# Patient Record
Sex: Female | Born: 1945
Health system: Southern US, Community
[De-identification: ages and names within clinical notes are randomized; demographics above are authoritative.]

## PROBLEM LIST (undated history)

## (undated) DIAGNOSIS — R011 Cardiac murmur, unspecified: Secondary | ICD-10-CM

## (undated) DIAGNOSIS — M1990 Unspecified osteoarthritis, unspecified site: Secondary | ICD-10-CM

## (undated) DIAGNOSIS — T7840XA Allergy, unspecified, initial encounter: Secondary | ICD-10-CM

## (undated) DIAGNOSIS — E785 Hyperlipidemia, unspecified: Secondary | ICD-10-CM

## (undated) DIAGNOSIS — I10 Essential (primary) hypertension: Secondary | ICD-10-CM

## (undated) DIAGNOSIS — F419 Anxiety disorder, unspecified: Secondary | ICD-10-CM

## (undated) HISTORY — PX: ABDOMINAL HYSTERECTOMY: SHX81

## (undated) HISTORY — DX: Allergy, unspecified, initial encounter: T78.40XA

## (undated) HISTORY — DX: Essential (primary) hypertension: I10

## (undated) HISTORY — PX: CATARACT EXTRACTION, BILATERAL: SHX1313

## (undated) HISTORY — DX: Hyperlipidemia, unspecified: E78.5

## (undated) HISTORY — DX: Cardiac murmur, unspecified: R01.1

## (undated) HISTORY — PX: COLONOSCOPY: SHX174

---

## 1998-09-08 ENCOUNTER — Encounter: Payer: Self-pay | Admitting: Family Medicine

## 1998-09-08 ENCOUNTER — Ambulatory Visit (HOSPITAL_COMMUNITY): Admission: RE | Admit: 1998-09-08 | Discharge: 1998-09-08 | Payer: Self-pay | Admitting: Family Medicine

## 1998-12-19 ENCOUNTER — Ambulatory Visit (HOSPITAL_COMMUNITY): Admission: RE | Admit: 1998-12-19 | Discharge: 1998-12-19 | Payer: Self-pay | Admitting: Family Medicine

## 1998-12-20 ENCOUNTER — Encounter: Payer: Self-pay | Admitting: Family Medicine

## 1999-01-25 ENCOUNTER — Ambulatory Visit (HOSPITAL_COMMUNITY): Admission: RE | Admit: 1999-01-25 | Discharge: 1999-01-25 | Payer: Self-pay | Admitting: Obstetrics and Gynecology

## 2000-02-07 ENCOUNTER — Encounter: Payer: Self-pay | Admitting: Family Medicine

## 2000-02-07 ENCOUNTER — Encounter: Admission: RE | Admit: 2000-02-07 | Discharge: 2000-02-07 | Payer: Self-pay | Admitting: Family Medicine

## 2000-09-02 HISTORY — PX: OTHER SURGICAL HISTORY: SHX169

## 2000-09-02 HISTORY — PX: PARTIAL HYSTERECTOMY: SHX80

## 2000-09-10 ENCOUNTER — Encounter (INDEPENDENT_AMBULATORY_CARE_PROVIDER_SITE_OTHER): Payer: Self-pay

## 2000-09-10 ENCOUNTER — Inpatient Hospital Stay (HOSPITAL_COMMUNITY): Admission: RE | Admit: 2000-09-10 | Discharge: 2000-09-12 | Payer: Self-pay | Admitting: Obstetrics and Gynecology

## 2001-02-26 ENCOUNTER — Encounter: Payer: Self-pay | Admitting: Family Medicine

## 2001-02-26 ENCOUNTER — Encounter: Admission: RE | Admit: 2001-02-26 | Discharge: 2001-02-26 | Payer: Self-pay | Admitting: Family Medicine

## 2002-03-09 ENCOUNTER — Encounter: Payer: Self-pay | Admitting: Family Medicine

## 2002-03-09 ENCOUNTER — Encounter: Admission: RE | Admit: 2002-03-09 | Discharge: 2002-03-09 | Payer: Self-pay | Admitting: Family Medicine

## 2003-05-25 ENCOUNTER — Encounter: Admission: RE | Admit: 2003-05-25 | Discharge: 2003-05-25 | Payer: Self-pay | Admitting: Obstetrics and Gynecology

## 2003-05-25 ENCOUNTER — Encounter: Payer: Self-pay | Admitting: Family Medicine

## 2003-05-25 ENCOUNTER — Encounter: Admission: RE | Admit: 2003-05-25 | Discharge: 2003-05-25 | Payer: Self-pay | Admitting: Family Medicine

## 2003-05-25 ENCOUNTER — Encounter: Payer: Self-pay | Admitting: Obstetrics and Gynecology

## 2005-02-28 ENCOUNTER — Encounter: Admission: RE | Admit: 2005-02-28 | Discharge: 2005-02-28 | Payer: Self-pay | Admitting: Family Medicine

## 2006-04-04 ENCOUNTER — Encounter: Admission: RE | Admit: 2006-04-04 | Discharge: 2006-04-04 | Payer: Self-pay | Admitting: Family Medicine

## 2007-05-05 ENCOUNTER — Encounter: Admission: RE | Admit: 2007-05-05 | Discharge: 2007-05-05 | Payer: Self-pay | Admitting: Obstetrics and Gynecology

## 2008-05-17 ENCOUNTER — Encounter: Admission: RE | Admit: 2008-05-17 | Discharge: 2008-05-17 | Payer: Self-pay | Admitting: Family Medicine

## 2008-05-26 ENCOUNTER — Ambulatory Visit (HOSPITAL_BASED_OUTPATIENT_CLINIC_OR_DEPARTMENT_OTHER): Admission: RE | Admit: 2008-05-26 | Discharge: 2008-05-26 | Payer: Self-pay | Admitting: Orthopedic Surgery

## 2008-06-21 ENCOUNTER — Encounter (INDEPENDENT_AMBULATORY_CARE_PROVIDER_SITE_OTHER): Payer: Self-pay | Admitting: Orthopedic Surgery

## 2008-06-21 ENCOUNTER — Ambulatory Visit (HOSPITAL_BASED_OUTPATIENT_CLINIC_OR_DEPARTMENT_OTHER): Admission: RE | Admit: 2008-06-21 | Discharge: 2008-06-21 | Payer: Self-pay | Admitting: Orthopedic Surgery

## 2010-10-10 LAB — HM COLONOSCOPY

## 2010-11-08 ENCOUNTER — Other Ambulatory Visit: Payer: Self-pay | Admitting: Family Medicine

## 2010-11-08 ENCOUNTER — Ambulatory Visit (INDEPENDENT_AMBULATORY_CARE_PROVIDER_SITE_OTHER): Payer: 59 | Admitting: Family Medicine

## 2010-11-08 ENCOUNTER — Encounter: Payer: Self-pay | Admitting: Family Medicine

## 2010-11-08 DIAGNOSIS — I10 Essential (primary) hypertension: Secondary | ICD-10-CM

## 2010-11-08 DIAGNOSIS — E785 Hyperlipidemia, unspecified: Secondary | ICD-10-CM | POA: Insufficient documentation

## 2010-11-08 DIAGNOSIS — J309 Allergic rhinitis, unspecified: Secondary | ICD-10-CM | POA: Insufficient documentation

## 2010-11-08 LAB — BASIC METABOLIC PANEL
BUN: 12 mg/dL (ref 6–23)
CO2: 33 mEq/L — ABNORMAL HIGH (ref 19–32)
Calcium: 9.4 mg/dL (ref 8.4–10.5)
Chloride: 96 mEq/L (ref 96–112)
Creatinine, Ser: 0.8 mg/dL (ref 0.4–1.2)
GFR: 82.49 mL/min (ref >60.00)
Glucose, Bld: 96 mg/dL (ref 70–99)
Potassium: 3 mEq/L — ABNORMAL LOW (ref 3.5–5.1)
Sodium: 139 mEq/L (ref 135–145)

## 2010-11-08 LAB — LIPID PANEL
Cholesterol: 159 mg/dL (ref 0–200)
HDL: 43.1 mg/dL (ref >39.00)
LDL Cholesterol: 89 mg/dL (ref 0–99)
Total CHOL/HDL Ratio: 4
Triglycerides: 134 mg/dL (ref 0.0–149.0)
VLDL: 26.8 mg/dL (ref 0.0–40.0)

## 2010-11-08 LAB — HEPATIC FUNCTION PANEL
ALT: 20 U/L (ref 0–35)
AST: 23 U/L (ref 0–37)
Albumin: 4.3 g/dL (ref 3.5–5.2)
Alkaline Phosphatase: 82 U/L (ref 39–117)
Bilirubin, Direct: 0.1 mg/dL (ref 0.0–0.3)
Total Bilirubin: 0.8 mg/dL (ref 0.3–1.2)
Total Protein: 7.7 g/dL (ref 6.0–8.3)

## 2010-11-13 NOTE — Assessment & Plan Note (Signed)
Summary: NEW PT TO EST/REFILL MED/CLE  [employer],MAILED NPP   Vital Signs:  Patient profile:   65 year old female Height:      61 inches Weight:      127.75 pounds BMI:     24.23 Temp:     97.8 degrees F oral Pulse rate:   77 / minute Pulse rhythm:   regular BP sitting:   140 / 80  (right arm) Cuff size:   regular  Vitals Entered By: Linde Gillis CMA Duncan Dull) 11/26/10 1:50 PM) CC: new patient, establish care   History of Present Illness: 65 yo here to establish care, needs medications refilled.  HTN- Has been on HCTZ 25 mg and Norvasc 5 mg daily for sevearl months.  Also takes Klor Con 20 meq. Denies any CP, SOB, LE edema, blurred vision.  HLD- on lipitor 20 mg daily.  No myalgias or other noticible side effects.    Preventive Screening-Counseling & Management  Alcohol-Tobacco     Smoking Status: never      Drug Use:  no.    Current Medications (verified): 1)  Lipitor 20 Mg Tabs (Atorvastatin Calcium) .... Take One Tablet By Mouth Daily 2)  Norvasc 5 Mg Tabs (Amlodipine Besylate) .... Take One Tablet By Mouth Daily 3)  Klor-Con 20 Meq Pack (Potassium Chloride) .... Take One Tablet By Mouth Daily 4)  Hydrochlorothiazide 25 Mg Tabs (Hydrochlorothiazide) .... Take One Tablet By Mouth Daily 5)  Zyrtec Allergy 10 Mg Tabs (Cetirizine Hcl) .... Take One Tablet By Mouth Daily 6)  Centrum Silver  Tabs (Multiple Vitamins-Minerals) .... Take One Tablet By Mouth Daily  Allergies (verified): 1)  ! * Lisinopril  Past History:  Family History: Last updated: 11-26-2010 Brother died of MI Family History Hypertension  Social History: Last updated: 11-26-10 Married One daughter Never Smoked Alcohol use-no Drug use-no  Risk Factors: Smoking Status: never (11/26/10)  Past Medical History: Allergic rhinitis Hyperlipidemia Hypertension  Past Surgical History: Hysterectomy 2002  Family History: Brother died of MI Family History Hypertension  Social  History: Married One daughter Never Smoked Alcohol use-no Drug use-no Smoking Status:  never Drug Use:  no  Review of Systems      See HPI General:  Denies malaise. Eyes:  Denies blurring. ENT:  Denies difficulty swallowing. CV:  Denies chest pain or discomfort. Resp:  Denies shortness of breath. GI:  Denies abdominal pain, bloody stools, and change in bowel habits. GU:  Denies dysuria. MS:  Denies joint pain, joint redness, and joint swelling. Derm:  Denies rash. Neuro:  Denies headaches. Psych:  Denies anxiety and depression. Endo:  Denies cold intolerance and heat intolerance. Heme:  Denies abnormal bruising and bleeding.   Impression & Recommendations:  Problem # 1:  HYPERTENSION (ICD-401.9) Assessment Deteriorated A little elevated today but rushed to get here. Brother also died recently, per pt typically runs in 120s/60s. BMET today. Her updated medication list for this problem includes:    Norvasc 5 Mg Tabs (Amlodipine besylate) .Marland Kitchen... Take one tablet by mouth daily    Hydrochlorothiazide 25 Mg Tabs (Hydrochlorothiazide) .Marland Kitchen... Take one tablet by mouth daily  Orders: TLB-BMP (Basic Metabolic Panel-BMET) (80048-METABOL)  Problem # 2:  HYPERLIPIDEMIA (ICD-272.4) Assessment: Unchanged recheck FLP and hepatic panel today. Continue lipitor 20 mg daily. Her updated medication list for this problem includes:    Lipitor 20 Mg Tabs (Atorvastatin calcium) .Marland Kitchen... Take one tablet by mouth daily  Orders: Venipuncture (28413) TLB-Lipid Panel (80061-LIPID) TLB-Hepatic/Liver Function Pnl (80076-HEPATIC)  Problem # 3:  ALLERGIC RHINITIS (ICD-477.9) Assessment: Unchanged stable on Zyrtec 10 mg daily. Her updated medication list for this problem includes:    Zyrtec Allergy 10 Mg Tabs (Cetirizine hcl) .Marland Kitchen... Take one tablet by mouth daily  Complete Medication List: 1)  Lipitor 20 Mg Tabs (Atorvastatin calcium) .... Take one tablet by mouth daily 2)  Norvasc 5 Mg Tabs  (Amlodipine besylate) .... Take one tablet by mouth daily 3)  Klor-con 20 Meq Pack (Potassium chloride) .... Take one tablet by mouth daily 4)  Hydrochlorothiazide 25 Mg Tabs (Hydrochlorothiazide) .... Take one tablet by mouth daily 5)  Zyrtec Allergy 10 Mg Tabs (Cetirizine hcl) .... Take one tablet by mouth daily 6)  Centrum Silver Tabs (Multiple vitamins-minerals) .... Take one tablet by mouth daily Prescriptions: KLOR-CON 20 MEQ PACK (POTASSIUM CHLORIDE) take one tablet by mouth daily  #90 x 4   Entered and Authorized by:   Ruthe Mannan MD   Signed by:   Ruthe Mannan MD on 11/08/2010   Method used:   Print then Give to Patient   RxID:   [id number] HYDROCHLOROTHIAZIDE 25 MG TABS (HYDROCHLOROTHIAZIDE) take one tablet by mouth daily  #90 x 4   Entered and Authorized by:   Ruthe Mannan MD   Signed by:   Ruthe Mannan MD on 11/08/2010   Method used:   Print then Give to Patient   RxID:   [id number] LIPITOR 20 MG TABS (ATORVASTATIN CALCIUM) take one tablet by mouth daily  #90 x 4   Entered and Authorized by:   Ruthe Mannan MD   Signed by:   Ruthe Mannan MD on 11/08/2010   Method used:   Print then Give to Patient   RxID:   [id number] NORVASC 5 MG TABS (AMLODIPINE BESYLATE) take one tablet by mouth daily  #90 x 4   Entered and Authorized by:   Ruthe Mannan MD   Signed by:   Ruthe Mannan MD on 11/08/2010   Method used:   Print then Give to Patient   RxID:   [id number]    Orders Added: 1)  Venipuncture [42595] 2)  TLB-Lipid Panel [80061-LIPID] 3)  TLB-BMP (Basic Metabolic Panel-BMET) [80048-METABOL] 4)  TLB-Hepatic/Liver Function Pnl [80076-HEPATIC] 5)  New Patient Level III [63875]    Current Allergies (reviewed today): ! * LISINOPRIL  Colonoscopy Next Due:  Refused PAP Next Due:  Not Indicated

## 2011-01-15 NOTE — Op Note (Signed)
Robin Peck, Robin Peck               ACCOUNT NO.:  [id number]   MEDICAL RECORD NO.:  [id number]          PATIENT TYPE:  AMB   LOCATION:  DSC                          FACILITY:  [facility]   PHYSICIAN:  Katy Fitch. Sypher, M.D. DATE OF BIRTH:  03-Feb-1946   DATE OF PROCEDURE:  06/21/2008  DATE OF DISCHARGE:                               OPERATIVE REPORT   PREOPERATIVE DIAGNOSES:  Right wrist pain with MRI-proven ulnocarpal  abutment, triangular fibrocartilage tear, and unexpected finding of a  soft tissue tumor in the deep palmar space paralleling the bases of the  long and ring finger metacarpals.  The radiologist was concerned this  could represent some type of histiofibroma or possible nerve element  tumor.   POSTOP DIAGNOSIS:  Nerve element tumor, diagnosis uncertain.   OPERATION:  Subtotal resection for incisional biopsy of right deep ulnar  motor branch mass.   OPERATING SURGEON:  Katy Fitch. Sypher, MD   ASSISTANT:  Marveen Reeks Dasnoit, PA   ANESTHESIA:  General by LMA.   SUPERVISING ANESTHESIOLOGIST:  Burna Forts, MD   INDICATIONS:  Abrar Koone is a 65 year old woman who referred for  evaluation and management of hand pain and wrist pain on the right.  Clinical examination revealed that she had a prior carpal tunnel release  by Dr. Otelia Sergeant that had relieved hand numbness but did not relieve her  pain.  She was noted to have signs of ulnocarpal abutment and carpal  pain.  She was sent for MRI, which documented signs of ulnocarpal  abutment with a triangular fibrocartilage tear and edema in the distal  ulna and lunate.  An unexpected finding was a tumor in the deep palmar  space.  The radiologist interpreting the films, was concerned this could  represent a nerve element tumor or some type of other soft tissue tumor.  The radiologist could not rule out a malignant disease.   Due to a failure to relieve her pain and in an effort to identify the  histopathologic nature of her  tumor, she is brought to the operating at  this time.   We had initially planned to proceed with a three stage procedure if the  mass in the palm was found to be a benign process, such as a schwannoma  or giant cell tumor.   We were prepared to proceed with an excisional biopsy of the palmar  mass, a wrist arthroscopy, and a possible ulnar shortening.  If we were  uncertain about the histopathology of the palmar mass, our goal was to  abort the additional procedures and wait for histopathologic  identification of the neoplastic process.  Ms. Inga is brought to the  operating room at this time anticipating intervention as described.   PROCEDURE:  [name] was brought to the operating room and  placed in a supine position on the operating table.   Following the anesthesia consult with Dr. Jacklynn Bue, general anesthesia  by LMA technique was recommended and accepted.  Ms. Helbert was brought  to the room 1, placed in supine position on the operating table, and  under  Dr. Marlane Mingle direct supervision general anesthesia by LMA  technique induced.   The right upper extremity was prepped with Betadine soap solution and  sterilely draped.  A pneumatic tourniquet was applied to the proximal  right brachium.   Following exsanguination of the right arm with Esmarch bandage, arterial  tourniquet was inflated to 220 mmHg.  The procedure commenced with an  extended carpal tunnel-type incision paralleling the thenar crease  moving towards the middle palmar crease.  Subcutaneous tissues were  carefully divided taking care to identify the distal margin of the  transcarpal ligament and the superficial palmar arch.  The superficial  palmar arch was gently retracted followed by release of the distal  centimeter of the transverse carpal ligament along its ulnar border.  The flexor tendons were retracted in a radial direction followed by  palpation of the floor of the deep palmar space.  The  deep palmar arch  was identified and protected.  The mass was palpable.  After careful  dissection around the common digital vessels of the deep arch, I  circumferentially dissected the mass.  This appeared to be growing  directly in the deep motor branch of the ulnar nerve.  This was quite  scirrhous, firm, and irregular.  A nerve stimulator was used as a  stimulus of 0.5-2 amps with the grounding in the thenar muscles.  We  mapped the mass as carefully as possible identifying the region that  caused movement of the adductor pollicis and first dorsal interosseous  muscles.  We could not identify the palmar interossei.  We ultimately  performed a wedge resection approximately 1/4-1/3 of the volume of the  mass in an area that appeared be silent with the nerve stimulator.   This was placed in formalin and passed off for pathologic evaluation.   If this is a neurofibroma or schwannoma, we will abort further attempts  at resection.   If this is a sarcomatous process or other malignancy, we will need to  make a referral for appropriate care at a [facility]   At this point, the tourniquet was released and hemostasis was achieved.  The wound was repaired with a intradermal 3-0 Prolene and a compressive  dressing with a volar plaster splint.   At a lengthy postoperative consult with Mr. Skibinski, explaining our  findings.  I pointed out that we will not proceed until we have a clear  understanding of the histopathologic process identified.  Questions were  invited and answered in detail.      Katy Fitch Sypher, M.D.  Electronically Signed     RVS/MEDQ  D:  06/21/2008  T:  06/22/2008  Job:  [id number]   cc:   Marjory Lies, M.D.

## 2011-01-15 NOTE — Op Note (Signed)
Robin Peck, AMODEI               ACCOUNT NO.:  [id number]   MEDICAL RECORD NO.:  [id number]          PATIENT TYPE:  AMB   LOCATION:  DSC                          FACILITY:  [facility]   PHYSICIAN:  Katy Fitch. Sypher, M.D. DATE OF BIRTH:  30-May-1946   DATE OF PROCEDURE:  05/26/2008  DATE OF DISCHARGE:                               OPERATIVE REPORT   PREOPERATIVE DIAGNOSIS:  Chronic entrapped neuropathy, left median nerve  at carpal tunnel.   POSTOPERATIVE DIAGNOSIS:  Chronic entrapped neuropathy, left median  nerve at carpal tunnel.   OPERATIONS:  Release of left transcarpal ligament.   OPERATING SURGEON:  Katy Fitch. Sypher, MD   ASSISTANT:  Marveen Reeks Dasnoit, PA-C   ANESTHESIA:  General by LMA.   SUPERVISING ANESTHESIOLOGIST:  Dr. Jean Rosenthal.   INDICATIONS:  Davetta Olliff is a 65 year old retired woman who presented  for evaluation and management of left hand numbness and right wrist  pain.   She has had prior right carpal tunnel release by Dr. Otelia Sergeant with good  resolution of her right hand numbness.  She was having ulnar-sided right  wrist pain.  Clinical examination revealed signs of ulnocarpal abutment  on the right.   Clinical examination confirmed persistent carpal tunnel syndrome on the  left.  She has had electrodiagnostic studies confirming left median  neuropathy at wrist level.   An MRI of the right wrist confirmed ulnocarpal above the findings and an  unexpected finding of a mass in the mid palmar space.   We anticipate proceeding with excisional biopsy of the palmar mass and a  dressing of the right ulnocarpal abutment in the near term.  Ms. Pleitez  was presented with all this information and elected to proceed with left  carpal tunnel release as her initial procedure.   After informed consent, she is brought to the operating room at this  time.   PROCEDURE:  [name] is brought to the operating room and placed  in supine position on the table.  Following  the induction of general  anesthesia by LMA technique, the left arm was prepped with Betadine soap  and solution and sterilely draped.  A pneumatic tourniquet was applied  to the proximal brachium.   Following exsanguination of the left arm with an Esmarch bandage, the  arterial tourniquet was inflated to 220 mmHg.  The procedure commenced  with a short incision in the line of ring finger of the palm.  Subcutaneous tissues were carefully divided revealing the palmar fascia.  This split longitudinally to the common sensory branch of the median  nerve.   These were followed back to the transcarpal ligament, which was gently  isolated with median nerve.   The ligament was released along its ulnar border extending into the  distal forearm.  This widely opened carpal canal.  There were no masses  or predicaments noted.   Bleeding points along the margin of the released ligament were  electrocauterized followed by repair of the skin with intradermal 3-0  Prolene suture.   A compressive dressing was applied with a volar plaster splint  maintaining  the wrist in 5 degrees of dorsiflexion.   For aftercare, Ms. Lamson is provided prescription for Percocet 5 mg one  p.o. q.4-6 h. p.r.n. pain 20 tablets without refill.   I will see her back for followup in the office in a week for dressing  change and possible suture removal.      Katy Fitch. Sypher, M.D.  Electronically Signed     RVS/MEDQ  D:  05/26/2008  T:  05/27/2008  Job:  [id number]   cc:   Marjory Lies, M.D.

## 2011-01-18 NOTE — Discharge Summary (Signed)
[facility]  Patient:    Robin Peck, Robin Peck                      MRN: [id number] Adm. Date:  [id number] Disc. Date: [id number] Attending:  Lendon Colonel CC:         Viann Fish, M.D.   Discharge Summary  ADMISSION DIAGNOSES: 1. Uterine fibroids with excessive menstruation. 2. History of anemia. 3. Benign essential hypertension.  DISCHARGE DIAGNOSES: 1. Uterine fibroids with excessive menstruation. 2. History of anemia. 3. Benign essential hypertension. 4. Pelvic endometriosis.  BRIEF HISTORY:  [name] is a 65 year old female who continues to menstruate with heavy periods, unresponsive to conservative therapy, and a known uterine fibroid. She has opted for hysterectomy.  ADMISSION LABORATORY AND ACCESSORY DATA:  Hemoglobin 12.4, hematocrit 34. Electrolytes on admission were normal.  The patient had a normal sinus rhythm with frequent PVCs.  Pathology report revealed peritoneal pelvic endometriosis along with uterine fibroids.  Total uterine weight was 284 grams.  HOSPITAL COURSE: The patient was admitted to the hospital, underwent an uneventful hysterectomy with excision of pelvic endometriosis.  At the time of surgery, she had a vagal response with a heart rate drop down to about 24 which responded to IV Atropine and Robinul.  In the recovery room, she had numerous PVCs which were usual for her, however, it was decided by anesthesia without notification of attending that she should be monitored and she was sent to Telemetry bed, again, without notification of the attending physician. She was monitored on the third floor without difficulty.  She had multifocal PVCs which were normal for her and found to have a normal potassium level, and a normal magnesium level as ordered by the cardiologist.  Her postoperative course was uncomplicated.  She was discharged on January 11 to home in office care.  DISCHARGE INSTRUCTIONS:  She will  continue her medication and followup therapy with [place] in the office in one week.  She is to call us for excessive fever, bleeding, or any other problems she may encounter.  DISCHARGE MEDICATIONS: She is to take Percocet for pain.  CONDITION ON DISCHARGE:  Improved. DD:  09/18/00 TD:  09/20/00 Job: [id number]

## 2011-01-18 NOTE — H&P (Signed)
[facility]  Patient:    Robin Peck, Robin Peck                        MRN: [id number] Adm. Date:  09/10/00 Attending:  Katherine Roan, M.D.                         History and Physical  CHIEF COMPLAINT:  Continued heavy painful period.  HISTORY OF PRESENT ILLNESS:  The patient is a 65 year old gravida 1 female with known uterine fibroids who continues to complain of heavy periods.  She is status post hysteroscopy with resection of a large uterine fibroid and has been followed for heavy periods and anemia.  Because of the persistent heavy periods, she has decided to proceed with hysterectomy.  Uterine size is about 10 to 12 weeks.  PAST MEDICAL HISTORY:  Tubal ligation in the 1980s, hysteroscopy last year and an abscess in her neck in 1976.  MEDICATIONS:  She currently takes Prinivil 5 mg daily for high blood pressure and she also takes iron.  ALLERGIES:  She has no known allergies.  REVIEW OF SYSTEMS:  HEENT:  She wears glasses but no decrease in visual or auditory acuity.  No headaches or dizziness.  HEART:  She has palpitations and has been followed for hypertension by Dr. Lacretia Nicks. Viann Fish, Montez Hageman.  She denies chest pain or shortness of breath.  No history of heart murmur or mitral valve prolapse.  LUNGS:  No cough.  No night sweats.  No hemoptysis.  No history of asthma.  GU:  No frequency.  No stress incontinence.  No history of nephritis. GASTROINTESTINAL:  No history of weight loss or gain.  No anorexia.  No history of melena or bowel habit change.  No indigestion.  MUSCLES, BONES AND JOINTS:  No fractures or arthritis.  SOCIAL HISTORY:  She works at [employer]; she is an Paediatric nurse.  FAMILY HISTORY:  Her mother died at 24.  Her father is a living and well at 63.  She has three brothers and three sisters all in good health.  No cancer in the family.  Her mother had a stroke and had high blood pressure.  Her father has hypertension.  No diabetes in  the family.  PHYSICAL EXAMINATION  GENERAL:  Examination reveals a well-developed, nourished female who is alert and oriented to time, place and recent events.  She appears to be her stated age.  Weight -- 133 pounds.  VITAL SIGNS:  Blood pressure 140/80.  HEENT:  Examination of the ears, nose and throat is unremarkable.  Oropharynx is not injected.  NECK:  Supple.  Thyroid is not enlarged.  Carotid pulses are equal without adenopathy.  BREASTS:  No masses or tenderness.  LUNGS:  Clear to P&A.  Diaphragms move well with inspiration and expiration.  HEART:  Normal sinus rhythm.  No heaves, thrills, rubs or gallop.  No cardiomegaly.  BREASTS:  No masses or tenderness.  ABDOMEN:  Soft.  Liver, spleen and kidneys are not palpated.  Bowel sounds are normal.  No palpable masses are noted.  No tenderness.  No bruits are heard.  EXTREMITIES:  Good range of motion, equal pulses and reflexes.  PELVIC:  Normal vulva and vagina.  The cervix is clean.  The uterus is anterior, about three times normal size and irregular.  Adnexa negative. RECTAL:  Rectovaginal confirms.  Hemoccult is negative.  IMPRESSION:  Large uterine fibroids, symptomatic.  PLAN:  Total abdominal hysterectomy with ovarian preservation.  Detailed informed consent has been given to this patient. DD:  09/10/00 TD:  09/10/00 Job: [id number]

## 2011-01-18 NOTE — Op Note (Signed)
[facility]  Patient:    [name], Robin Peck                        MRN: [id number] Proc. Date: 09/10/00 Attending:  Katherine Roan, M.D.                           Operative Report  PREOPERATIVE DIAGNOSES:  Severe menorrhagia and dysmenorrhea persistent, uterine fibroids.  POSTOPERATIVE DIAGNOSES:  Severe menorrhagia and dysmenorrhea persistent, uterine fibroids, endometriosis.  OPERATION PERFORMED:  Total abdominal hysterectomy, excision of peritoneal endometriosis.  DESCRIPTION OF PROCEDURE:  The patient was placed in lithotomy position, prepped and draped in the usual fashion. The transverse incision was made in the abdomen, hemostasis was accomplished with the Bovie.  Exploration of the abdomen was then entered through a vertical midline in the peritoneum and the exploration of the upper abdomen revealed 2 normal kidneys. The left kidney was a little bit larger than the right. The liver and gallbladder appeared to be normal. There was no significant periaortic adenopathy. The abdominal viscera were then packed away from the pelvic viscera. The uterus was about 2-3 times normal size, symmetrical and had a large fundal fibroid. There was scarring and thickening in the cul-de-sac suggestive of endometriosis and there were three black lesions that were noted. One was excised, the others were cauterized. The utero-ovarian anastomosis was transected and ligated along with the round ligament, suture ligated with #0 chromic suture. A bladder flap was created and the uterine vessels skeletonized and ligated with #0 chromic suture. The fundectomy was then performed and the cervix was removed, cervical stump measured probably about 3 cm. The vagina was closed with horizontal mattress suture of #0 chromic and figure-of-eight sutures of #0 Vicryl. Hemostasis in the right side of the vaginal vault was obtained using 3-0 Vicryl. Hemostasis was perfect at the  termination of the procedure. Both ovaries were normal. The parietoperitoneum was then closed with 2-0 PDS after sponge and needle count was verified to be correct and the fascia was closed with a running locking suture of 2-0 PDS. Interrupted sutures of #0 Vicryl were used in the mid portion of the incisions on each side of the midline. Following this, the incision was irrigated and enclosed with clips. I then infiltrated the incision with about 20 cc of 0.5% Marcaine with epinephrine. Synetta Fail tolerated this procedure well and was sent to the recovery room in good condition. DD:  09/10/00 TD:  09/10/00 Job: [id number]

## 2011-06-03 LAB — BASIC METABOLIC PANEL
BUN: 12
CO2: 31
Calcium: 9.4
Chloride: 102
Creatinine, Ser: 0.76
GFR calc Af Amer: >60
GFR calc non Af Amer: >60
Glucose, Bld: 105 — ABNORMAL HIGH
Potassium: 3.9
Sodium: 139

## 2011-06-03 LAB — POCT HEMOGLOBIN-HEMACUE: Hemoglobin: 12.6

## 2011-06-04 LAB — POCT HEMOGLOBIN-HEMACUE: Hemoglobin: 12.3

## 2011-06-04 LAB — BASIC METABOLIC PANEL
BUN: 13
CO2: 32
Calcium: 9.4
Chloride: 102
Creatinine, Ser: 0.71
GFR calc Af Amer: >60
GFR calc non Af Amer: >60
Glucose, Bld: 105 — ABNORMAL HIGH
Potassium: 3.1 — ABNORMAL LOW
Sodium: 139

## 2011-11-24 ENCOUNTER — Other Ambulatory Visit: Payer: Self-pay | Admitting: Family Medicine

## 2011-12-04 ENCOUNTER — Encounter: Payer: Self-pay | Admitting: Family Medicine

## 2011-12-05 ENCOUNTER — Encounter: Payer: Self-pay | Admitting: Family Medicine

## 2011-12-05 ENCOUNTER — Ambulatory Visit (INDEPENDENT_AMBULATORY_CARE_PROVIDER_SITE_OTHER): Payer: 59 | Admitting: Family Medicine

## 2011-12-05 VITALS — BP 140/70 | HR 72 | Temp 97.7°F | Wt 130.0 lb

## 2011-12-05 DIAGNOSIS — E785 Hyperlipidemia, unspecified: Secondary | ICD-10-CM

## 2011-12-05 DIAGNOSIS — I10 Essential (primary) hypertension: Secondary | ICD-10-CM

## 2011-12-05 LAB — COMPREHENSIVE METABOLIC PANEL
ALT: 18 U/L (ref 0–35)
AST: 21 U/L (ref 0–37)
Albumin: 4.1 g/dL (ref 3.5–5.2)
Alkaline Phosphatase: 76 U/L (ref 39–117)
BUN: 13 mg/dL (ref 6–23)
CO2: 33 mEq/L — ABNORMAL HIGH (ref 19–32)
Calcium: 9.6 mg/dL (ref 8.4–10.5)
Chloride: 95 mEq/L — ABNORMAL LOW (ref 96–112)
Creatinine, Ser: 0.7 mg/dL (ref 0.4–1.2)
GFR: 83.5 mL/min (ref >60.00)
Glucose, Bld: 105 mg/dL — ABNORMAL HIGH (ref 70–99)
Potassium: 3.1 mEq/L — ABNORMAL LOW (ref 3.5–5.1)
Sodium: 137 mEq/L (ref 135–145)
Total Bilirubin: 0.7 mg/dL (ref 0.3–1.2)
Total Protein: 8.2 g/dL (ref 6.0–8.3)

## 2011-12-05 LAB — LIPID PANEL
Cholesterol: 149 mg/dL (ref 0–200)
HDL: 41.5 mg/dL (ref >39.00)
LDL Cholesterol: 70 mg/dL (ref 0–99)
Total CHOL/HDL Ratio: 4
Triglycerides: 187 mg/dL — ABNORMAL HIGH (ref 0.0–149.0)
VLDL: 37.4 mg/dL (ref 0.0–40.0)

## 2011-12-05 MED ORDER — AMLODIPINE BESYLATE 5 MG PO TABS: ORAL_TABLET | ORAL | Status: DC

## 2011-12-05 MED ORDER — ATORVASTATIN CALCIUM 20 MG PO TABS: 20.0000 mg | ORAL_TABLET | Freq: Every day | ORAL | Status: DC

## 2011-12-05 MED ORDER — HYDROCHLOROTHIAZIDE 25 MG PO TABS: ORAL_TABLET | ORAL | Status: DC

## 2011-12-05 NOTE — Patient Instructions (Signed)
Your triglycerides are probably due to genetics.  But you can try to decrease added sugars, eliminate trans fats, increase fiber and limit alcohol.   We will call you with your lab results.

## 2011-12-05 NOTE — Progress Notes (Signed)
66 yo here for medication refill.  HTN- Has been on HCTZ 25 mg and Norvasc 5 mg daily for over a year. Also takes Klor Con 20 meq. Denies any CP, SOB, LE edema, blurred vision.  Lab Results  Component Value Date   CREATININE 0.8 11/08/2010   Lab Results  Component Value Date   NA 139 11/08/2010   K 3.0* 11/08/2010   CL 96 11/08/2010   CO2 33* 11/08/2010    HLD- on lipitor 20 mg daily. No myalgias or other noticible side effects.  Lab Results  Component Value Date   CHOL 159 11/08/2010   HDL 43.10 11/08/2010   LDLCALC 89 11/08/2010   TRIG 134.0 11/08/2010   CHOLHDL 4 11/08/2010   Patient Active Problem List  Diagnoses  . HYPERLIPIDEMIA  . HYPERTENSION  . ALLERGIC RHINITIS   Past Medical History  Diagnosis Date  . Allergic rhinitis   . Hyperlipidemia   . Hypertension    Past Surgical History  Procedure Date  . Gyn surgery 2002    hysterectomy   History  Substance Use Topics  . Smoking status: Never Smoker   . Smokeless tobacco: Not on file  . Alcohol Use: No   Family History  Problem Relation Age of Onset  . Heart attack Brother    Allergies  Allergen Reactions  . Lisinopril     REACTION: cough   Current Outpatient Prescriptions on File Prior to Visit  Medication Sig Dispense Refill  . cetirizine (ZYRTEC) 10 MG tablet Take 10 mg by mouth daily.      . potassium chloride SA (KLOR-CON M20) 20 MEQ tablet Take 20 mEq by mouth daily.      Marland Kitchen DISCONTD: atorvastatin (LIPITOR) 20 MG tablet Take 20 mg by mouth daily.      Marland Kitchen DISCONTD: hydrochlorothiazide (HYDRODIURIL) 25 MG tablet TAKE 1 TABLET BY MOUTH DAILY  30 tablet  0  . DISCONTD: NORVASC 5 MG tablet TAKE 1 TABLET BY MOUTH DAILY  30 tablet  0  . Multiple Vitamins-Minerals (CENTRUM SILVER PO) Take one by mouth daily       The PMH, PSH, Social History, Family History, Medications, and allergies have been reviewed in [facility], and have been updated if relevant.  ROS: See hpi Patient reports no  vision/ hearing changes,anorexia,  weight change, fever ,adenopathy, persistant / recurrent hoarseness, swallowing issues, chest pain, edema,persistant / recurrent cough, hemoptysis, dyspnea(rest, exertional, paroxysmal nocturnal), gastrointestinal  bleeding (melena, rectal bleeding), abdominal pain, excessive heart burn, GU symptoms(dysuria, hematuria, pyuria, voiding/incontinence  Issues) syncope, focal weakness, severe memory loss, concerning skin lesions, depression, anxiety, abnormal bruising/bleeding, major joint swelling, breast masses or abnormal vaginal bleeding.    Physical exam: BP 140/70  Pulse 72  Temp(Src) 97.7 F (36.5 C) (Oral)  Wt 130 lb (58.968 kg)  General:  Well-developed,well-nourished,in no acute distress; alert,appropriate and cooperative throughout examination Head:  normocephalic and atraumatic.   Eyes:  vision grossly intact, pupils equal, pupils round, and pupils reactive to light.   Lungs:  Normal respiratory effort, chest expands symmetrically. Lungs are clear to auscultation, no crackles or wheezes. Heart:  Normal rate and regular rhythm. S1 and S2 normal without gallop, murmur, click, rub or other extra sounds. Msk:  No deformity or scoliosis noted of thoracic or lumbar spine.   Extremities:  No clubbing, cyanosis, edema, or deformity noted with normal full range of motion of all joints.   Neurologic:  alert & oriented X3 and gait normal.   Skin:  Intact  without suspicious lesions or rashes Psych:  Cognition and judgment appear intact. Alert and cooperative with normal attention span and concentration. No apparent delusions, illusions, hallucinations  Assessment and Plan: 1. HYPERLIPIDEMIA  Stable, continue lipitor at current dose. Check lipid panel and hepatic panel today. Lipid Panel  2. HYPERTENSION  Stable, continue norvasc at current dose. Check CMET today. Comprehensive metabolic panel

## 2011-12-06 ENCOUNTER — Other Ambulatory Visit: Payer: Self-pay | Admitting: Family Medicine

## 2012-08-17 ENCOUNTER — Encounter: Payer: Self-pay | Admitting: Family Medicine

## 2012-08-17 ENCOUNTER — Ambulatory Visit (INDEPENDENT_AMBULATORY_CARE_PROVIDER_SITE_OTHER): Payer: 59 | Admitting: Family Medicine

## 2012-08-17 VITALS — BP 130/70 | HR 80 | Temp 97.9°F | Wt 125.0 lb

## 2012-08-17 DIAGNOSIS — L909 Atrophic disorder of skin, unspecified: Secondary | ICD-10-CM

## 2012-08-17 DIAGNOSIS — L918 Other hypertrophic disorders of the skin: Secondary | ICD-10-CM

## 2012-08-17 DIAGNOSIS — L919 Hypertrophic disorder of the skin, unspecified: Secondary | ICD-10-CM

## 2012-08-17 NOTE — Progress Notes (Signed)
Skin Tag Removal Procedure Note  Pre-operative Diagnosis: Classic skin tags (acrochordon)  Post-operative Diagnosis: Classic skin tags (acrochordon)  Locations:left neck   Patient Active Problem List  Diagnosis  . HYPERLIPIDEMIA  . HYPERTENSION  . ALLERGIC RHINITIS   Past Medical History  Diagnosis Date  . Allergic rhinitis   . Hyperlipidemia   . Hypertension    Past Surgical History  Procedure Date  . Gyn surgery 2002    hysterectomy   History  Substance Use Topics  . Smoking status: Never Smoker   . Smokeless tobacco: Not on file  . Alcohol Use: No   Family History  Problem Relation Age of Onset  . Heart attack Brother    Allergies  Allergen Reactions  . Lisinopril     REACTION: cough   Current Outpatient Prescriptions on File Prior to Visit  Medication Sig Dispense Refill  . amLODipine (NORVASC) 5 MG tablet TAKE 1 TABLET BY MOUTH DAILY  90 tablet  6  . cetirizine (ZYRTEC) 10 MG tablet Take 10 mg by mouth daily.      . hydrochlorothiazide (HYDRODIURIL) 25 MG tablet TAKE 1 TABLET BY MOUTH DAILY  90 tablet  6  . KLOR-CON M20 20 MEQ tablet TAKE 1 TABLET EVERY DAY  90 tablet  3  . LIPITOR 20 MG tablet TAKE 1 TABLET EVERY DAY  90 tablet  3  . Multiple Vitamins-Minerals (CENTRUM SILVER PO) Take one by mouth daily      . Omega-3 Fatty Acids (FISH OIL) 1200 MG CAPS Take one by mouth daily       The PMH, PSH, Social History, Family History, Medications, and allergies have been reviewed in [facility], and have been updated if relevant.  Indications: getting caught on jewelry and clothing.  Anesthesia: Lidocaine 1% with epinephrine   BP 130/70  Pulse 80  Temp 97.9 F (36.6 C)  Wt 125 lb (56.7 kg)  Procedure Details  The risks (including bleeding and infection) and benefits of the procedure and Verbal informed consent obtained. Using sterile iris scissors, multiple skin tags were snipped off at their bases after cleansing with Betadine.  Bleeding was controlled by  pressure.   Findings: Pathognomonic benign lesions  not sent for pathological exam.  Condition: Stable  Complications: none.  Plan: 1. Instructed to keep the wounds dry and covered for 24-48h and clean thereafter. 2. Warning signs of infection were reviewed.   3. Recommended that the patient use NSAID as needed for pain.  4. Return as needed.

## 2012-11-23 ENCOUNTER — Other Ambulatory Visit: Payer: Self-pay | Admitting: Family Medicine

## 2012-12-22 ENCOUNTER — Other Ambulatory Visit: Payer: Self-pay | Admitting: Family Medicine

## 2013-02-17 ENCOUNTER — Ambulatory Visit (INDEPENDENT_AMBULATORY_CARE_PROVIDER_SITE_OTHER): Payer: 59 | Admitting: Family Medicine

## 2013-02-17 ENCOUNTER — Encounter: Payer: Self-pay | Admitting: Family Medicine

## 2013-02-17 VITALS — BP 140/64 | HR 83 | Temp 97.9°F | Wt 126.0 lb

## 2013-02-17 DIAGNOSIS — E785 Hyperlipidemia, unspecified: Secondary | ICD-10-CM

## 2013-02-17 DIAGNOSIS — M47812 Spondylosis without myelopathy or radiculopathy, cervical region: Secondary | ICD-10-CM

## 2013-02-17 DIAGNOSIS — I10 Essential (primary) hypertension: Secondary | ICD-10-CM

## 2013-02-17 LAB — COMPREHENSIVE METABOLIC PANEL
ALT: 17 U/L (ref 0–35)
AST: 21 U/L (ref 0–37)
Albumin: 4.3 g/dL (ref 3.5–5.2)
Alkaline Phosphatase: 73 U/L (ref 39–117)
BUN: 17 mg/dL (ref 6–23)
CO2: 31 mEq/L (ref 19–32)
Calcium: 9.3 mg/dL (ref 8.4–10.5)
Chloride: 96 mEq/L (ref 96–112)
Creatinine, Ser: 0.8 mg/dL (ref 0.4–1.2)
GFR: 78.29 mL/min (ref >60.00)
Glucose, Bld: 109 mg/dL — ABNORMAL HIGH (ref 70–99)
Potassium: 3.1 mEq/L — ABNORMAL LOW (ref 3.5–5.1)
Sodium: 137 mEq/L (ref 135–145)
Total Bilirubin: 0.8 mg/dL (ref 0.3–1.2)
Total Protein: 8.2 g/dL (ref 6.0–8.3)

## 2013-02-17 LAB — LIPID PANEL
Cholesterol: 179 mg/dL (ref 0–200)
HDL: 47.9 mg/dL (ref >39.00)
LDL Cholesterol: 98 mg/dL (ref 0–99)
Total CHOL/HDL Ratio: 4
Triglycerides: 168 mg/dL — ABNORMAL HIGH (ref 0.0–149.0)
VLDL: 33.6 mg/dL (ref 0.0–40.0)

## 2013-02-17 MED ORDER — POTASSIUM CHLORIDE CRYS ER 20 MEQ PO TBCR: EXTENDED_RELEASE_TABLET | ORAL | Status: DC

## 2013-02-17 MED ORDER — HYDROCHLOROTHIAZIDE 25 MG PO TABS: ORAL_TABLET | ORAL | Status: DC

## 2013-02-17 MED ORDER — LIPITOR 20 MG PO TABS: ORAL_TABLET | ORAL | Status: DC

## 2013-02-17 MED ORDER — NORVASC 5 MG PO TABS: ORAL_TABLET | ORAL | Status: DC

## 2013-02-17 NOTE — Progress Notes (Signed)
67 yo pleasant female here for medication refill.  Overall has been doing very well.  HTN- Has been on HCTZ 25 mg and Norvasc 5 mg daily for over a year. Also takes Klor Con 20 meq. Denies any CP, SOB, LE edema, blurred vision.  Lab Results  Component Value Date   CREATININE 0.7 12/05/2011   Lab Results  Component Value Date   NA 137 12/05/2011   K 3.1* 12/05/2011   CL 95* 12/05/2011   CO2 33* 12/05/2011    HLD- on lipitor 20 mg daily. No myalgias or other noticible side effects.  Lab Results  Component Value Date   CHOL 149 12/05/2011   HDL 41.50 12/05/2011   LDLCALC 70 12/05/2011   TRIG 187.0* 12/05/2011   CHOLHDL 4 12/05/2011   She does have issues with cervical DJD.  Has found that occasional Ibuprofen with food (never more than 400 mg at a time) with monthly massage therapy is working well.   Patient Active Problem List   Diagnosis Date Noted  . HYPERLIPIDEMIA 11/08/2010  . HYPERTENSION 11/08/2010  . ALLERGIC RHINITIS 11/08/2010   Past Medical History  Diagnosis Date  . Allergic rhinitis   . Hyperlipidemia   . Hypertension    Past Surgical History  Procedure Laterality Date  . Gyn surgery  2002    hysterectomy   History  Substance Use Topics  . Smoking status: Never Smoker   . Smokeless tobacco: Not on file  . Alcohol Use: No   Family History  Problem Relation Age of Onset  . Heart attack Brother    Allergies  Allergen Reactions  . Lisinopril     REACTION: cough   Current Outpatient Prescriptions on File Prior to Visit  Medication Sig Dispense Refill  . cetirizine (ZYRTEC) 10 MG tablet Take 10 mg by mouth daily.      . Multiple Vitamins-Minerals (CENTRUM SILVER PO) Take one by mouth daily       No current facility-administered medications on file prior to visit.   The PMH, PSH, Social History, Family History, Medications, and allergies have been reviewed in [facility], and have been updated if relevant.  ROS: See hpi Patient reports no  vision/ hearing  changes,anorexia, weight change, fever ,adenopathy, persistant / recurrent hoarseness, swallowing issues, chest pain, edema,persistant / recurrent cough, hemoptysis, dyspnea(rest, exertional, paroxysmal nocturnal), gastrointestinal  bleeding (melena, rectal bleeding), abdominal pain, excessive heart burn, GU symptoms(dysuria, hematuria, pyuria, voiding/incontinence  Issues) syncope, focal weakness, severe memory loss, concerning skin lesions, depression, anxiety, abnormal bruising/bleeding, major joint swelling, breast masses or abnormal vaginal bleeding.    Physical exam: BP 140/64  Pulse 83  Temp(Src) 97.9 F (36.6 C)  Wt 126 lb (57.153 kg)  BMI 23.82 kg/m2  General:  Well-developed,well-nourished,in no acute distress; alert,appropriate and cooperative throughout examination Head:  normocephalic and atraumatic.   Eyes:  vision grossly intact, pupils equal, pupils round, and pupils reactive to light.   Lungs:  Normal respiratory effort, chest expands symmetrically. Lungs are clear to auscultation, no crackles or wheezes. Heart:  Normal rate and regular rhythm. S1 and S2 normal without gallop, murmur, click, rub or other extra sounds. Msk:  No deformity or scoliosis noted of thoracic or lumbar spine.   Extremities:  No clubbing, cyanosis, edema, or deformity noted with normal full range of motion of all joints.   Neurologic:  alert & oriented X3 and gait normal.   Skin:  Intact without suspicious lesions or rashes Psych:  Cognition and judgment appear intact.  Alert and cooperative with normal attention span and concentration. No apparent delusions, illusions, hallucinations  Assessment and Plan:  1. HYPERTENSION Stable on current rx.   Rx refilled. CMET today. - Comprehensive metabolic panel  2. HYPERLIPIDEMIA Continue Lipitor at current dose. Recheck lipid panel today. - Lipid Panel  3. DJD (degenerative joint disease) of cervical spine  Well controlled with conservative  management.

## 2013-02-17 NOTE — Patient Instructions (Addendum)
Good to see you. We will call you with your lab results or you can sign up for mychart.

## 2013-02-19 ENCOUNTER — Other Ambulatory Visit: Payer: Self-pay | Admitting: Family Medicine

## 2013-02-23 ENCOUNTER — Telehealth: Payer: Self-pay | Admitting: *Deleted

## 2013-02-23 NOTE — Telephone Encounter (Signed)
Yes I know she is taking 20 meq daily.  We could increase to 40 meq daily (double dose) but we have to be careful that her potassium does not get too high.  If there is not room to increase potassium in her diet, let's go ahead and double dose, come in for BMET in 1 week.

## 2013-02-23 NOTE — Telephone Encounter (Signed)
Advised patient of lab results, low potassium.  She is taking potassium supplement now, asks if she should increase her dose.  Please advise.

## 2013-02-23 NOTE — Telephone Encounter (Signed)
Advised patient.  She prefers not to increase dose of supplement, will work on potassium in diet.  Will start using salt substitute with potassium.

## 2013-04-07 ENCOUNTER — Other Ambulatory Visit: Payer: Self-pay

## 2013-07-08 ENCOUNTER — Other Ambulatory Visit: Payer: Self-pay

## 2013-08-14 ENCOUNTER — Other Ambulatory Visit: Payer: Self-pay | Admitting: Family Medicine

## 2013-08-15 ENCOUNTER — Other Ambulatory Visit: Payer: Self-pay | Admitting: Family Medicine

## 2013-09-09 ENCOUNTER — Other Ambulatory Visit: Payer: Self-pay | Admitting: Family Medicine

## 2013-09-10 ENCOUNTER — Other Ambulatory Visit: Payer: Self-pay | Admitting: Family Medicine

## 2013-09-14 ENCOUNTER — Other Ambulatory Visit: Payer: Self-pay | Admitting: Family Medicine

## 2013-11-01 ENCOUNTER — Other Ambulatory Visit: Payer: Self-pay | Admitting: *Deleted

## 2013-11-01 ENCOUNTER — Encounter: Payer: Self-pay | Admitting: Family Medicine

## 2013-11-01 ENCOUNTER — Ambulatory Visit (INDEPENDENT_AMBULATORY_CARE_PROVIDER_SITE_OTHER): Payer: 59 | Admitting: Family Medicine

## 2013-11-01 VITALS — BP 132/70 | HR 105 | Temp 98.0°F | Ht 60.25 in | Wt 127.8 lb

## 2013-11-01 DIAGNOSIS — I10 Essential (primary) hypertension: Secondary | ICD-10-CM

## 2013-11-01 DIAGNOSIS — J019 Acute sinusitis, unspecified: Secondary | ICD-10-CM

## 2013-11-01 DIAGNOSIS — E785 Hyperlipidemia, unspecified: Secondary | ICD-10-CM

## 2013-11-01 MED ORDER — NORVASC 5 MG PO TABS: 5.0000 mg | ORAL_TABLET | Freq: Every day | ORAL | Status: DC

## 2013-11-01 MED ORDER — ATORVASTATIN CALCIUM 10 MG PO TABS: 10.0000 mg | ORAL_TABLET | Freq: Every day | ORAL | Status: DC

## 2013-11-01 MED ORDER — HYDROCHLOROTHIAZIDE 25 MG PO TABS: 25.0000 mg | ORAL_TABLET | Freq: Every day | ORAL | Status: DC

## 2013-11-01 MED ORDER — AMOXICILLIN 875 MG PO TABS: 875.0000 mg | ORAL_TABLET | Freq: Two times a day (BID) | ORAL | Status: DC

## 2013-11-01 NOTE — Progress Notes (Signed)
68 yo pleasant female here for medication refill.  Overall has been doing very well but having a lot of sinus pressure and yellow mucous and cough for 2 weeks.  No fevers or chills.  No CP or SOB.  HTN- Has been on HCTZ 25 mg and Norvasc 5 mg daily for over a year. Also takes Klor Con 20 meq. Denies any CP, SOB, LE edema, blurred vision.  Lab Results  Component Value Date   CREATININE 0.8 02/17/2013   Lab Results  Component Value Date   NA 137 02/17/2013   K 3.1* 02/17/2013   CL 96 02/17/2013   CO2 31 02/17/2013    HLD- on lipitor 20 mg daily. No myalgias but she is concerned it is causing hair loss.  Lab Results  Component Value Date   CHOL 179 02/17/2013   HDL 47.90 02/17/2013   LDLCALC 98 02/17/2013   TRIG 168.0* 02/17/2013   CHOLHDL 4 02/17/2013     Patient Active Problem List   Diagnosis Date Noted  . DJD (degenerative joint disease) of cervical spine 02/17/2013  . HYPERLIPIDEMIA 11/08/2010  . HYPERTENSION 11/08/2010  . ALLERGIC RHINITIS 11/08/2010   Past Medical History  Diagnosis Date  . Allergic rhinitis   . Hyperlipidemia   . Hypertension    Past Surgical History  Procedure Laterality Date  . Gyn surgery  2002    hysterectomy   History  Substance Use Topics  . Smoking status: Never Smoker   . Smokeless tobacco: Not on file  . Alcohol Use: No   Family History  Problem Relation Age of Onset  . Heart attack Brother    Allergies  Allergen Reactions  . Lisinopril     REACTION: cough   Current Outpatient Prescriptions on File Prior to Visit  Medication Sig Dispense Refill  . cetirizine (ZYRTEC) 10 MG tablet Take 10 mg by mouth daily.      Marland Kitchen LIPITOR 20 MG tablet TAKE 1 TABLET EVERY DAY  90 tablet  1  . Multiple Vitamins-Minerals (CENTRUM SILVER PO) Take one by mouth daily      . potassium chloride SA (K-DUR,KLOR-CON) 20 MEQ tablet TAKE 1 TABLET EVERY DAY  90 tablet  1   No current facility-administered medications on file prior to visit.   The PMH, PSH,  Social History, Family History, Medications, and allergies have been reviewed in [facility], and have been updated if relevant.  ROS: See hpi Patient reports no  vision/ hearing changes,anorexia, weight change, fever ,adenopathy, persistant / recurrent hoarseness, swallowing issues, chest pain, edema,persistant / recurrent cough, hemoptysis, dyspnea(rest, exertional, paroxysmal nocturnal), gastrointestinal  bleeding (melena, rectal bleeding), abdominal pain, excessive heart burn, GU symptoms(dysuria, hematuria, pyuria, voiding/incontinence  Issues) syncope, focal weakness, severe memory loss, concerning skin lesions, depression, anxiety, abnormal bruising/bleeding, major joint swelling, breast masses or abnormal vaginal bleeding.    Physical exam: BP 132/70  Pulse 105  Temp(Src) 98 F (36.7 C) (Oral)  Ht 5' 0.25" (1.53 m)  Wt 127 lb 12 oz (57.947 kg)  BMI 24.75 kg/m2  SpO2 97%  General:  Well-developed,well-nourished,in no acute distress; alert,appropriate and cooperative throughout examination Head:  normocephalic and atraumatic.   Eyes:  vision grossly intact, pupils equal, pupils round, and pupils reactive to light.   Nose:  + mucosal erythema, no edema, frontal sinuses TTP bilaterally  Lungs:  Normal respiratory effort, chest expands symmetrically. Lungs are clear to auscultation, no crackles or wheezes. Heart:  Normal rate and regular rhythm. S1 and S2 normal without  gallop, murmur, click, rub or other extra sounds. Msk:  No deformity or scoliosis noted of thoracic or lumbar spine.   Extremities:  No clubbing, cyanosis, edema, or deformity noted with normal full range of motion of all joints.   Neurologic:  alert & oriented X3 and gait normal.   Skin:  Intact without suspicious lesions or rashes Psych:  Cognition and judgment appear intact. Alert and cooperative with normal attention span and concentration. No apparent delusions, illusions, hallucinations  Assessment and Plan:

## 2013-11-01 NOTE — Assessment & Plan Note (Signed)
Well controlled on current rx. No changes. 

## 2013-11-01 NOTE — Patient Instructions (Signed)
Good to see you. Continue zyrtec and nasal spray. Also Delsym for cough.  Take amoxicillin as directed- 1 tablet twice daily x 10 days.  Let's decrease your lipitor to 10 mg daily.

## 2013-11-01 NOTE — Assessment & Plan Note (Signed)
Given duration and progression of symptoms, will treat for bacterial sinusitis with amoxicillin.

## 2013-11-01 NOTE — Progress Notes (Signed)
Pre visit review using our clinic review tool, if applicable. No additional management support is needed unless otherwise documented below in the visit note. 

## 2013-11-01 NOTE — Assessment & Plan Note (Signed)
Decrease lipitor to 10 mg daily. Follow up in 3 months.

## 2013-11-01 NOTE — Telephone Encounter (Signed)
Received faxed note from pharmacy showing that these were refilled today for #30 now patient is requesting a 90 day supply. Is this okay?

## 2013-11-02 ENCOUNTER — Telehealth: Payer: Self-pay | Admitting: Family Medicine

## 2013-11-02 NOTE — Telephone Encounter (Signed)
Relevant patient education assigned to patient using Emmi. ° °

## 2014-01-31 ENCOUNTER — Other Ambulatory Visit: Payer: 59

## 2014-01-31 ENCOUNTER — Ambulatory Visit (INDEPENDENT_AMBULATORY_CARE_PROVIDER_SITE_OTHER): Payer: 59 | Admitting: Family Medicine

## 2014-01-31 ENCOUNTER — Encounter: Payer: Self-pay | Admitting: Family Medicine

## 2014-01-31 VITALS — BP 128/72 | HR 77 | Temp 98.0°F | Wt 126.8 lb

## 2014-01-31 DIAGNOSIS — E785 Hyperlipidemia, unspecified: Secondary | ICD-10-CM

## 2014-01-31 DIAGNOSIS — M62838 Other muscle spasm: Secondary | ICD-10-CM | POA: Insufficient documentation

## 2014-01-31 DIAGNOSIS — I10 Essential (primary) hypertension: Secondary | ICD-10-CM

## 2014-01-31 LAB — COMPREHENSIVE METABOLIC PANEL
ALT: 17 U/L (ref 0–35)
AST: 22 U/L (ref 0–37)
Albumin: 4.3 g/dL (ref 3.5–5.2)
Alkaline Phosphatase: 71 U/L (ref 39–117)
BUN: 19 mg/dL (ref 6–23)
CO2: 29 mEq/L (ref 19–32)
Calcium: 9.6 mg/dL (ref 8.4–10.5)
Chloride: 99 mEq/L (ref 96–112)
Creatinine, Ser: 0.9 mg/dL (ref 0.4–1.2)
GFR: 67.92 mL/min (ref >60.00)
Glucose, Bld: 98 mg/dL (ref 70–99)
Potassium: 3.1 mEq/L — ABNORMAL LOW (ref 3.5–5.1)
Sodium: 137 mEq/L (ref 135–145)
Total Bilirubin: 0.5 mg/dL (ref 0.2–1.2)
Total Protein: 7.8 g/dL (ref 6.0–8.3)

## 2014-01-31 LAB — LIPID PANEL
Cholesterol: 203 mg/dL — ABNORMAL HIGH (ref 0–200)
HDL: 44.4 mg/dL (ref >39.00)
LDL Cholesterol: 123 mg/dL — ABNORMAL HIGH (ref 0–99)
Total CHOL/HDL Ratio: 5
Triglycerides: 178 mg/dL — ABNORMAL HIGH (ref 0.0–149.0)
VLDL: 35.6 mg/dL (ref 0.0–40.0)

## 2014-01-31 MED ORDER — NORVASC 5 MG PO TABS: 5.0000 mg | ORAL_TABLET | Freq: Every day | ORAL | Status: DC

## 2014-01-31 MED ORDER — HYDROCHLOROTHIAZIDE 25 MG PO TABS: 25.0000 mg | ORAL_TABLET | Freq: Every day | ORAL | Status: DC

## 2014-01-31 MED ORDER — ATORVASTATIN CALCIUM 10 MG PO TABS
10.0000 mg | ORAL_TABLET | Freq: Every day | ORAL | Status: DC
Start: 2014-01-31 — End: 2014-08-01

## 2014-01-31 MED ORDER — CYCLOBENZAPRINE HCL 5 MG PO TABS: 5.0000 mg | ORAL_TABLET | Freq: Every evening | ORAL | Status: DC | PRN

## 2014-01-31 MED ORDER — POTASSIUM CHLORIDE CRYS ER 20 MEQ PO TBCR: EXTENDED_RELEASE_TABLET | ORAL | Status: DC

## 2014-01-31 MED ORDER — CETIRIZINE HCL 10 MG PO TABS: 10.0000 mg | ORAL_TABLET | Freq: Every day | ORAL | Status: DC

## 2014-01-31 NOTE — Assessment & Plan Note (Signed)
Well controlled on current rx. Will check lytes today since she is on potassium supplementation.

## 2014-01-31 NOTE — Assessment & Plan Note (Signed)
With h/o DDD of cervical spine. eRx sent for low dose flexeril- discussed sedation potential. Continue low dose prn Ibuprofen (with food).

## 2014-01-31 NOTE — Assessment & Plan Note (Signed)
Continue current dose of lipitor. Recheck lipid panel and CMET today.

## 2014-01-31 NOTE — Progress Notes (Signed)
Pre visit review using our clinic review tool, if applicable. No additional management support is needed unless otherwise documented below in the visit note. 

## 2014-01-31 NOTE — Patient Instructions (Signed)
Use flexeril as needed at bedtime- this can make sleepy. Continue Ibuprofen with food as needed. Continue massage.  We will call you with your lab results.

## 2014-01-31 NOTE — Progress Notes (Signed)
68 yo pleasant female here for medication refill.  Overall has been doing very well but having some shoulder pain.  Started two weeks ago- bilaterally, "tightness that starts in neck and goes to shoulders."  Did some yard work.  No known injury. No tingling in hands or UE weakness. Does get massages regularly.  HTN- Has been on HCTZ 25 mg and Norvasc 5 mg daily for over a year. Also takes Klor Con 20 meq. Denies any CP, SOB, LE edema, blurred vision.  Lab Results  Component Value Date   CREATININE 0.8 02/17/2013   Lab Results  Component Value Date   NA 137 02/17/2013   K 3.1* 02/17/2013   CL 96 02/17/2013   CO2 31 02/17/2013    HLD- on lipitor 10 mg daily., was previously taking 20 mg daily.  No muscle aches on this dose. Lab Results  Component Value Date   CHOL 179 02/17/2013   HDL 47.90 02/17/2013   LDLCALC 98 02/17/2013   TRIG 168.0* 02/17/2013   CHOLHDL 4 02/17/2013     Patient Active Problem List   Diagnosis Date Noted  . DJD (degenerative joint disease) of cervical spine 02/17/2013  . HYPERLIPIDEMIA 11/08/2010  . HYPERTENSION 11/08/2010  . ALLERGIC RHINITIS 11/08/2010   Past Medical History  Diagnosis Date  . Allergic rhinitis   . Hyperlipidemia   . Hypertension    Past Surgical History  Procedure Laterality Date  . Gyn surgery  2002    hysterectomy   History  Substance Use Topics  . Smoking status: Never Smoker   . Smokeless tobacco: Not on file  . Alcohol Use: No   Family History  Problem Relation Age of Onset  . Heart attack Brother    Allergies  Allergen Reactions  . Lisinopril     REACTION: cough   Current Outpatient Prescriptions on File Prior to Visit  Medication Sig Dispense Refill  . Multiple Vitamins-Minerals (CENTRUM SILVER PO) Take one by mouth daily       No current facility-administered medications on file prior to visit.   The PMH, PSH, Social History, Family History, Medications, and allergies have been reviewed in [facility], and have been  updated if relevant.  ROS: See hpi  Physical exam: BP 128/72  Pulse 77  Temp(Src) 98 F (36.7 C) (Oral)  Wt 126 lb 12 oz (57.493 kg)  SpO2 99%  General:  Well-developed,well-nourished,in no acute distress; alert,appropriate and cooperative throughout examination Head:  normocephalic and atraumatic.   Lungs:  Normal respiratory effort, chest expands symmetrically. Lungs are clear to auscultation, no crackles or wheezes. Heart:  Normal rate and regular rhythm. S1 and S2 normal without gallop, murmur, click, rub or other extra sounds. Msk:  No deformity or scoliosis noted of thoracic or lumbar spine.   Trapezius spasm bilaterally, normal grip strength bilaterally Shoulders normal to inspection and palpation Neg empty can Neg arch sign bilaterally Neg spurling

## 2014-02-01 ENCOUNTER — Encounter: Payer: Self-pay | Admitting: Family Medicine

## 2014-04-29 ENCOUNTER — Other Ambulatory Visit: Payer: Self-pay | Admitting: Family Medicine

## 2014-07-27 ENCOUNTER — Ambulatory Visit (INDEPENDENT_AMBULATORY_CARE_PROVIDER_SITE_OTHER): Payer: 59 | Admitting: Family Medicine

## 2014-07-27 ENCOUNTER — Encounter: Payer: Self-pay | Admitting: Family Medicine

## 2014-07-27 ENCOUNTER — Ambulatory Visit: Payer: 59 | Admitting: Family Medicine

## 2014-07-27 ENCOUNTER — Other Ambulatory Visit: Payer: Self-pay | Admitting: *Deleted

## 2014-07-27 VITALS — BP 144/78 | HR 72 | Temp 98.1°F | Wt 125.0 lb

## 2014-07-27 DIAGNOSIS — I10 Essential (primary) hypertension: Secondary | ICD-10-CM

## 2014-07-27 MED ORDER — NORVASC 5 MG PO TABS: ORAL_TABLET | ORAL | Status: DC

## 2014-07-27 MED ORDER — POTASSIUM CHLORIDE CRYS ER 20 MEQ PO TBCR: EXTENDED_RELEASE_TABLET | ORAL | Status: DC

## 2014-07-27 NOTE — Assessment & Plan Note (Signed)
Fluctuating.  She is checking her BP regularly and very reliable with medical background. Will advise to take second dose of norvasc 5 mg if BP remains elevated an hour after 1st dose. Will check electrolytes.

## 2014-07-27 NOTE — Progress Notes (Signed)
68 yo pleasant female here for BP concerns.   HTN-  Had jury duty on 11/16- felt "funny" when she got home- ears warm, swimmy headed.  BP that day was 150/80, then it she quickly became normotensive. Since then, has been fluctuating.  This am, BP was 160/90. Felt swimmy headed again this am.  Has been on HCTZ 25 mg and Norvasc 5 mg daily for over a year. Also takes Klor Con 20 meq. Denies any CP, SOB, LE edema, blurred vision.  Lab Results  Component Value Date   CREATININE 0.9 01/31/2014   Lab Results  Component Value Date   NA 137 01/31/2014   K 3.1* 01/31/2014   CL 99 01/31/2014   CO2 29 01/31/2014      Patient Active Problem List   Diagnosis Date Noted  . Muscle spasms of neck 01/31/2014  . DJD (degenerative joint disease) of cervical spine 02/17/2013  . HYPERLIPIDEMIA 11/08/2010  . Essential hypertension 11/08/2010  . ALLERGIC RHINITIS 11/08/2010   Past Medical History  Diagnosis Date  . Allergic rhinitis   . Hyperlipidemia   . Hypertension    Past Surgical History  Procedure Laterality Date  . Gyn surgery  2002    hysterectomy   History  Substance Use Topics  . Smoking status: Never Smoker   . Smokeless tobacco: Not on file  . Alcohol Use: No   Family History  Problem Relation Age of Onset  . Heart attack Brother    Allergies  Allergen Reactions  . Lisinopril     REACTION: cough   Current Outpatient Prescriptions on File Prior to Visit  Medication Sig Dispense Refill  . atorvastatin (LIPITOR) 10 MG tablet Take 1 tablet (10 mg total) by mouth daily. 90 tablet 1  . cetirizine (ZYRTEC) 10 MG tablet TAKE 1 TABLET (10 MG TOTAL) BY MOUTH DAILY. 90 tablet 0  . cyclobenzaprine (FLEXERIL) 5 MG tablet Take 1 tablet (5 mg total) by mouth at bedtime as needed for muscle spasms. 20 tablet 0  . hydrochlorothiazide (HYDRODIURIL) 25 MG tablet TAKE 1 TABLET (25 MG TOTAL) BY MOUTH DAILY. 90 tablet 0  . Multiple Vitamins-Minerals (CENTRUM SILVER PO) Take one by mouth  daily    . NORVASC 5 MG tablet TAKE 1 TABLET (5 MG TOTAL) BY MOUTH DAILY. 90 tablet 0   No current facility-administered medications on file prior to visit.   The PMH, PSH, Social History, Family History, Medications, and allergies have been reviewed in [facility], and have been updated if relevant.  ROS: See hpi  Physical exam: BP 144/78 mmHg  Pulse 72  Temp(Src) 98.1 F (36.7 C) (Oral)  Wt 125 lb (56.7 kg)  SpO2 97%  General:  Well-developed,well-nourished,in no acute distress; alert,appropriate and cooperative throughout examination Head:  normocephalic and atraumatic.   Lungs:  Normal respiratory effort, chest expands symmetrically. Lungs are clear to auscultation, no crackles or wheezes. Heart:  Normal rate and regular rhythm. S1 and S2 normal without gallop, murmur, click, rub or other extra sounds. Msk:  No deformity or scoliosis noted of thoracic or lumbar spine.   Ext:  No edema

## 2014-07-27 NOTE — Patient Instructions (Signed)
It was great to see you. Try taking a second dose of amlodipine 5 mg if your blood pressure remains elevated an hour after taking it.  We will call you with your lab results.

## 2014-07-27 NOTE — Addendum Note (Signed)
Addended by: Marchia Bond on: 07/27/2014 01:23 PM   Modules accepted: Orders, SmartSet

## 2014-07-27 NOTE — Progress Notes (Signed)
Pre visit review using our clinic review tool, if applicable. No additional management support is needed unless otherwise documented below in the visit note. 

## 2014-07-28 LAB — BASIC METABOLIC PANEL
BUN: 12 mg/dL (ref 6–23)
CO2: 29 mEq/L (ref 19–32)
Calcium: 9.9 mg/dL (ref 8.4–10.5)
Chloride: 97 mEq/L (ref 96–112)
Creat: 0.8 mg/dL (ref 0.50–1.10)
Glucose, Bld: 95 mg/dL (ref 70–99)
Potassium: 3.3 mEq/L — ABNORMAL LOW (ref 3.5–5.3)
Sodium: 138 mEq/L (ref 135–145)

## 2014-07-31 ENCOUNTER — Emergency Department (HOSPITAL_COMMUNITY)
Admission: EM | Admit: 2014-07-31 | Discharge: 2014-07-31 | Disposition: A | Discharge status: Home / Self Care | Payer: 59 | Attending: Emergency Medicine | Admitting: Emergency Medicine

## 2014-07-31 ENCOUNTER — Emergency Department (HOSPITAL_COMMUNITY): Payer: 59

## 2014-07-31 ENCOUNTER — Encounter (HOSPITAL_COMMUNITY): Payer: Self-pay | Admitting: *Deleted

## 2014-07-31 DIAGNOSIS — E876 Hypokalemia: Secondary | ICD-10-CM | POA: Insufficient documentation

## 2014-07-31 DIAGNOSIS — Z79899 Other long term (current) drug therapy: Secondary | ICD-10-CM | POA: Insufficient documentation

## 2014-07-31 DIAGNOSIS — I10 Essential (primary) hypertension: Secondary | ICD-10-CM | POA: Diagnosis not present

## 2014-07-31 DIAGNOSIS — R51 Headache: Secondary | ICD-10-CM | POA: Diagnosis not present

## 2014-07-31 DIAGNOSIS — R42 Dizziness and giddiness: Secondary | ICD-10-CM

## 2014-07-31 DIAGNOSIS — R519 Headache, unspecified: Secondary | ICD-10-CM

## 2014-07-31 DIAGNOSIS — E785 Hyperlipidemia, unspecified: Secondary | ICD-10-CM | POA: Insufficient documentation

## 2014-07-31 LAB — CBC WITH DIFFERENTIAL/PLATELET
Basophils Absolute: 0 10*3/uL (ref 0.0–0.1)
Basophils Relative: 0 % (ref 0–1)
Eosinophils Absolute: 0.3 10*3/uL (ref 0.0–0.7)
Eosinophils Relative: 5 % (ref 0–5)
HCT: 37 % (ref 36.0–46.0)
Hemoglobin: 13 g/dL (ref 12.0–15.0)
Lymphocytes Relative: 32 % (ref 12–46)
Lymphs Abs: 1.5 10*3/uL (ref 0.7–4.0)
MCH: 28.3 pg (ref 26.0–34.0)
MCHC: 35.1 g/dL (ref 30.0–36.0)
MCV: 80.4 fL (ref 78.0–100.0)
Monocytes Absolute: 0.3 10*3/uL (ref 0.1–1.0)
Monocytes Relative: 6 % (ref 3–12)
Neutro Abs: 2.6 10*3/uL (ref 1.7–7.7)
Neutrophils Relative %: 57 % (ref 43–77)
Platelets: 275 10*3/uL (ref 150–400)
RBC: 4.6 MIL/uL (ref 3.87–5.11)
RDW: 12.5 % (ref 11.5–15.5)
WBC: 4.7 10*3/uL (ref 4.0–10.5)

## 2014-07-31 LAB — BASIC METABOLIC PANEL
Anion gap: 13 (ref 5–15)
BUN: 10 mg/dL (ref 6–23)
CO2: 29 mEq/L (ref 19–32)
Calcium: 9.5 mg/dL (ref 8.4–10.5)
Chloride: 95 mEq/L — ABNORMAL LOW (ref 96–112)
Creatinine, Ser: 0.65 mg/dL (ref 0.50–1.10)
GFR calc Af Amer: >90 mL/min (ref >90)
GFR calc non Af Amer: 89 mL/min — ABNORMAL LOW (ref >90)
Glucose, Bld: 106 mg/dL — ABNORMAL HIGH (ref 70–99)
Potassium: 2.8 mEq/L — CL (ref 3.7–5.3)
Sodium: 137 mEq/L (ref 137–147)

## 2014-07-31 LAB — HEPATIC FUNCTION PANEL
ALT: 11 U/L (ref 0–35)
AST: 17 U/L (ref 0–37)
Albumin: 3.8 g/dL (ref 3.5–5.2)
Alkaline Phosphatase: 83 U/L (ref 39–117)
Bilirubin, Direct: <0.2 mg/dL (ref 0.0–0.3)
Total Bilirubin: 0.5 mg/dL (ref 0.3–1.2)
Total Protein: 7.6 g/dL (ref 6.0–8.3)

## 2014-07-31 LAB — TROPONIN I: Troponin I: <0.3 ng/mL (ref <0.30)

## 2014-07-31 LAB — URINALYSIS, ROUTINE W REFLEX MICROSCOPIC
Bilirubin Urine: NEGATIVE
Glucose, UA: NEGATIVE mg/dL
Ketones, ur: NEGATIVE mg/dL
Leukocytes, UA: NEGATIVE
Nitrite: NEGATIVE
Protein, ur: NEGATIVE mg/dL
Specific Gravity, Urine: 1.005 (ref 1.005–1.030)
Urobilinogen, UA: 0.2 mg/dL (ref 0.0–1.0)
pH: 7 (ref 5.0–8.0)

## 2014-07-31 LAB — URINE MICROSCOPIC-ADD ON

## 2014-07-31 LAB — LIPASE, BLOOD: Lipase: 44 U/L (ref 11–59)

## 2014-07-31 MED ORDER — POTASSIUM CHLORIDE 20 MEQ PO PACK
40.0000 meq | PACK | Freq: Two times a day (BID) | ORAL | Status: DC
  Filled 2014-07-31 (×2): qty 2

## 2014-07-31 MED ORDER — SODIUM CHLORIDE 0.9 % IV SOLN
INTRAVENOUS | Status: DC
  Administered 2014-07-31: 08:00:00 via INTRAVENOUS

## 2014-07-31 MED ORDER — POTASSIUM CHLORIDE CRYS ER 20 MEQ PO TBCR
40.0000 meq | EXTENDED_RELEASE_TABLET | Freq: Two times a day (BID) | ORAL | Status: DC
  Administered 2014-07-31: 40 meq via ORAL
  Filled 2014-07-31: qty 2

## 2014-07-31 MED ORDER — LORAZEPAM 2 MG/ML IJ SOLN
1.0000 mg | Freq: Once | INTRAMUSCULAR | Status: AC
  Administered 2014-07-31: 1 mg via INTRAVENOUS
  Filled 2014-07-31: qty 1

## 2014-07-31 MED ORDER — MECLIZINE HCL 25 MG PO TABS: 25.0000 mg | ORAL_TABLET | Freq: Four times a day (QID) | ORAL | Status: DC | PRN

## 2014-07-31 MED ORDER — MECLIZINE HCL 25 MG PO TABS
25.0000 mg | ORAL_TABLET | Freq: Once | ORAL | Status: AC
  Administered 2014-07-31: 25 mg via ORAL
  Filled 2014-07-31: qty 1

## 2014-07-31 MED ORDER — ONDANSETRON 4 MG PO TBDP: 4.0000 mg | ORAL_TABLET | ORAL | Status: DC | PRN

## 2014-07-31 NOTE — ED Provider Notes (Signed)
CSN: [id number]     Arrival date & time 07/31/14  0447 History   First MD Initiated Contact with Patient 07/31/14 [phone]     Chief Complaint  Patient presents with  . Dizziness     (Consider location/radiation/quality/duration/timing/severity/associated sxs/prior Treatment) HPI The patient has had several weeks worth of headaches. She has been seen by her doctor for this without a specific treatment or diagnostic testing. The patient had attributed her symptoms to her blood pressure. Triage notes lightheadedness as the quality. As I review is that the patient she describes the whole room spinning around her tonight. By history it appears that the dizziness which today has a vertiginous quality was not a pronounced symptom until more recently. She reports she became diaphoretic. And also felt a burning quality in her chest. The headache is posterior and diffuse. The patient has been nauseated but has not vomited. She has not had fever chills or neck stiffness. There've been no visual changes and no gait and coordination. Past Medical History  Diagnosis Date  . Allergic rhinitis   . Hyperlipidemia   . Hypertension    Past Surgical History  Procedure Laterality Date  . Gyn surgery  2002    hysterectomy   Family History  Problem Relation Age of Onset  . Heart attack Brother    History  Substance Use Topics  . Smoking status: Never Smoker   . Smokeless tobacco: Not on file  . Alcohol Use: No   OB History    No data available     Review of Systems 10 Systems reviewed and are negative for acute change except as noted in the HPI.    Allergies  Lisinopril  Home Medications   Prior to Admission medications   Medication Sig Start Date End Date Taking? Authorizing Provider  cetirizine (ZYRTEC) 10 MG tablet TAKE 1 TABLET (10 MG TOTAL) BY MOUTH DAILY. 04/29/14  Yes Lucille Passy, MD  cyclobenzaprine (FLEXERIL) 5 MG tablet Take 1 tablet (5 mg total) by mouth at bedtime as needed for  muscle spasms. 01/31/14  Yes Lucille Passy, MD  ibuprofen (ADVIL,MOTRIN) 200 MG tablet Take 400 mg by mouth every 6 (six) hours as needed for moderate pain.   Yes Historical Provider, MD  NORVASC 5 MG tablet 1-2 tabs by mouth daily. Patient taking differently: Take 5 mg by mouth daily.  07/27/14  Yes Lucille Passy, MD  potassium chloride SA (K-DUR,KLOR-CON) 20 MEQ tablet TAKE 1 TABLET EVERY DAY 07/27/14  Yes Lucille Passy, MD  atorvastatin (LIPITOR) 10 MG tablet Take 1 tablet (10 mg total) by mouth daily. 08/01/14   Lucille Passy, MD  hydrochlorothiazide (HYDRODIURIL) 25 MG tablet TAKE 1 TABLET (25 MG TOTAL) BY MOUTH DAILY. 08/01/14   Lucille Passy, MD  meclizine (ANTIVERT) 25 MG tablet Take 1 tablet (25 mg total) by mouth every 6 (six) hours as needed for dizziness. 07/31/14   Charlesetta Shanks, MD  Multiple Vitamins-Minerals (CENTRUM SILVER PO) Take 1 tablet by mouth daily. Take one by mouth daily    Historical Provider, MD  ondansetron (ZOFRAN ODT) 4 MG disintegrating tablet Take 1 tablet (4 mg total) by mouth every 4 (four) hours as needed for nausea or vomiting. 07/31/14   Charlesetta Shanks, MD   BP 131/62 mmHg  Pulse 73  Temp(Src) 98.3 F (36.8 C) (Oral)  Resp 21  SpO2 97% Physical Exam  Constitutional: She is oriented to person, place, and time. She appears well-developed and well-nourished.  The patient appears moderately uncomfortable she is alert and nontoxic. She has no respiratory distress.  HENT:  Head: Normocephalic and atraumatic.  Right Ear: External ear normal.  Left Ear: External ear normal.  Nose: Nose normal.  Mouth/Throat: Oropharynx is clear and moist. No oropharyngeal exudate.  Bilateral TMs normal  Eyes: Conjunctivae and EOM are normal. Pupils are equal, round, and reactive to light. Right eye exhibits no discharge. Left eye exhibits no discharge.  Neck: Neck supple.  Cardiovascular: Normal rate, regular rhythm, normal heart sounds and intact distal pulses.  Exam reveals no  gallop and no friction rub.   No murmur heard. Pulmonary/Chest: Effort normal and breath sounds normal. No respiratory distress. She has no wheezes. She has no rales. She exhibits no tenderness.  Abdominal: Soft. Bowel sounds are normal. She exhibits no distension. There is no tenderness. There is no rebound and no guarding.  Musculoskeletal: Normal range of motion. She exhibits no edema or tenderness.  Neurological: She is alert and oriented to person, place, and time. No cranial nerve deficit. Coordination normal.  Skin: Skin is warm and dry. No rash noted. No erythema. No pallor.  Psychiatric: She has a normal mood and affect.    ED Course  Procedures (including critical care time) Labs Review Labs Reviewed  BASIC METABOLIC PANEL - Abnormal; Notable for the following:    Potassium 2.8 (*)    Chloride 95 (*)    Glucose, Bld 106 (*)    GFR calc non Af Amer 89 (*)    All other components within normal limits  URINALYSIS, ROUTINE W REFLEX MICROSCOPIC - Abnormal; Notable for the following:    Hgb urine dipstick SMALL (*)    All other components within normal limits  CBC WITH DIFFERENTIAL  LIPASE, BLOOD  TROPONIN I  HEPATIC FUNCTION PANEL  URINE MICROSCOPIC-ADD ON    Imaging Review No results found.   EKG Interpretation   Date/Time:  Sunday July 31 2014 04:59:47 EST Ventricular Rate:  69 PR Interval:  168 QRS Duration: 112 QT Interval:  432 QTC Calculation: 463 R Axis:   77 Text Interpretation:  Sinus rhythm Borderline intraventricular conduction  delay agree. no change Confirmed by Johnney Killian, MD, Jeannie Done [phone]) on  07/31/2014 7:12:08 AM      CRITICAL CARE Performed by: Charlesetta Shanks   Total critical care time: 40  Critical care time was exclusive of separately billable procedures and treating other patients.  Critical care was necessary to treat or prevent imminent or life-threatening deterioration.  Critical care was time spent personally by me on the  following activities: development of treatment plan with patient and/or surrogate as well as nursing, discussions with consultants, evaluation of patient's response to treatment, examination of patient, obtaining history from patient or surrogate, ordering and performing treatments and interventions, ordering and review of laboratory studies, ordering and review of radiographic studies, pulse oximetry and re-evaluation of patient's condition.  MDM   Final diagnoses:  Headache  Vertigo  Hypokalemia    With history of new posterior headache this week and onset of significantly symptomatic vertigo, had concern for vertebrobasilar dissection or cerebellar CVA. These were ruled out by MRI/MRA. At this time symptoms c/w positional vertigo and headache. Patient has hypokalemia for which she already takes potassium supplementation. Dose will be increased. Not likely associated with patients acute symptom presentation.    Charlesetta Shanks, MD 08/02/14 1106

## 2014-07-31 NOTE — ED Notes (Signed)
PT request medication for nerves before MRI.

## 2014-07-31 NOTE — ED Notes (Signed)
Pt in MRI at this time 

## 2014-07-31 NOTE — ED Notes (Signed)
MD at bedside. 

## 2014-07-31 NOTE — ED Notes (Signed)
Patient transported to MRI 

## 2014-07-31 NOTE — ED Notes (Signed)
Here by [facility] from home for dizziness. Describes as light headedness. Ongoing for 3-4 hrs. Intermittent for 3-4 weeks. Relates to recent BP med changes. Takes norvasc and HCTZ. Admits to upper neck pain and lower occiput HA. No obvious orthostatic changes per EMS. H/o low potassium. States, "worse with standing", rates HA 7/10, mild nausea. BP was 170/90, then 150/70 per EMS. Alert, NAD, calm, interactive, NSR on monitor, HR 76.

## 2014-07-31 NOTE — ED Notes (Signed)
CRITICAL VALUE ALERT  Critical value received:  Potassium 2.8  Date of notification:  07/31/14  Time of notification: 0828  Critical value read back: yes  Nurse who received alert:  Rex Kras  MD notified:0829  No new orders at this time.

## 2014-07-31 NOTE — Discharge Instructions (Signed)
Hypokalemia Hypokalemia means that the amount of potassium in the blood is lower than normal.Potassium is a chemical, called an electrolyte, that helps regulate the amount of fluid in the body. It also stimulates muscle contraction and helps nerves function properly.Most of the body's potassium is inside of cells, and only a very small amount is in the blood. Because the amount in the blood is so small, minor changes can be life-threatening.  TAKE 2 OF YOUR PRESCRIBED POTASSIUM PILLS DAILY  CAUSES  Antibiotics.  Diarrhea or vomiting.  Using laxatives too much, which can cause diarrhea.  Chronic kidney disease.  Water pills (diuretics).  Eating disorders (bulimia).  Low magnesium level.  Sweating a lot. SIGNS AND SYMPTOMS  Weakness.  Constipation.  Fatigue.  Muscle cramps.  Mental confusion.  Skipped heartbeats or irregular heartbeat (palpitations).  Tingling or numbness. DIAGNOSIS  Your health care provider can diagnose hypokalemia with blood tests. In addition to checking your potassium level, your health care provider may also check other lab tests. TREATMENT Hypokalemia can be treated with potassium supplements taken by mouth or adjustments in your current medicines. If your potassium level is very low, you may need to get potassium through a vein (IV) and be monitored in the hospital. A diet high in potassium is also helpful. Foods high in potassium are:  Nuts, such as peanuts and pistachios.  Seeds, such as sunflower seeds and pumpkin seeds.  Peas, lentils, and lima beans.  Whole grain and bran cereals and breads.  Fresh fruit and vegetables, such as apricots, avocado, bananas, cantaloupe, kiwi, oranges, tomatoes, asparagus, and potatoes.  Orange and tomato juices.  Red meats.  Fruit yogurt. HOME CARE INSTRUCTIONS  Take all medicines as prescribed by your health care provider.  Maintain a healthy diet by including nutritious food, such as fruits,  vegetables, nuts, whole grains, and lean meats.  If you are taking a laxative, be sure to follow the directions on the label. SEEK MEDICAL CARE IF:  Your weakness gets worse.  You feel your heart pounding or racing.  You are vomiting or having diarrhea.  You are diabetic and having trouble keeping your blood glucose in the normal range. SEEK IMMEDIATE MEDICAL CARE IF:  You have chest pain, shortness of breath, or dizziness.  You are vomiting or having diarrhea for more than 2 days.  You faint. MAKE SURE YOU:   Understand these instructions.  Will watch your condition.  Will get help right away if you are not doing well or get worse. Document Released: 08/19/2005 Document Revised: 06/09/2013 Document Reviewed: 02/19/2013 [facility] Patient Information 2015 [place], [place]. This information is not intended to replace advice given to you by your health care provider. Make sure you discuss any questions you have with your health care provider. Vertigo Vertigo means you feel like you or your surroundings are moving when they are not. Vertigo can be dangerous if it occurs when you are at work, driving, or performing difficult activities.  CAUSES  Vertigo occurs when there is a conflict of signals sent to your brain from the visual and sensory systems in your body. There are many different causes of vertigo, including:  Infections, especially in the inner ear.  A bad reaction to a drug or misuse of alcohol and medicines.  Withdrawal from drugs or alcohol.  Rapidly changing positions, such as lying down or rolling over in bed.  A migraine headache.  Decreased blood flow to the brain.  Increased pressure in the brain from a  head injury, infection, tumor, or bleeding. SYMPTOMS  You may feel as though the world is spinning around or you are falling to the ground. Because your balance is upset, vertigo can cause nausea and vomiting. You may have involuntary eye movements  (nystagmus). DIAGNOSIS  Vertigo is usually diagnosed by physical exam. If the cause of your vertigo is unknown, your caregiver may perform imaging tests, such as an MRI scan (magnetic resonance imaging). TREATMENT  Most cases of vertigo resolve on their own, without treatment. Depending on the cause, your caregiver may prescribe certain medicines. If your vertigo is related to body position issues, your caregiver may recommend movements or procedures to correct the problem. In rare cases, if your vertigo is caused by certain inner ear problems, you may need surgery. HOME CARE INSTRUCTIONS   Follow your caregiver's instructions.  Avoid driving.  Avoid operating heavy machinery.  Avoid performing any tasks that would be dangerous to you or others during a vertigo episode.  Tell your caregiver if you notice that certain medicines seem to be causing your vertigo. Some of the medicines used to treat vertigo episodes can actually make them worse in some people. SEEK IMMEDIATE MEDICAL CARE IF:   Your medicines do not relieve your vertigo or are making it worse.  You develop problems with talking, walking, weakness, or using your arms, hands, or legs.  You develop severe headaches.  Your nausea or vomiting continues or gets worse.  You develop visual changes.  A family member notices behavioral changes.  Your condition gets worse. MAKE SURE YOU:  Understand these instructions.  Will watch your condition.  Will get help right away if you are not doing well or get worse. Document Released: 05/29/2005 Document Revised: 11/11/2011 Document Reviewed: 03/07/2011 [facility] Patient Information 2015 [place], [place]. This information is not intended to replace advice given to you by your health care provider. Make sure you discuss any questions you have with your health care provider.

## 2014-07-31 NOTE — ED Notes (Signed)
Returned from MRI 

## 2014-08-01 ENCOUNTER — Other Ambulatory Visit: Payer: Self-pay | Admitting: *Deleted

## 2014-08-01 MED ORDER — HYDROCHLOROTHIAZIDE 25 MG PO TABS: ORAL_TABLET | ORAL | Status: DC

## 2014-08-01 MED ORDER — ATORVASTATIN CALCIUM 10 MG PO TABS: 10.0000 mg | ORAL_TABLET | Freq: Every day | ORAL | Status: DC

## 2014-08-02 ENCOUNTER — Ambulatory Visit (INDEPENDENT_AMBULATORY_CARE_PROVIDER_SITE_OTHER): Payer: 59 | Admitting: Family Medicine

## 2014-08-02 ENCOUNTER — Encounter: Payer: Self-pay | Admitting: Family Medicine

## 2014-08-02 VITALS — BP 134/72 | HR 105 | Temp 98.0°F | Wt 129.5 lb

## 2014-08-02 DIAGNOSIS — E876 Hypokalemia: Secondary | ICD-10-CM

## 2014-08-02 DIAGNOSIS — H8110 Benign paroxysmal vertigo, unspecified ear: Secondary | ICD-10-CM | POA: Insufficient documentation

## 2014-08-02 MED ORDER — MECLIZINE HCL 25 MG PO TABS: 25.0000 mg | ORAL_TABLET | Freq: Four times a day (QID) | ORAL | Status: DC | PRN

## 2014-08-02 NOTE — Patient Instructions (Addendum)
Great to see you. Please come back on Friday for lab recheck- schedule this on your way out.  We will call you with your lab results.

## 2014-08-02 NOTE — Assessment & Plan Note (Signed)
Deteriorated. Continue Kdur 40 meQ daily for now- recheck BMET today and again on Friday. The patient indicates understanding of these issues and agrees with the plan.

## 2014-08-02 NOTE — Progress Notes (Signed)
Subjective:   Patient ID: Robin Peck, female    DOB: August 03, 1946, 68 y.o.   MRN: [id number]  Robin Peck is a pleasant 68 y.o. year old female who presents to clinic today with her husband for Hospitalization Follow-up  on 08/02/2014  HPI: Was seen at [facility] on 11/29 for acute onset of dizziness/room spinning. Notes reviewed. Husband called EMS because she had never had symptoms like this in past and was having cold sweats as well.  Symptoms worse with changes in head position.  Potassium was low at 2.8- advised to increase her potassium to 40 meQ daily rather than 20. She did stop eating bananas recently.    Given meclizine- symptoms improve but not resolved.  MRI/MRA neg.  Denies any nausea or vomiting. No dysarthria or other focal neurological issues.  Current Outpatient Prescriptions on File Prior to Visit  Medication Sig Dispense Refill  . atorvastatin (LIPITOR) 10 MG tablet Take 1 tablet (10 mg total) by mouth daily. 90 tablet 0  . cetirizine (ZYRTEC) 10 MG tablet TAKE 1 TABLET (10 MG TOTAL) BY MOUTH DAILY. 90 tablet 0  . cyclobenzaprine (FLEXERIL) 5 MG tablet Take 1 tablet (5 mg total) by mouth at bedtime as needed for muscle spasms. 20 tablet 0  . hydrochlorothiazide (HYDRODIURIL) 25 MG tablet TAKE 1 TABLET (25 MG TOTAL) BY MOUTH DAILY. 90 tablet 0  . ibuprofen (ADVIL,MOTRIN) 200 MG tablet Take 400 mg by mouth every 6 (six) hours as needed for moderate pain.    . Multiple Vitamins-Minerals (CENTRUM SILVER PO) Take 1 tablet by mouth daily. Take one by mouth daily    . NORVASC 5 MG tablet 1-2 tabs by mouth daily. (Patient taking differently: Take 5 mg by mouth daily. ) 90 tablet 0  . ondansetron (ZOFRAN ODT) 4 MG disintegrating tablet Take 1 tablet (4 mg total) by mouth every 4 (four) hours as needed for nausea or vomiting. 20 tablet 0  . potassium chloride SA (K-DUR,KLOR-CON) 20 MEQ tablet TAKE 1 TABLET EVERY DAY (Patient taking differently: 40 mEq. TAKE 1 TABLET EVERY  DAY) 90 tablet 1   No current facility-administered medications on file prior to visit.    Allergies  Allergen Reactions  . Lisinopril     REACTION: cough    Past Medical History  Diagnosis Date  . Allergic rhinitis   . Hyperlipidemia   . Hypertension     Past Surgical History  Procedure Laterality Date  . Gyn surgery  2002    hysterectomy    Family History  Problem Relation Age of Onset  . Heart attack Brother     History   Social History  . Marital Status: Married    Spouse Name: N/A    Number of Children: 1  . Years of Education: N/A   Occupational History  . Not on file.   Social History Main Topics  . Smoking status: Never Smoker   . Smokeless tobacco: Not on file  . Alcohol Use: No  . Drug Use: No  . Sexual Activity: Not on file   Other Topics Concern  . Not on file   Social History Narrative   The PMH, PSH, Social History, Family History, Medications, and allergies have been reviewed in [facility], and have been updated if relevant.   Review of Systems  Constitutional: Negative.   HENT: Negative for sinus pressure, tinnitus, trouble swallowing and voice change.   Neurological: Positive for dizziness. Negative for tremors, seizures, syncope, facial asymmetry, speech difficulty,  weakness, light-headedness, numbness and headaches.  All other systems reviewed and are negative.      Objective:    BP 134/72 mmHg  Pulse 105  Temp(Src) 98 F (36.7 C) (Oral)  Wt 129 lb 8 oz (58.741 kg)  SpO2 97%   Physical Exam  Constitutional: She is oriented to person, place, and time. She appears well-developed and well-nourished. No distress.  HENT:  Head: Normocephalic and atraumatic.  + dix hallpike- nyastagmus and symptoms triggered by turning head to left, neg right.  Cardiovascular: Regular rhythm.   Pulmonary/Chest: Effort normal.  Neurological: She is alert and oriented to person, place, and time. She displays normal reflexes.  Psychiatric: She has a  normal mood and affect. Her behavior is normal. Judgment and thought content normal.  Nursing note and vitals reviewed.         Assessment & Plan:   Benign paroxysmal positional vertigo, unspecified laterality - Plan: Basic Metabolic Panel  Hypokalemia - Plan: Basic Metabolic Panel No Follow-up on file.

## 2014-08-02 NOTE — Assessment & Plan Note (Signed)
New- MRI/MRA reassuring. Continue meclizine for now. If symptoms do not continue to improve or worsen, refer for vestibular rehab. Advised not driving until symptoms resolved. The patient indicates understanding of these issues and agrees with the plan.

## 2014-08-02 NOTE — Progress Notes (Signed)
Pre visit review using our clinic review tool, if applicable. No additional management support is needed unless otherwise documented below in the visit note. 

## 2014-08-03 LAB — BASIC METABOLIC PANEL
BUN: 16 mg/dL (ref 6–23)
CO2: 28 mEq/L (ref 19–32)
Calcium: 9.3 mg/dL (ref 8.4–10.5)
Chloride: 98 mEq/L (ref 96–112)
Creatinine, Ser: 0.8 mg/dL (ref 0.4–1.2)
GFR: 72.55 mL/min (ref >60.00)
Glucose, Bld: 98 mg/dL (ref 70–99)
Potassium: 3.9 mEq/L (ref 3.5–5.1)
Sodium: 135 mEq/L (ref 135–145)

## 2014-08-09 ENCOUNTER — Other Ambulatory Visit: Payer: Self-pay | Admitting: Family Medicine

## 2014-08-09 DIAGNOSIS — E876 Hypokalemia: Secondary | ICD-10-CM

## 2014-08-10 ENCOUNTER — Other Ambulatory Visit (INDEPENDENT_AMBULATORY_CARE_PROVIDER_SITE_OTHER): Payer: 59

## 2014-08-10 ENCOUNTER — Encounter: Payer: Self-pay | Admitting: Family Medicine

## 2014-08-10 DIAGNOSIS — E876 Hypokalemia: Secondary | ICD-10-CM

## 2014-08-10 LAB — BASIC METABOLIC PANEL
BUN: 14 mg/dL (ref 6–23)
CO2: 31 mEq/L (ref 19–32)
Calcium: 9.2 mg/dL (ref 8.4–10.5)
Chloride: 98 mEq/L (ref 96–112)
Creatinine, Ser: 0.8 mg/dL (ref 0.4–1.2)
GFR: 79.11 mL/min (ref >60.00)
Glucose, Bld: 92 mg/dL (ref 70–99)
Potassium: 3.6 mEq/L (ref 3.5–5.1)
Sodium: 135 mEq/L (ref 135–145)

## 2014-10-27 ENCOUNTER — Other Ambulatory Visit: Payer: Self-pay | Admitting: Family Medicine

## 2014-11-23 ENCOUNTER — Telehealth: Payer: Self-pay | Admitting: *Deleted

## 2014-11-23 NOTE — Telephone Encounter (Signed)
Called patient to inquire about flu vaccine. No answer, no machine. Unable to leave message.

## 2015-01-24 ENCOUNTER — Ambulatory Visit (INDEPENDENT_AMBULATORY_CARE_PROVIDER_SITE_OTHER): Payer: 59 | Admitting: Family Medicine

## 2015-01-24 ENCOUNTER — Encounter: Payer: Self-pay | Admitting: Family Medicine

## 2015-01-24 VITALS — BP 132/72 | HR 85 | Temp 98.0°F | Wt 131.8 lb

## 2015-01-24 DIAGNOSIS — I10 Essential (primary) hypertension: Secondary | ICD-10-CM

## 2015-01-24 DIAGNOSIS — M6248 Contracture of muscle, other site: Secondary | ICD-10-CM | POA: Diagnosis not present

## 2015-01-24 DIAGNOSIS — M62838 Other muscle spasm: Secondary | ICD-10-CM

## 2015-01-24 DIAGNOSIS — E785 Hyperlipidemia, unspecified: Secondary | ICD-10-CM

## 2015-01-24 LAB — COMPREHENSIVE METABOLIC PANEL
ALT: 22 U/L (ref 0–35)
AST: 25 U/L (ref 0–37)
Albumin: 4.3 g/dL (ref 3.5–5.2)
Alkaline Phosphatase: 72 U/L (ref 39–117)
BUN: 15 mg/dL (ref 6–23)
CO2: 33 mEq/L — ABNORMAL HIGH (ref 19–32)
Calcium: 9.8 mg/dL (ref 8.4–10.5)
Chloride: 98 mEq/L (ref 96–112)
Creatinine, Ser: 0.71 mg/dL (ref 0.40–1.20)
GFR: 86.76 mL/min (ref >60.00)
Glucose, Bld: 102 mg/dL — ABNORMAL HIGH (ref 70–99)
Potassium: 3.4 mEq/L — ABNORMAL LOW (ref 3.5–5.1)
Sodium: 137 mEq/L (ref 135–145)
Total Bilirubin: 0.5 mg/dL (ref 0.2–1.2)
Total Protein: 8.2 g/dL (ref 6.0–8.3)

## 2015-01-24 LAB — LIPID PANEL
Cholesterol: 191 mg/dL (ref 0–200)
HDL: 45 mg/dL (ref >39.00)
NonHDL: 146
Total CHOL/HDL Ratio: 4
Triglycerides: 228 mg/dL — ABNORMAL HIGH (ref 0.0–149.0)
VLDL: 45.6 mg/dL — ABNORMAL HIGH (ref 0.0–40.0)

## 2015-01-24 LAB — LDL CHOLESTEROL, DIRECT: Direct LDL: 113 mg/dL

## 2015-01-24 MED ORDER — POTASSIUM CHLORIDE CRYS ER 20 MEQ PO TBCR
EXTENDED_RELEASE_TABLET | ORAL | Status: DC
Start: 2015-01-24 — End: 2015-05-30

## 2015-01-24 MED ORDER — CYCLOBENZAPRINE HCL 5 MG PO TABS: 5.0000 mg | ORAL_TABLET | Freq: Every evening | ORAL | Status: DC | PRN

## 2015-01-24 MED ORDER — NORVASC 5 MG PO TABS: ORAL_TABLET | ORAL | Status: DC

## 2015-01-24 NOTE — Assessment & Plan Note (Signed)
Well controlled. No changes made today. 

## 2015-01-24 NOTE — Progress Notes (Signed)
69 yo here for follow up.   HTN-   Taking HCTZ 25 mg and Norvasc 5 mg daily for over a year. Also takes Klor Con 40 meq. Denies any CP, SOB, LE edema, blurred vision.  Lab Results  Component Value Date   CREATININE 0.8 08/10/2014   Lab Results  Component Value Date   NA 135 08/10/2014   K 3.6 08/10/2014   CL 98 08/10/2014   CO2 31 08/10/2014   HLD- taking Lipitor 10 mg daily.  Lab Results  Component Value Date   CHOL 203* 01/31/2014   HDL 44.40 01/31/2014   LDLCALC 123* 01/31/2014   TRIG 178.0* 01/31/2014   CHOLHDL 5 01/31/2014      Patient Active Problem List   Diagnosis Date Noted  . Hypokalemia 08/02/2014  . Benign paroxysmal positional vertigo 08/02/2014  . Muscle spasms of neck 01/31/2014  . DJD (degenerative joint disease) of cervical spine 02/17/2013  . HLD (hyperlipidemia) 11/08/2010  . Essential hypertension 11/08/2010  . ALLERGIC RHINITIS 11/08/2010   Past Medical History  Diagnosis Date  . Allergic rhinitis   . Hyperlipidemia   . Hypertension    Past Surgical History  Procedure Laterality Date  . Gyn surgery  2002    hysterectomy   History  Substance Use Topics  . Smoking status: Never Smoker   . Smokeless tobacco: Not on file  . Alcohol Use: No   Family History  Problem Relation Age of Onset  . Heart attack Brother    Allergies  Allergen Reactions  . Lisinopril     REACTION: cough   Current Outpatient Prescriptions on File Prior to Visit  Medication Sig Dispense Refill  . cetirizine (ZYRTEC) 10 MG tablet TAKE 1 TABLET (10 MG TOTAL) BY MOUTH DAILY. 90 tablet 0  . cyclobenzaprine (FLEXERIL) 5 MG tablet Take 1 tablet (5 mg total) by mouth at bedtime as needed for muscle spasms. 20 tablet 0  . hydrochlorothiazide (HYDRODIURIL) 25 MG tablet TAKE 1 TABLET (25 MG TOTAL) BY MOUTH DAILY. 90 tablet 3  . ibuprofen (ADVIL,MOTRIN) 200 MG tablet Take 400 mg by mouth every 6 (six) hours as needed for moderate pain.    Marland Kitchen LIPITOR 10 MG tablet TAKE 1  TABLET (10 MG TOTAL) BY MOUTH DAILY. 90 tablet 3  . meclizine (ANTIVERT) 25 MG tablet Take 1 tablet (25 mg total) by mouth every 6 (six) hours as needed for dizziness. 20 tablet 0  . Multiple Vitamins-Minerals (CENTRUM SILVER PO) Take 1 tablet by mouth daily. Take one by mouth daily    . ondansetron (ZOFRAN ODT) 4 MG disintegrating tablet Take 1 tablet (4 mg total) by mouth every 4 (four) hours as needed for nausea or vomiting. 20 tablet 0   No current facility-administered medications on file prior to visit.   The PMH, PSH, Social History, Family History, Medications, and allergies have been reviewed in [facility], and have been updated if relevant.  ROS:  Review of Systems  HENT: Negative.   Eyes: Negative.   Respiratory: Negative.   Cardiovascular: Negative.   Gastrointestinal: Negative.   Musculoskeletal: Positive for myalgias, neck pain and neck stiffness. Negative for back pain, joint swelling and gait problem.  Skin: Negative.   Neurological: Negative.   Hematological: Negative.   Psychiatric/Behavioral: Negative.      Physical exam: BP 132/72 mmHg  Pulse 85  Temp(Src) 98 F (36.7 C) (Oral)  Wt 131 lb 12 oz (59.761 kg)  SpO2 97%.  Wt Readings from Last 3 Encounters:  01/24/15 131 lb 12 oz (59.761 kg)  08/02/14 129 lb 8 oz (58.741 kg)  07/27/14 125 lb (56.7 kg)    Physical Exam  Constitutional: She is well-developed, well-nourished, and in no distress. No distress.  HENT:  Head: Normocephalic.  Eyes: Conjunctivae are normal.  Cardiovascular: Normal rate and regular rhythm.   Pulmonary/Chest: Effort normal.  Musculoskeletal:       Cervical back: She exhibits spasm. She exhibits normal range of motion, no tenderness, no bony tenderness, no laceration, no pain and normal pulse.  Neurological: She is alert.  Skin: Skin is warm and dry.  Psychiatric: Mood, memory, affect and judgment normal.  Nursing note and vitals reviewed.

## 2015-01-24 NOTE — Assessment & Plan Note (Signed)
Deteriorated. Flexeril prn has worked well for her- uses very occasionally along with heat and exercises. eRx sent.

## 2015-01-24 NOTE — Progress Notes (Signed)
Pre visit review using our clinic review tool, if applicable. No additional management support is needed unless otherwise documented below in the visit note. 

## 2015-01-24 NOTE — Assessment & Plan Note (Signed)
Continue lipitor. Has been well controlled. Recheck labs today. Orders Placed This Encounter  Procedures  . Lipid panel  . Comprehensive metabolic panel

## 2015-01-24 NOTE — Patient Instructions (Signed)
Good to see you. We will call you with your lab results and you can view them online. 

## 2015-01-25 ENCOUNTER — Encounter: Payer: Self-pay | Admitting: *Deleted

## 2015-03-23 ENCOUNTER — Telehealth: Payer: Self-pay

## 2015-03-23 NOTE — Telephone Encounter (Signed)
Left a message for patient to return my call about having a Mammogram set up. Will await call back.  

## 2015-04-24 ENCOUNTER — Other Ambulatory Visit: Payer: Self-pay | Admitting: Family Medicine

## 2015-05-30 ENCOUNTER — Emergency Department (HOSPITAL_COMMUNITY): Payer: 59

## 2015-05-30 ENCOUNTER — Emergency Department (HOSPITAL_COMMUNITY)
Admission: EM | Admit: 2015-05-30 | Discharge: 2015-05-30 | Disposition: A | Discharge status: Home / Self Care | Payer: 59 | Attending: Emergency Medicine | Admitting: Emergency Medicine

## 2015-05-30 ENCOUNTER — Encounter (HOSPITAL_COMMUNITY): Payer: Self-pay

## 2015-05-30 DIAGNOSIS — E876 Hypokalemia: Secondary | ICD-10-CM

## 2015-05-30 DIAGNOSIS — I10 Essential (primary) hypertension: Secondary | ICD-10-CM | POA: Insufficient documentation

## 2015-05-30 DIAGNOSIS — R002 Palpitations: Secondary | ICD-10-CM | POA: Diagnosis not present

## 2015-05-30 DIAGNOSIS — R312 Other microscopic hematuria: Secondary | ICD-10-CM | POA: Insufficient documentation

## 2015-05-30 DIAGNOSIS — R3129 Other microscopic hematuria: Secondary | ICD-10-CM

## 2015-05-30 DIAGNOSIS — R000 Tachycardia, unspecified: Secondary | ICD-10-CM | POA: Diagnosis present

## 2015-05-30 DIAGNOSIS — F419 Anxiety disorder, unspecified: Secondary | ICD-10-CM | POA: Insufficient documentation

## 2015-05-30 DIAGNOSIS — Z79899 Other long term (current) drug therapy: Secondary | ICD-10-CM | POA: Diagnosis not present

## 2015-05-30 LAB — CBC WITH DIFFERENTIAL/PLATELET
Basophils Absolute: 0 10*3/uL (ref 0.0–0.1)
Basophils Relative: 0 %
Eosinophils Absolute: 0.2 10*3/uL (ref 0.0–0.7)
Eosinophils Relative: 3 %
HCT: 36.6 % (ref 36.0–46.0)
Hemoglobin: 12.9 g/dL (ref 12.0–15.0)
Lymphocytes Relative: 25 %
Lymphs Abs: 1.6 10*3/uL (ref 0.7–4.0)
MCH: 29.1 pg (ref 26.0–34.0)
MCHC: 35.2 g/dL (ref 30.0–36.0)
MCV: 82.6 fL (ref 78.0–100.0)
Monocytes Absolute: 0.5 10*3/uL (ref 0.1–1.0)
Monocytes Relative: 7 %
Neutro Abs: 4.2 10*3/uL (ref 1.7–7.7)
Neutrophils Relative %: 65 %
Platelets: 293 10*3/uL (ref 150–400)
RBC: 4.43 MIL/uL (ref 3.87–5.11)
RDW: 12.3 % (ref 11.5–15.5)
WBC: 6.5 10*3/uL (ref 4.0–10.5)

## 2015-05-30 LAB — URINE MICROSCOPIC-ADD ON

## 2015-05-30 LAB — URINALYSIS, ROUTINE W REFLEX MICROSCOPIC
Bilirubin Urine: NEGATIVE
Glucose, UA: NEGATIVE mg/dL
Ketones, ur: NEGATIVE mg/dL
Leukocytes, UA: NEGATIVE
Nitrite: NEGATIVE
Protein, ur: NEGATIVE mg/dL
Specific Gravity, Urine: 1.005 (ref 1.005–1.030)
Urobilinogen, UA: 0.2 mg/dL (ref 0.0–1.0)
pH: 7 (ref 5.0–8.0)

## 2015-05-30 LAB — I-STAT TROPONIN, ED
Troponin i, poc: 0 ng/mL (ref 0.00–0.08)
Troponin i, poc: 0.01 ng/mL (ref 0.00–0.08)

## 2015-05-30 LAB — I-STAT CHEM 8, ED
BUN: 18 mg/dL (ref 6–20)
Calcium, Ion: 1.16 mmol/L (ref 1.13–1.30)
Chloride: 98 mmol/L — ABNORMAL LOW (ref 101–111)
Creatinine, Ser: 0.9 mg/dL (ref 0.44–1.00)
Glucose, Bld: 139 mg/dL — ABNORMAL HIGH (ref 65–99)
HCT: 39 % (ref 36.0–46.0)
Hemoglobin: 13.3 g/dL (ref 12.0–15.0)
Potassium: 2.9 mmol/L — ABNORMAL LOW (ref 3.5–5.1)
Sodium: 138 mmol/L (ref 135–145)
TCO2: 26 mmol/L (ref 0–100)

## 2015-05-30 LAB — MAGNESIUM: Magnesium: 2.1 mg/dL (ref 1.7–2.4)

## 2015-05-30 LAB — D-DIMER, QUANTITATIVE: D-Dimer, Quant: 0.39 ug/mL-FEU (ref 0.00–0.48)

## 2015-05-30 MED ORDER — POTASSIUM CHLORIDE CRYS ER 20 MEQ PO TBCR
20.0000 meq | EXTENDED_RELEASE_TABLET | Freq: Once | ORAL | Status: AC
  Administered 2015-05-30: 20 meq via ORAL
  Filled 2015-05-30: qty 1

## 2015-05-30 MED ORDER — POTASSIUM CHLORIDE CRYS ER 20 MEQ PO TBCR
EXTENDED_RELEASE_TABLET | ORAL | Status: DC
Start: 2015-05-30 — End: 2015-05-30

## 2015-05-30 MED ORDER — POTASSIUM CHLORIDE CRYS ER 20 MEQ PO TBCR: 20.0000 meq | EXTENDED_RELEASE_TABLET | Freq: Two times a day (BID) | ORAL | Status: DC

## 2015-05-30 MED ORDER — POTASSIUM CHLORIDE CRYS ER 20 MEQ PO TBCR
60.0000 meq | EXTENDED_RELEASE_TABLET | Freq: Once | ORAL | Status: AC
  Administered 2015-05-30: 60 meq via ORAL
  Filled 2015-05-30: qty 3

## 2015-05-30 MED ORDER — POTASSIUM CHLORIDE ER 20 MEQ PO TBCR: 10.0000 meq | EXTENDED_RELEASE_TABLET | Freq: Two times a day (BID) | ORAL | Status: DC

## 2015-05-30 MED ORDER — SODIUM CHLORIDE 0.9 % IV BOLUS (SEPSIS)
1000.0000 mL | Freq: Once | INTRAVENOUS | Status: AC
  Administered 2015-05-30: 1000 mL via INTRAVENOUS

## 2015-05-30 NOTE — ED Notes (Addendum)
Patient is alert and orientedx4.  Patient was explained discharge instructions and they understood them with no questions.  The patient's husband, Amyra Vantuyl is taking the patient home.

## 2015-05-30 NOTE — ED Notes (Signed)
Pt arrived via EMS from home c/o anxiety and tachycardia, htn.  Pt states she noticed her heart rate was fast and she started to get anxious after noticing her blood pressure was elevated as well.  HX of HTN, anxiety, vertigo, hypokalemia.  Recently finished a zpack for a UTI.

## 2015-05-30 NOTE — Discharge Instructions (Signed)

## 2015-05-30 NOTE — ED Provider Notes (Signed)
CSN: [id number]     Arrival date & time 05/30/15  0027 History  By signing my name below, I, Randa Evens, attest that this documentation has been prepared under the direction and in the presence of April Palumbo, MD. Electronically Signed: Randa Evens, ED Scribe. 05/30/2015. 1:21 AM.     Chief Complaint  Patient presents with  . Anxiety  . Tachycardia   Patient is a 69 y.o. female presenting with palpitations. The history is provided by the patient. No language interpreter was used.  Palpitations Palpitations quality:  Fast Onset quality:  At rest Chronicity:  New Context: caffeine   Relieved by:  Nothing Ineffective treatments:  Bed rest Associated symptoms: dizziness    HPI Comments: Robin Peck is a 69 y.o. female brought in by ambulance, who presents to the Emergency Department complaining of palpitations described as tachycardic. Pt states that her her heart was beating up to 125 beats per minutes. Pt states she tried resting with no relief. She states that her BP was also up to 195/95. Pt states she has some associated slight dizziness and lightheadedness. Pt does report recently driving to and from [place], [place] recently. Pt does report caffeine use yesterday morning. denies leg swelling or other related symptoms.    Past Medical History  Diagnosis Date  . Allergic rhinitis   . Hyperlipidemia   . Hypertension    Past Surgical History  Procedure Laterality Date  . Gyn surgery  2002    hysterectomy   Family History  Problem Relation Age of Onset  . Heart attack Brother    Social History  Substance Use Topics  . Smoking status: Never Smoker   . Smokeless tobacco: None  . Alcohol Use: No   OB History    No data available     Review of Systems  Cardiovascular: Positive for palpitations.  Neurological: Positive for dizziness and light-headedness.  All other systems reviewed and are negative.     Allergies  Lisinopril  Home Medications   Prior  to Admission medications   Medication Sig Start Date End Date Taking? Authorizing Provider  cetirizine (ZYRTEC) 10 MG tablet TAKE 1 TABLET (10 MG TOTAL) BY MOUTH DAILY. 04/29/14   Lucille Passy, MD  cyclobenzaprine (FLEXERIL) 5 MG tablet Take 1 tablet (5 mg total) by mouth at bedtime as needed for muscle spasms. 01/24/15   Lucille Passy, MD  hydrochlorothiazide (HYDRODIURIL) 25 MG tablet TAKE 1 TABLET (25 MG TOTAL) BY MOUTH DAILY. 10/28/14   Lucille Passy, MD  ibuprofen (ADVIL,MOTRIN) 200 MG tablet Take 400 mg by mouth every 6 (six) hours as needed for moderate pain.    Historical Provider, MD  LIPITOR 10 MG tablet TAKE 1 TABLET (10 MG TOTAL) BY MOUTH DAILY. 10/28/14   Lucille Passy, MD  meclizine (ANTIVERT) 25 MG tablet Take 1 tablet (25 mg total) by mouth every 6 (six) hours as needed for dizziness. 08/02/14   Lucille Passy, MD  Multiple Vitamins-Minerals (CENTRUM SILVER PO) Take 1 tablet by mouth daily. Take one by mouth daily    Historical Provider, MD  NORVASC 5 MG tablet TAKE 1 TO 2 TABLETS BY MOUTH DAILY 04/24/15   Lucille Passy, MD  ondansetron (ZOFRAN ODT) 4 MG disintegrating tablet Take 1 tablet (4 mg total) by mouth every 4 (four) hours as needed for nausea or vomiting. 07/31/14   Charlesetta Shanks, MD  potassium chloride SA (K-DUR,KLOR-CON) 20 MEQ tablet TAKE 1 TABLET EVERY DAY 01/24/15  Lucille Passy, MD   BP 144/59 mmHg  Pulse 90  Resp 14  SpO2 98%   Physical Exam  Constitutional: She is oriented to person, place, and time. She appears well-developed and well-nourished. No distress.  HENT:  Head: Normocephalic and atraumatic.  Mouth/Throat: Uvula is midline and oropharynx is clear and moist.  Trachea midline.   Eyes: Conjunctivae and EOM are normal. Pupils are equal, round, and reactive to light.  Neck: Normal range of motion. Neck supple. No tracheal deviation present.  Cardiovascular: Normal rate, regular rhythm, normal heart sounds and intact distal pulses.   Pulmonary/Chest: Effort  normal and breath sounds normal. No respiratory distress. She has no wheezes. She has no rales.  Abdominal: Soft. Bowel sounds are increased. There is no tenderness. There is no rebound and no guarding.  Musculoskeletal: Normal range of motion. She exhibits no edema or tenderness.  Neurological: She is alert and oriented to person, place, and time. She has normal reflexes.  Skin: Skin is warm and dry.  Psychiatric: She has a normal mood and affect. Her behavior is normal.  Nursing note and vitals reviewed.   ED Course  Procedures (including critical care time) DIAGNOSTIC STUDIES: Oxygen Saturation is 95% on RA, normal by my interpretation.    COORDINATION OF CARE: 1:12 AM-Discussed treatment plan with pt at bedside and pt agreed to plan.     Labs Review Labs Reviewed  I-STAT CHEM 8, ED - Abnormal; Notable for the following:    Potassium 2.9 (*)    Chloride 98 (*)    Glucose, Bld 139 (*)    All other components within normal limits  CBC WITH DIFFERENTIAL/PLATELET  URINALYSIS, ROUTINE W REFLEX MICROSCOPIC (NOT AT [facility])  D-DIMER, QUANTITATIVE (NOT AT [facility])  Randolm Idol, ED    Imaging Review Dg Chest 2 View  05/30/2015   CLINICAL DATA:  Anxiety with tachycardia and hypertension.  EXAM: CHEST  2 VIEW  COMPARISON:  None.  FINDINGS: Borderline cardiomegaly with tortuous thoracic aorta. Atherosclerotic calcifications of the aortic arch. No pulmonary edema, confluent airspace disease, pleural effusion or pneumothorax. No acute osseous abnormalities are seen.  IMPRESSION: Borderline cardiomegaly and tortuous thoracic aorta. No acute pulmonary process.   Electronically Signed   By: Jeb Levering M.D.   On: 05/30/2015 01:10      EKG Interpretation   Date/Time:  Tuesday May 30 2015 01:18:26 EDT Ventricular Rate:  89 PR Interval:  162 QRS Duration: 106 QT Interval:  382 QTC Calculation: 465 R Axis:   75 Text Interpretation:  Sinus rhythm Confirmed by [facility]  MD,  APRIL  ([id number]) on 05/30/2015 1:21:54 AM         MDM   Final diagnoses:  None    Rules out for MI and PE.  Potassium repleted, rx for BID potassium provided.  Follow up with cardiology for event monitoring and urology for microscopic hematuria and your PMD for recheck in 2 days strict return precautions given.    I personally performed the services described in this documentation, which was scribed in my presence. The recorded information has been reviewed and is accurate.       Veatrice Kells, MD 05/30/15 [phone]

## 2015-06-01 ENCOUNTER — Encounter: Payer: Self-pay | Admitting: Family Medicine

## 2015-06-01 ENCOUNTER — Ambulatory Visit (INDEPENDENT_AMBULATORY_CARE_PROVIDER_SITE_OTHER): Payer: 59 | Admitting: Family Medicine

## 2015-06-01 VITALS — BP 138/70 | HR 89 | Temp 98.6°F | Wt 127.0 lb

## 2015-06-01 DIAGNOSIS — R319 Hematuria, unspecified: Secondary | ICD-10-CM

## 2015-06-01 DIAGNOSIS — F430 Acute stress reaction: Secondary | ICD-10-CM

## 2015-06-01 DIAGNOSIS — R002 Palpitations: Secondary | ICD-10-CM | POA: Diagnosis not present

## 2015-06-01 DIAGNOSIS — F410 Panic disorder [episodic paroxysmal anxiety] without agoraphobia: Secondary | ICD-10-CM | POA: Diagnosis not present

## 2015-06-01 DIAGNOSIS — F064 Anxiety disorder due to known physiological condition: Secondary | ICD-10-CM | POA: Insufficient documentation

## 2015-06-01 DIAGNOSIS — E876 Hypokalemia: Secondary | ICD-10-CM

## 2015-06-01 LAB — BASIC METABOLIC PANEL
BUN: 14 mg/dL (ref 6–23)
CO2: 32 mEq/L (ref 19–32)
Calcium: 10 mg/dL (ref 8.4–10.5)
Chloride: 102 mEq/L (ref 96–112)
Creatinine, Ser: 0.74 mg/dL (ref 0.40–1.20)
GFR: 82.63 mL/min (ref >60.00)
Glucose, Bld: 104 mg/dL — ABNORMAL HIGH (ref 70–99)
Potassium: 3.7 mEq/L (ref 3.5–5.1)
Sodium: 140 mEq/L (ref 135–145)

## 2015-06-01 MED ORDER — ALPRAZOLAM 0.25 MG PO TABS: 0.2500 mg | ORAL_TABLET | Freq: Two times a day (BID) | ORAL | Status: DC | PRN

## 2015-06-01 NOTE — Progress Notes (Signed)
Subjective:   Patient ID: Robin Peck, female    DOB: 05-01-1946, 69 y.o.   MRN: [id number]  Robin Peck is a pleasant 69 y.o. year old female who presents to clinic today with ed follow-up  on 06/01/2015  HPI:  Was seen in ER for palpitations on 05/30/15.  Notes and studies/labs reviewed.  Brought to ER via ambulance complaining of palpitations and elevated blood pressure with associated dizziness and lightheadedness.  She had been driving from [place] the day prior.  Dg Chest 2 View  05/30/2015   CLINICAL DATA:  Anxiety with tachycardia and hypertension.  EXAM: CHEST  2 VIEW  COMPARISON:  None.  FINDINGS: Borderline cardiomegaly with tortuous thoracic aorta. Atherosclerotic calcifications of the aortic arch. No pulmonary edema, confluent airspace disease, pleural effusion or pneumothorax. No acute osseous abnormalities are seen.  IMPRESSION: Borderline cardiomegaly and tortuous thoracic aorta. No acute pulmonary process.   Electronically Signed   By: Jeb Levering M.D.   On: 05/30/2015 01:10   D dimer negative, EKG NSR, neg troponin. Potassium low at 2.9- repleted, twice daily potassium provided and advised to follow up with cardiology for event monitoring.  Urine micro pos for 7-10 RBCs.  No dysuria currently, did have some weeks ago.  Has been under more stress lately.  Just found out that her brother in law has terminal cancer and was given only a few months to live.  Since she found this out, she is constantly tearful, feels anxious and her heart is racing.  She is not sleeping well.  Denies SI or HI.  Current Outpatient Prescriptions on File Prior to Visit  Medication Sig Dispense Refill  . amLODipine (NORVASC) 5 MG tablet Take 5 mg by mouth daily.    . cetirizine (ZYRTEC) 10 MG tablet TAKE 1 TABLET (10 MG TOTAL) BY MOUTH DAILY. 90 tablet 0  . hydrochlorothiazide (HYDRODIURIL) 25 MG tablet TAKE 1 TABLET (25 MG TOTAL) BY MOUTH DAILY. 90 tablet 3  . LIPITOR 10 MG  tablet TAKE 1 TABLET (10 MG TOTAL) BY MOUTH DAILY. 90 tablet 3  . potassium chloride SA (K-DUR,KLOR-CON) 20 MEQ tablet Take 1 tablet (20 mEq total) by mouth 2 (two) times daily. 30 tablet 0  . [DISCONTINUED] potassium chloride 20 MEQ TBCR Take 10 mEq by mouth 2 (two) times daily. 30 tablet 0   No current facility-administered medications on file prior to visit.    Allergies  Allergen Reactions  . Lisinopril     REACTION: cough    Past Medical History  Diagnosis Date  . Allergic rhinitis   . Hyperlipidemia   . Hypertension     Past Surgical History  Procedure Laterality Date  . Gyn surgery  2002    hysterectomy    Family History  Problem Relation Age of Onset  . Heart attack Brother     Social History   Social History  . Marital Status: Married    Spouse Name: N/A  . Number of Children: 1  . Years of Education: N/A   Occupational History  . Not on file.   Social History Main Topics  . Smoking status: Never Smoker   . Smokeless tobacco: Not on file  . Alcohol Use: No  . Drug Use: No  . Sexual Activity: Not on file   Other Topics Concern  . Not on file   Social History Narrative   The PMH, PSH, Social History, Family History, Medications, and allergies have been reviewed in [place], and  have been updated if relevant.   Review of Systems  Constitutional: Negative.   HENT: Negative.   Eyes: Negative.   Respiratory: Negative.   Cardiovascular: Positive for palpitations. Negative for chest pain and leg swelling.  Endocrine: Negative.   Genitourinary: Negative.   Musculoskeletal: Negative.   Skin: Negative.   Allergic/Immunologic: Negative.   Neurological: Negative.   Hematological: Negative.   Psychiatric/Behavioral: Positive for sleep disturbance and dysphoric mood. Negative for suicidal ideas and self-injury. The patient is nervous/anxious.   All other systems reviewed and are negative.      Objective:    BP 138/70 mmHg  Pulse 89  Temp(Src) 98.6  F (37 C) (Tympanic)  Wt 127 lb (57.607 kg)  SpO2 96%   Physical Exam  Constitutional: She is oriented to person, place, and time. She appears well-developed and well-nourished. No distress.  HENT:  Head: Normocephalic.  Eyes: Conjunctivae are normal.  Neck: Normal range of motion.  Cardiovascular: Normal rate, regular rhythm and normal heart sounds.   Pulmonary/Chest: Effort normal.  Musculoskeletal: Normal range of motion.  Neurological: She is alert and oriented to person, place, and time. No cranial nerve deficit.  Skin: Skin is warm and dry. No erythema.  Psychiatric:  Tearful but appropriate  Nursing note and vitals reviewed.         Assessment & Plan:   Hypokalemia - Plan: Basic metabolic panel  Palpitations  Hematuria - Plan: Ambulatory referral to Urology  Panic attack as reaction to stress No Follow-up on file.

## 2015-06-01 NOTE — Patient Instructions (Signed)
Good to see you. I will call you with your potassium results.  Please stop by to see Robin Peck to schedule your urology referral.

## 2015-06-01 NOTE — Assessment & Plan Note (Signed)
New and likely part of contributing factors leading to her palpitations. Discussed starting an SSRI and psychotherapy but she is declining those at this time.  Asking for short term prn rx. Rx given for xanax 0.25 mg twice daily prn panic attack and insomnia. Aware of sedation and addiction potential.

## 2015-06-01 NOTE — Assessment & Plan Note (Signed)
Recheck potassium today, likely will decrease dose back to daily dosing. The patient indicates understanding of these issues and agrees with the plan. Orders Placed This Encounter  Procedures  . Basic metabolic panel  . Ambulatory referral to Urology

## 2015-06-01 NOTE — Assessment & Plan Note (Signed)
New- refer to urology for further work up.

## 2015-06-02 ENCOUNTER — Other Ambulatory Visit: Payer: Self-pay | Admitting: Family Medicine

## 2015-06-02 DIAGNOSIS — E876 Hypokalemia: Secondary | ICD-10-CM

## 2015-06-07 ENCOUNTER — Encounter: Payer: Self-pay | Admitting: Obstetrics and Gynecology

## 2015-06-07 ENCOUNTER — Ambulatory Visit (INDEPENDENT_AMBULATORY_CARE_PROVIDER_SITE_OTHER): Payer: Commercial Managed Care - HMO | Admitting: Obstetrics and Gynecology

## 2015-06-07 VITALS — BP 153/74 | HR 69 | Resp 16 | Ht 60.0 in | Wt 125.8 lb

## 2015-06-07 DIAGNOSIS — R3129 Other microscopic hematuria: Secondary | ICD-10-CM

## 2015-06-07 LAB — URINALYSIS, COMPLETE
Bilirubin, UA: NEGATIVE
Glucose, UA: NEGATIVE
Ketones, UA: NEGATIVE
Leukocytes, UA: NEGATIVE
Nitrite, UA: NEGATIVE
Protein, UA: NEGATIVE
Specific Gravity, UA: 1.015 (ref 1.005–1.030)
Urobilinogen, Ur: 0.2 mg/dL (ref 0.2–1.0)
pH, UA: 7 (ref 5.0–7.5)

## 2015-06-07 LAB — MICROSCOPIC EXAMINATION
Bacteria, UA: NONE SEEN
Epithelial Cells (non renal): NONE SEEN /hpf (ref 0–10)
Renal Epithel, UA: NONE SEEN /hpf

## 2015-06-07 NOTE — Progress Notes (Signed)
06/07/2015 2:23 PM   Robin Peck [birth year] [id number]  Referring provider: Lucille Passy, MD [place],  [id number]  Chief Complaint  Patient presents with  . Hematuria  . Establish Care    HPI: Patient is a 69 year old female presenting today as a referral from the emergency department for microscopic hematuria. She was seen in the emergency department on 05/30/15 with complaints of tachycardia and acute hypertension. She was found to be hypokalemic with a potassium value of 2.9. Patient states that her symptoms eventually resolved and she was discharged with by mouth potassium instructed to follow up with urology for microscopic hematuria.  Patient denies any significant urologic history other then urinary tract infections approximately 1 every 3 years or so. No history of renal stones. No vaginal complaints. She was never a smoker or had significant chemical exposures.  05/30/15 UA RBC 7-10, WBC 0-2, Bacteria- rare 06/01/15 Cr 0.74   PMH: Past Medical History  Diagnosis Date  . Allergic rhinitis   . Hyperlipidemia   . Hypertension     Surgical History: Past Surgical History  Procedure Laterality Date  . Gyn surgery  2002    hysterectomy    Home Medications:    Medication List       This list is accurate as of: 06/07/15  2:23 PM.  Always use your most recent med list.               ALPRAZolam 0.25 MG tablet  Commonly known as:  XANAX  Take 1 tablet (0.25 mg total) by mouth 2 (two) times daily as needed for anxiety or sleep.     amLODipine 5 MG tablet  Commonly known as:  NORVASC  Take 5 mg by mouth daily.     cetirizine 10 MG tablet  Commonly known as:  ZYRTEC  TAKE 1 TABLET (10 MG TOTAL) BY MOUTH DAILY.     hydrochlorothiazide 25 MG tablet  Commonly known as:  HYDRODIURIL  TAKE 1 TABLET (25 MG TOTAL) BY MOUTH DAILY.     LIPITOR 10 MG tablet  Generic drug:  atorvastatin  TAKE 1 TABLET (10 MG TOTAL) BY MOUTH DAILY.     potassium chloride SA 20 MEQ tablet  Commonly known as:  K-DUR,KLOR-CON  Take 1 tablet (20 mEq total) by mouth 2 (two) times daily.        Allergies:  Allergies  Allergen Reactions  . Lisinopril     REACTION: cough    Family History: Family History  Problem Relation Age of Onset  . Heart attack Brother   . Hypertension Brother   . Hypertension Father   . Hypertension Mother   . Hypertension Sister     Social History:  reports that she has never smoked. She does not have any smokeless tobacco history on file. She reports that she does not drink alcohol or use illicit drugs.  ROS: UROLOGY Frequent Urination?: No Hard to postpone urination?: No Burning/pain with urination?: No Get up at night to urinate?: No Leakage of urine?: No Urine stream starts and stops?: No Trouble starting stream?: No Do you have to strain to urinate?: No Blood in urine?: Yes Urinary tract infection?: No Sexually transmitted disease?: No Injury to kidneys or bladder?: No Painful intercourse?: No Weak stream?: No Currently pregnant?: No Vaginal bleeding?: No Last menstrual period?: n  Gastrointestinal Nausea?: No Vomiting?: No Indigestion/heartburn?: No Diarrhea?: No Constipation?: No  Constitutional Fever: No Night sweats?: No Weight loss?: No Fatigue?: No  Skin Skin rash/lesions?: No Itching?: No  Eyes Blurred vision?: No Double vision?: No  Ears/Nose/Throat Sore throat?: No Sinus problems?: No  Hematologic/Lymphatic Swollen glands?: No Easy bruising?: No  Cardiovascular Leg swelling?: No Chest pain?: No  Respiratory Cough?: No Shortness of breath?: No  Endocrine Excessive thirst?: No  Musculoskeletal Back pain?: No Joint pain?: No  Neurological Headaches?: No Dizziness?: No  Psychologic Depression?: No Anxiety?: No  Physical Exam: BP 153/74 mmHg  Pulse 69  Resp 16  Ht 5' (1.524 m)  Wt 125 lb 12.8 oz (57.063 kg)  BMI 24.57 kg/m2    Constitutional:  Alert and oriented, No acute distress. HEENT: [place] AT, moist mucus membranes.  Trachea midline, no masses. Cardiovascular: No clubbing, cyanosis, or edema. Respiratory: Normal respiratory effort, no increased work of breathing. GI: Abdomen is soft, nontender, nondistended, no abdominal masses GU: No CVA tenderness.  Skin: No rashes, bruises or suspicious lesions. Lymph: No cervical or inguinal adenopathy. Neurologic: Grossly intact, no focal deficits, moving all 4 extremities. Psychiatric: Normal mood and affect.  Laboratory Data: Lab Results  Component Value Date   WBC 6.5 05/30/2015   HGB 13.3 05/30/2015   HCT 39.0 05/30/2015   MCV 82.6 05/30/2015   PLT 293 05/30/2015    Lab Results  Component Value Date   CREATININE 0.74 06/01/2015    No results found for: PSA  No results found for: TESTOSTERONE  No results found for: HGBA1C  Urinalysis  Pertinent Imaging:   Assessment & Plan:    1. Microscopic Hematuria-  0-2 RBCs seen today.05/30/15 UA RBC 7-10, WBC 0-2, Bacteria- rare. We discussed the differential diagnosis for microscopic hematuria including nephrolithiasis, renal or upper tract tumors, bladder stones, UTIs, or bladder tumors as well as undetermined etiologies. Per AUA guidelines, I did recommend complete microscopic hematuria evaluation including CTU, possible urine cytology, and office cystoscopy. Patient states understanding and is willing to proceed. -CT urogram ordered -Will schedule cystoscopy  There are no diagnoses linked to this encounter.  Return for f/u CT Urogram results; schedule cystoscopy.  Herbert Moors, [facility] [address], [place], [place] [address] [phone]

## 2015-06-13 ENCOUNTER — Ambulatory Visit
Admission: RE | Admit: 2015-06-13 | Discharge: 2015-06-13 | Disposition: A | Discharge status: Home / Self Care | Payer: 59 | Source: Ambulatory Visit | Attending: Obstetrics and Gynecology | Admitting: Obstetrics and Gynecology

## 2015-06-13 DIAGNOSIS — R3129 Other microscopic hematuria: Secondary | ICD-10-CM

## 2015-06-13 DIAGNOSIS — R911 Solitary pulmonary nodule: Secondary | ICD-10-CM | POA: Insufficient documentation

## 2015-06-13 MED ORDER — IOHEXOL 300 MG/ML  SOLN
150.0000 mL | Freq: Once | INTRAMUSCULAR | Status: AC | PRN
  Administered 2015-06-13: 110 mL via INTRAVENOUS

## 2015-06-15 ENCOUNTER — Other Ambulatory Visit (INDEPENDENT_AMBULATORY_CARE_PROVIDER_SITE_OTHER): Payer: 59

## 2015-06-15 DIAGNOSIS — E876 Hypokalemia: Secondary | ICD-10-CM | POA: Diagnosis not present

## 2015-06-15 LAB — BASIC METABOLIC PANEL
BUN: 14 mg/dL (ref 6–23)
CO2: 33 mEq/L — ABNORMAL HIGH (ref 19–32)
Calcium: 9.6 mg/dL (ref 8.4–10.5)
Chloride: 101 mEq/L (ref 96–112)
Creatinine, Ser: 0.72 mg/dL (ref 0.40–1.20)
GFR: 85.27 mL/min (ref >60.00)
Glucose, Bld: 97 mg/dL (ref 70–99)
Potassium: 3.5 mEq/L (ref 3.5–5.1)
Sodium: 140 mEq/L (ref 135–145)

## 2015-06-16 ENCOUNTER — Other Ambulatory Visit: Payer: 59

## 2015-06-19 MED ORDER — POTASSIUM CHLORIDE CRYS ER 20 MEQ PO TBCR: 20.0000 meq | EXTENDED_RELEASE_TABLET | Freq: Two times a day (BID) | ORAL | Status: DC

## 2015-06-19 NOTE — Addendum Note (Signed)
Addended by: Modena Nunnery on: 06/19/2015 12:25 PM   Modules accepted: Orders

## 2015-06-29 ENCOUNTER — Encounter: Payer: Self-pay | Admitting: Urology

## 2015-06-29 ENCOUNTER — Ambulatory Visit (INDEPENDENT_AMBULATORY_CARE_PROVIDER_SITE_OTHER): Payer: Commercial Managed Care - HMO | Admitting: Urology

## 2015-06-29 VITALS — BP 151/73 | HR 69 | Ht 60.0 in | Wt 130.4 lb

## 2015-06-29 DIAGNOSIS — R3129 Other microscopic hematuria: Secondary | ICD-10-CM | POA: Diagnosis not present

## 2015-06-29 LAB — URINALYSIS, COMPLETE
Bilirubin, UA: NEGATIVE
Glucose, UA: NEGATIVE
Ketones, UA: NEGATIVE
Leukocytes, UA: NEGATIVE
Nitrite, UA: NEGATIVE
Protein, UA: NEGATIVE
Specific Gravity, UA: 1.015 (ref 1.005–1.030)
Urobilinogen, Ur: 0.2 mg/dL (ref 0.2–1.0)
pH, UA: 7 (ref 5.0–7.5)

## 2015-06-29 LAB — MICROSCOPIC EXAMINATION
Bacteria, UA: NONE SEEN
Epithelial Cells (non renal): NONE SEEN /hpf (ref 0–10)
Renal Epithel, UA: NONE SEEN /hpf
WBC, UA: NONE SEEN /hpf (ref 0–?)

## 2015-06-29 MED ORDER — CIPROFLOXACIN HCL 500 MG PO TABS
500.0000 mg | ORAL_TABLET | Freq: Once | ORAL | Status: AC
  Administered 2015-06-29: 500 mg via ORAL

## 2015-06-29 MED ORDER — LIDOCAINE HCL 2 % EX GEL
1.0000 "application " | Freq: Once | CUTANEOUS | Status: AC
  Administered 2015-06-29: 1 via URETHRAL

## 2015-06-29 NOTE — Progress Notes (Signed)
06/29/2015 9:48 AM   Robin Peck [birth year] [id number]  Referring provider: Lucille Passy, MD [place], [place] [address]  Chief Complaint  Patient presents with  . Cysto    HPI: Patient is a 69 year old female presenting today as a referral from the emergency department for microscopic hematuria. She was seen in the emergency department on 05/30/15 with complaints of tachycardia and acute hypertension. She was found to be hypokalemic with a potassium value of 2.9. Patient states that her symptoms eventually resolved and she was discharged with by mouth potassium instructed to follow up with urology for microscopic hematuria.  Patient denies any significant urologic history other then urinary tract infections approximately 1 every 3 years or so. No history of renal stones. No vaginal complaints. She was never a smoker or had significant chemical exposures.  05/30/15 UA RBC 7-10, WBC 0-2, Bacteria- rare 06/01/15 Cr 0.74  Interval History: The patient presents for completion of her hematuria workup. She continues to have 3-10 red blood cells on her UA today. Her CT urogram was negative.   PMH: Past Medical History  Diagnosis Date  . Allergic rhinitis   . Hyperlipidemia   . Hypertension     Surgical History: Past Surgical History  Procedure Laterality Date  . Gyn surgery  2002    hysterectomy    Home Medications:    Medication List       This list is accurate as of: 06/29/15  9:48 AM.  Always use your most recent med list.               ALPRAZolam 0.25 MG tablet  Commonly known as:  XANAX  Take 1 tablet (0.25 mg total) by mouth 2 (two) times daily as needed for anxiety or sleep.     amLODipine 5 MG tablet  Commonly known as:  NORVASC  Take 5 mg by mouth daily.     cetirizine 10 MG tablet  Commonly known as:  ZYRTEC  TAKE 1 TABLET (10 MG TOTAL) BY MOUTH DAILY.     hydrochlorothiazide 25 MG tablet  Commonly known as:  HYDRODIURIL  TAKE 1  TABLET (25 MG TOTAL) BY MOUTH DAILY.     LIPITOR 10 MG tablet  Generic drug:  atorvastatin  TAKE 1 TABLET (10 MG TOTAL) BY MOUTH DAILY.     potassium chloride SA 20 MEQ tablet  Commonly known as:  K-DUR,KLOR-CON  Take 1 tablet (20 mEq total) by mouth 2 (two) times daily.        Allergies:  Allergies  Allergen Reactions  . Lisinopril     REACTION: cough    Family History: Family History  Problem Relation Age of Onset  . Heart attack Brother   . Hypertension Brother   . Hypertension Father   . Hypertension Mother   . Hypertension Sister     Social History:  reports that she has never smoked. She does not have any smokeless tobacco history on file. She reports that she does not drink alcohol or use illicit drugs.  ROS: UROLOGY Frequent Urination?: No Hard to postpone urination?: No Burning/pain with urination?: No Get up at night to urinate?: No Leakage of urine?: No Urine stream starts and stops?: No Trouble starting stream?: No Do you have to strain to urinate?: No Blood in urine?: No Urinary tract infection?: No Sexually transmitted disease?: No Injury to kidneys or bladder?: No Painful intercourse?: No Weak stream?: No Currently pregnant?: No Vaginal bleeding?: No Last menstrual  period?: n  Gastrointestinal Nausea?: No Vomiting?: No Indigestion/heartburn?: No Diarrhea?: No Constipation?: No  Constitutional Fever: No Night sweats?: No Weight loss?: No Fatigue?: No  Skin Skin rash/lesions?: No Itching?: No  Eyes Blurred vision?: No Double vision?: No  Ears/Nose/Throat Sore throat?: No Sinus problems?: No  Hematologic/Lymphatic Swollen glands?: No Easy bruising?: No  Cardiovascular Leg swelling?: No Chest pain?: No  Respiratory Cough?: No Shortness of breath?: No  Endocrine Excessive thirst?: No  Musculoskeletal Back pain?: No Joint pain?: No  Neurological Headaches?: No Dizziness?: No  Psychologic Depression?:  No Anxiety?: No  Physical Exam: BP 151/73 mmHg  Pulse 69  Ht 5' (1.524 m)  Wt 130 lb 6.4 oz (59.149 kg)  BMI 25.47 kg/m2  Constitutional:  Alert and oriented, No acute distress. HEENT: [place] AT, moist mucus membranes.  Trachea midline, no masses. Cardiovascular: No clubbing, cyanosis, or edema. Respiratory: Normal respiratory effort, no increased work of breathing. GI: Abdomen is soft, nontender, nondistended, no abdominal masses GU: No CVA tenderness.  Skin: No rashes, bruises or suspicious lesions. Lymph: No cervical or inguinal adenopathy. Neurologic: Grossly intact, no focal deficits, moving all 4 extremities. Psychiatric: Normal mood and affect.  Laboratory Data: Lab Results  Component Value Date   WBC 6.5 05/30/2015   HGB 13.3 05/30/2015   HCT 39.0 05/30/2015   MCV 82.6 05/30/2015   PLT 293 05/30/2015    Lab Results  Component Value Date   CREATININE 0.72 06/15/2015    No results found for: PSA  No results found for: TESTOSTERONE  No results found for: HGBA1C  Urinalysis    Component Value Date/Time   COLORURINE YELLOW 05/30/2015 0059   APPEARANCEUR CLEAR 05/30/2015 0059   LABSPEC 1.005 05/30/2015 0059   PHURINE 7.0 05/30/2015 0059   GLUCOSEU Negative 06/07/2015 0913   HGBUR MODERATE* 05/30/2015 0059   BILIRUBINUR Negative 06/07/2015 0913   BILIRUBINUR NEGATIVE 05/30/2015 0059   KETONESUR NEGATIVE 05/30/2015 0059   PROTEINUR NEGATIVE 05/30/2015 0059   UROBILINOGEN 0.2 05/30/2015 0059   NITRITE Negative 06/07/2015 0913   NITRITE NEGATIVE 05/30/2015 0059   LEUKOCYTESUR Negative 06/07/2015 0913   LEUKOCYTESUR NEGATIVE 05/30/2015 0059    Pertinent Imaging:  Study Result     CLINICAL DATA: Microscopic hematuria. Right-sided low back pain for over 1 year.  EXAM: CT ABDOMEN AND PELVIS WITHOUT AND WITH CONTRAST  TECHNIQUE: Multidetector CT imaging of the abdomen and pelvis was performed following the standard protocol before and following the  bolus administration of intravenous contrast.  CONTRAST: 117mL OMNIPAQUE IOHEXOL 300 MG/ML SOLN  COMPARISON: None.  FINDINGS: Lower chest: Poorly defined sub-solid nodular opacity in the posterior left lower lobe measures 12 mm on image 6. This could be inflammatory or neoplastic etiology.  Hepatobiliary: No masses or other significant abnormality identified.  Pancreas: No evidence of mass, inflammatory changes, or other significant abnormality.  Spleen: Within normal limits in size and appearance.  Adrenal Glands: No masses identified.  Kidneys/Urinary Tract: No evidence of urolithiasis or hydronephrosis. No solid or complex cystic renal masses identified. No masses or calculi seen involving the lower urinary tract.  Stomach/Bowel/Peritoneum: No evidence of wall thickening, mass, or obstruction.  Vascular/Lymphatic: No pathologically enlarged lymph nodes identified. No abdominal aortic aneurysm or other significant retroperitoneal abnormality noted.  Reproductive: Prior hysterectomy noted. Adnexal regions are unremarkable in appearance.  Other: None.  Musculoskeletal: No suspicious bone lesions identified.  IMPRESSION: No radiographic evidence of urinary tract carcinoma, urolithiasis, or hydronephrosis.     Cystoscopy Procedure Note  Patient  identification was confirmed, informed consent was obtained, and patient was prepped using Betadine solution.  Lidocaine jelly was administered per urethral meatus.    Preoperative abx where received prior to procedure.    Procedure: - Flexible cystoscope introduced, without any difficulty.   - Thorough search of the bladder revealed:    normal urethral meatus    normal urothelium    no stones    no ulcers     no tumors    no urethral polyps    no trabeculation  - Ureteral orifices were normal in position and appearance.  Post-Procedure: - Patient tolerated the procedure well   Assessment  & Plan:  1. Microscopic hematuria The patient has a negative hematuria workup. We will see her back in one year's time for repeat urinalysis.  2. Lung nodule The radiologist recommends repeat imaging in 3 months. I discussed this with the patient. I also informed the patient's primary care provider via Epic routing so this can be followed during her usual primary care workup. She is agreeable with following up with her primary care provider for monitoring of this lesion.   Return in about 1 year (around 06/28/2016) for with repeat urinalysis.  Nickie Retort, MD  [facility] [address], [place], [place] [address] [phone]

## 2015-07-17 ENCOUNTER — Other Ambulatory Visit: Payer: Self-pay | Admitting: Family Medicine

## 2015-07-18 ENCOUNTER — Other Ambulatory Visit: Payer: Self-pay | Admitting: Family Medicine

## 2015-07-18 DIAGNOSIS — Z01419 Encounter for gynecological examination (general) (routine) without abnormal findings: Secondary | ICD-10-CM

## 2015-07-18 DIAGNOSIS — I10 Essential (primary) hypertension: Secondary | ICD-10-CM

## 2015-07-18 DIAGNOSIS — E785 Hyperlipidemia, unspecified: Secondary | ICD-10-CM

## 2015-07-18 NOTE — Telephone Encounter (Signed)
Is pt needing to continue K+? 

## 2015-07-19 ENCOUNTER — Other Ambulatory Visit (INDEPENDENT_AMBULATORY_CARE_PROVIDER_SITE_OTHER): Payer: Commercial Managed Care - HMO

## 2015-07-19 DIAGNOSIS — Z0000 Encounter for general adult medical examination without abnormal findings: Secondary | ICD-10-CM | POA: Diagnosis not present

## 2015-07-19 DIAGNOSIS — Z01419 Encounter for gynecological examination (general) (routine) without abnormal findings: Secondary | ICD-10-CM

## 2015-07-19 DIAGNOSIS — E785 Hyperlipidemia, unspecified: Secondary | ICD-10-CM | POA: Diagnosis not present

## 2015-07-19 LAB — CBC WITH DIFFERENTIAL/PLATELET
Basophils Absolute: 0 10*3/uL (ref 0.0–0.1)
Basophils Relative: 0.2 % (ref 0.0–3.0)
Eosinophils Absolute: 0.4 10*3/uL (ref 0.0–0.7)
Eosinophils Relative: 6.2 % — ABNORMAL HIGH (ref 0.0–5.0)
HCT: 39.3 % (ref 36.0–46.0)
Hemoglobin: 12.9 g/dL (ref 12.0–15.0)
Lymphocytes Relative: 41.6 % (ref 12.0–46.0)
Lymphs Abs: 2.5 10*3/uL (ref 0.7–4.0)
MCHC: 32.7 g/dL (ref 30.0–36.0)
MCV: 86.4 fl (ref 78.0–100.0)
Monocytes Absolute: 0.3 10*3/uL (ref 0.1–1.0)
Monocytes Relative: 5.7 % (ref 3.0–12.0)
Neutro Abs: 2.8 10*3/uL (ref 1.4–7.7)
Neutrophils Relative %: 46.3 % (ref 43.0–77.0)
Platelets: 295 10*3/uL (ref 150.0–400.0)
RBC: 4.55 Mil/uL (ref 3.87–5.11)
RDW: 12.9 % (ref 11.5–15.5)
WBC: 6 10*3/uL (ref 4.0–10.5)

## 2015-07-19 LAB — COMPREHENSIVE METABOLIC PANEL
ALT: 16 U/L (ref 0–35)
AST: 17 U/L (ref 0–37)
Albumin: 4.2 g/dL (ref 3.5–5.2)
Alkaline Phosphatase: 77 U/L (ref 39–117)
BUN: 13 mg/dL (ref 6–23)
CO2: 33 mEq/L — ABNORMAL HIGH (ref 19–32)
Calcium: 9.8 mg/dL (ref 8.4–10.5)
Chloride: 102 mEq/L (ref 96–112)
Creatinine, Ser: 0.73 mg/dL (ref 0.40–1.20)
GFR: 83.9 mL/min (ref >60.00)
Glucose, Bld: 91 mg/dL (ref 70–99)
Potassium: 3.4 mEq/L — ABNORMAL LOW (ref 3.5–5.1)
Sodium: 140 mEq/L (ref 135–145)
Total Bilirubin: 0.5 mg/dL (ref 0.2–1.2)
Total Protein: 7.6 g/dL (ref 6.0–8.3)

## 2015-07-19 LAB — LIPID PANEL
Cholesterol: 183 mg/dL (ref 0–200)
HDL: 44 mg/dL (ref >39.00)
LDL Cholesterol: 108 mg/dL — ABNORMAL HIGH (ref 0–99)
NonHDL: 138.79
Total CHOL/HDL Ratio: 4
Triglycerides: 156 mg/dL — ABNORMAL HIGH (ref 0.0–149.0)
VLDL: 31.2 mg/dL (ref 0.0–40.0)

## 2015-07-19 LAB — TSH: TSH: 1.77 u[IU]/mL (ref 0.35–4.50)

## 2015-07-19 LAB — HEPATITIS C ANTIBODY: HCV Ab: NEGATIVE

## 2015-07-20 ENCOUNTER — Other Ambulatory Visit: Payer: Self-pay | Admitting: Family Medicine

## 2015-07-20 LAB — HIV ANTIBODY (ROUTINE TESTING W REFLEX): HIV 1&2 Ab, 4th Generation: NONREACTIVE

## 2015-08-15 ENCOUNTER — Other Ambulatory Visit: Payer: Self-pay | Admitting: Family Medicine

## 2015-08-31 ENCOUNTER — Encounter: Payer: Self-pay | Admitting: Family Medicine

## 2015-08-31 ENCOUNTER — Ambulatory Visit (INDEPENDENT_AMBULATORY_CARE_PROVIDER_SITE_OTHER): Payer: Commercial Managed Care - HMO | Admitting: Family Medicine

## 2015-08-31 VITALS — BP 128/64 | HR 83 | Temp 97.7°F | Wt 130.5 lb

## 2015-08-31 DIAGNOSIS — E785 Hyperlipidemia, unspecified: Secondary | ICD-10-CM

## 2015-08-31 DIAGNOSIS — E876 Hypokalemia: Secondary | ICD-10-CM

## 2015-08-31 DIAGNOSIS — R911 Solitary pulmonary nodule: Secondary | ICD-10-CM

## 2015-08-31 DIAGNOSIS — I10 Essential (primary) hypertension: Secondary | ICD-10-CM

## 2015-08-31 LAB — BASIC METABOLIC PANEL
BUN: 14 mg/dL (ref 6–23)
CO2: 31 mEq/L (ref 19–32)
Calcium: 9.6 mg/dL (ref 8.4–10.5)
Chloride: 99 mEq/L (ref 96–112)
Creatinine, Ser: 0.72 mg/dL (ref 0.40–1.20)
GFR: 85.22 mL/min (ref >60.00)
Glucose, Bld: 95 mg/dL (ref 70–99)
Potassium: 3.1 mEq/L — ABNORMAL LOW (ref 3.5–5.1)
Sodium: 138 mEq/L (ref 135–145)

## 2015-08-31 NOTE — Assessment & Plan Note (Signed)
Well controlled on current rx. No changes made today. 

## 2015-08-31 NOTE — Progress Notes (Signed)
Subjective:   Patient ID: Robin Peck, female    DOB: 01/13/1946, 69 y.o.   MRN: [id number]  Robin Peck is a pleasant 69 y.o. year old female who presents to clinic today with Follow-up and Medication Refill  on 08/31/2015  HPI:  Lung nodule- seen on CT as incidental finding in ER in 06/2015. Radiologist suggested repeat CT in 3 months as follow up.  HTN-   Taking HCTZ 25 mg and Norvasc 5 mg daily for over a year. Also takes Klor Con 40 meq. Denies any CP, SOB, LE edema, blurred vision.   Does have h/o hypokalemia. Lab Results  Component Value Date   K 3.4* 07/19/2015   HLD- takes lipitor 10 mg daily. Lab Results  Component Value Date   CHOL 183 07/19/2015   HDL 44.00 07/19/2015   LDLCALC 108* 07/19/2015   LDLDIRECT 113.0 01/24/2015   TRIG 156.0* 07/19/2015   CHOLHDL 4 07/19/2015   Lab Results  Component Value Date   ALT 16 07/19/2015   AST 17 07/19/2015   ALKPHOS 77 07/19/2015   BILITOT 0.5 07/19/2015   Current Outpatient Prescriptions on File Prior to Visit  Medication Sig Dispense Refill  . ALPRAZolam (XANAX) 0.25 MG tablet Take 1 tablet (0.25 mg total) by mouth 2 (two) times daily as needed for anxiety or sleep. 30 tablet 0  . amLODipine (NORVASC) 5 MG tablet Take 5 mg by mouth daily.    . cetirizine (ZYRTEC) 10 MG tablet TAKE 1 TABLET (10 MG TOTAL) BY MOUTH DAILY. 90 tablet 0  . hydrochlorothiazide (HYDRODIURIL) 25 MG tablet TAKE 1 TABLET (25 MG TOTAL) BY MOUTH DAILY. 90 tablet 3  . KLOR-CON M20 20 MEQ tablet TAKE 1 TABLET TWICE A DAY 60 tablet 0  . LIPITOR 10 MG tablet TAKE 1 TABLET (10 MG TOTAL) BY MOUTH DAILY. 90 tablet 3  . [DISCONTINUED] potassium chloride 20 MEQ TBCR Take 10 mEq by mouth 2 (two) times daily. 30 tablet 0   No current facility-administered medications on file prior to visit.    Allergies  Allergen Reactions  . Lisinopril     REACTION: cough    Past Medical History  Diagnosis Date  . Allergic rhinitis   . Hyperlipidemia     . Hypertension     Past Surgical History  Procedure Laterality Date  . Gyn surgery  2002    hysterectomy    Family History  Problem Relation Age of Onset  . Heart attack Brother   . Hypertension Brother   . Hypertension Father   . Hypertension Mother   . Hypertension Sister     Social History   Social History  . Marital Status: Married    Spouse Name: N/A  . Number of Children: 1  . Years of Education: N/A   Occupational History  . Not on file.   Social History Main Topics  . Smoking status: Never Smoker   . Smokeless tobacco: Not on file  . Alcohol Use: No  . Drug Use: No  . Sexual Activity: Not on file   Other Topics Concern  . Not on file   Social History Narrative   The PMH, PSH, Social History, Family History, Medications, and allergies have been reviewed in [facility], and have been updated if relevant.  Review of Systems  Constitutional: Negative.   Respiratory: Negative.   Cardiovascular: Negative.   Gastrointestinal: Negative.   Endocrine: Negative.   Genitourinary: Negative.   Musculoskeletal: Positive for myalgias and neck  pain.  Skin: Negative.   Allergic/Immunologic: Negative.   Neurological: Negative.   Psychiatric/Behavioral: Negative.   All other systems reviewed and are negative.      Objective:    BP 128/64 mmHg  Pulse 83  Temp(Src) 97.7 F (36.5 C) (Oral)  Wt 130 lb 8 oz (59.194 kg)  SpO2 97%   Physical Exam  Constitutional: She appears well-developed and well-nourished. No distress.  HENT:  Head: Normocephalic.  Eyes: Conjunctivae are normal.  Cardiovascular: Normal rate and regular rhythm.   Pulmonary/Chest: Effort normal and breath sounds normal.  Musculoskeletal: Normal range of motion. She exhibits no edema.  Neurological: She is alert. No cranial nerve deficit.  Skin: Skin is warm and dry. She is not diaphoretic.  Psychiatric: She has a normal mood and affect. Her behavior is normal. Judgment and thought content  normal.  Nursing note and vitals reviewed.         Assessment & Plan:   Hypokalemia - Plan: Basic metabolic panel  Essential hypertension  HLD (hyperlipidemia)  Lung nodule seen on imaging study - Plan: CT Chest Wo Contrast No Follow-up on file.

## 2015-08-31 NOTE — Patient Instructions (Signed)
Great to see you. Happy New Year. I will call you with your lab results.  Please stop by to see Rosaria Ferries on your way out.

## 2015-08-31 NOTE — Assessment & Plan Note (Signed)
Continue current dose of supplemental potassium. Recheck BMET today.

## 2015-08-31 NOTE — Assessment & Plan Note (Signed)
Follow up CT ordered- advised pt to see Rosaria Ferries on her way out to schedule this today.

## 2015-09-01 ENCOUNTER — Other Ambulatory Visit: Payer: Self-pay | Admitting: *Deleted

## 2015-09-01 MED ORDER — POTASSIUM CHLORIDE CRYS ER 20 MEQ PO TBCR: 20.0000 meq | EXTENDED_RELEASE_TABLET | Freq: Two times a day (BID) | ORAL | Status: DC

## 2015-09-13 ENCOUNTER — Other Ambulatory Visit: Payer: Self-pay | Admitting: Family Medicine

## 2015-09-13 ENCOUNTER — Ambulatory Visit: Admission: RE | Admit: 2015-09-13 | Payer: Commercial Managed Care - HMO | Source: Ambulatory Visit

## 2015-09-13 ENCOUNTER — Ambulatory Visit
Admission: RE | Admit: 2015-09-13 | Discharge: 2015-09-13 | Disposition: A | Discharge status: Home / Self Care | Payer: Commercial Managed Care - HMO | Source: Ambulatory Visit | Attending: Family Medicine | Admitting: Family Medicine

## 2015-09-13 DIAGNOSIS — R911 Solitary pulmonary nodule: Secondary | ICD-10-CM | POA: Insufficient documentation

## 2015-09-15 ENCOUNTER — Telehealth: Payer: Self-pay | Admitting: Family Medicine

## 2015-09-15 NOTE — Telephone Encounter (Signed)
Pt called. She is concerned about the lung biopsy she was told about and had not heard from anyone to schedule it. I do not see an order or referral to general surgery in system. Please advise.   Pt wants [place].

## 2015-09-15 NOTE — Telephone Encounter (Signed)
Robin Peck is out of the office today and all next week. I must have overlooked the referral. I apologize. I will work on it now. Thanks

## 2015-09-15 NOTE — Telephone Encounter (Signed)
Great. Thank you.

## 2015-09-15 NOTE — Telephone Encounter (Signed)
I placed an order for cardiothoracic surgery on 09/13/15. This would not be for general surgeon.  Rosaria Ferries, what is the status of the referral?

## 2015-09-20 ENCOUNTER — Encounter: Payer: Commercial Managed Care - HMO | Admitting: Cardiothoracic Surgery

## 2015-09-22 ENCOUNTER — Institutional Professional Consult (permissible substitution) (INDEPENDENT_AMBULATORY_CARE_PROVIDER_SITE_OTHER): Payer: Commercial Managed Care - HMO | Admitting: Cardiothoracic Surgery

## 2015-09-22 ENCOUNTER — Encounter: Payer: Self-pay | Admitting: Cardiothoracic Surgery

## 2015-09-22 ENCOUNTER — Other Ambulatory Visit: Payer: Self-pay | Admitting: *Deleted

## 2015-09-22 VITALS — BP 151/75 | HR 88 | Resp 20 | Ht 60.0 in | Wt 130.0 lb

## 2015-09-22 DIAGNOSIS — R911 Solitary pulmonary nodule: Secondary | ICD-10-CM | POA: Diagnosis not present

## 2015-09-22 NOTE — Progress Notes (Signed)
PCP is Arnette Norris, MD Referring Provider is Lucille Passy, MD  Chief Complaint  Patient presents with  . Lung Lesion    Surgical eval, Chest CT 09/13/2015    HPI:70 year old Asian female referred for evaluation of a 1.2 cm round left lower lobe nodular density suspicious for bronchogenic carcinoma. The patient is a nonsmoker.  The patient developed microscopic hematuria. She was referred to urology. CT scan of the abdomen was performed. This apparently showed no renal abnormalities but a lung nodule and left lower lobe was noted. She subsequent had a chest CT scan which showed the left lower lobe nodule. There is no abnormal mediastinal adenopathy or other nodularity. The patient denies any palmar he symptoms. She underwent cystoscopy by urology which was clear.  There is No family history lung cancer.  She denies recurrent pulmonary infections or previous thoracic trauma. No history of cardiac disease. Her CT scan does show some calcification of the aortic arch, descending thoracic aorta and some coronary calcification.    Past Medical History  Diagnosis Date  . Allergic rhinitis   . Hyperlipidemia   . Hypertension     Past Surgical History  Procedure Laterality Date  . Gyn surgery  2002    hysterectomy    Family History  Problem Relation Age of Onset  . Heart attack Brother   . Hypertension Brother   . Hypertension Father   . Hypertension Mother   . Hypertension Sister     Social History Social History  Substance Use Topics  . Smoking status: Never Smoker   . Smokeless tobacco: None  . Alcohol Use: No    Current Outpatient Prescriptions  Medication Sig Dispense Refill  . ALPRAZolam (XANAX) 0.25 MG tablet Take 1 tablet (0.25 mg total) by mouth 2 (two) times daily as needed for anxiety or sleep. 30 tablet 0  . amLODipine (NORVASC) 5 MG tablet Take 5 mg by mouth daily.    . cetirizine (ZYRTEC) 10 MG tablet TAKE 1 TABLET (10 MG TOTAL) BY MOUTH DAILY. 90 tablet 0  .  hydrochlorothiazide (HYDRODIURIL) 25 MG tablet TAKE 1 TABLET (25 MG TOTAL) BY MOUTH DAILY. 90 tablet 3  . LIPITOR 10 MG tablet TAKE 1 TABLET (10 MG TOTAL) BY MOUTH DAILY. 90 tablet 3  . potassium chloride SA (KLOR-CON M20) 20 MEQ tablet Take 1 tablet (20 mEq total) by mouth 2 (two) times daily. 60 tablet 3  . [DISCONTINUED] potassium chloride 20 MEQ TBCR Take 10 mEq by mouth 2 (two) times daily. 30 tablet 0   No current facility-administered medications for this visit.    Allergies  Allergen Reactions  . Lisinopril     REACTION: cough    Review of Systems         Review of Systems :  [ y ] = yes, [  ] = no        General :  Weight gain [   ]    Weight loss  [   ]  Fatigue [  ]  Fever [  ]  Chills  [  ]                                Weakness  [  ]           Cardiac :  Chest pain/ pressure [  ]  Resting SOB [  ] exertional SOB [  ] positive history of  hypertension                       Orthopnea [  ]  Pedal edema  [  ]  Palpitations [  ] Syncope/presyncope [ ]                         Paroxysmal nocturnal dyspnea [  ]        Pulmonary : cough [  ]  wheezing [  ]  Hemoptysis [  ] Sputum [  ] Snoring [  ]                              Pneumothorax [  ]  Sleep apnea [  ]       GI : Vomiting [  ]  Dysphagia [  ]  Melena  [  ]  Abdominal pain [  ] BRBPR [  ]              Heart burn [  ]  Constipation [  ] Diarrhea  [  ] Colonoscopy [  ]       GU : Hematuria [ yes-evaluated by urology ]  Dysuria [  ]  Nocturia [  ] UTI's [  ]       Vascular : Claudication [  ]  Rest pain [  ]  DVT [  ] Vein stripping [  ] leg ulcers [  ]                          TIA [  ] Stroke [  ]  Varicose veins [  ]       NEURO :  Headaches  [  ] Seizures [  ] Vision changes [  ] Paresthesias [  ] right-hand dominant       Musculoskeletal :  Arthritis [yes especially of her neck  ] Gout  [  ]  Back pain [place]  ]  Joint pain [  ]       Skin :  Rash [  ]  Melanoma [  ]        Heme : Bleeding problems [  ]Clotting Disorders [   ] Anemia [  ]Blood Transfusion [ ]        Endocrine : Diabetes [ no ] Thyroid Disorder  [  ]       Psych : Depression [  ]  Anxiety [  ]  Psych hospitalizations [  ]                                               BP 151/75 mmHg  Pulse 88  Resp 20  Ht 5' (1.524 m)  Wt 130 lb (58.968 kg)  BMI 25.39 kg/m2  SpO2 96% Physical Exam      Physical Exam  General: anxious small Asian female no acute distress HEENT: Normocephalic pupils equal , dentition adequate Neck: Supple without JVD, adenopathy, or bruit Chest: Clear to auscultation, symmetrical breath sounds, no rhonchi, no tenderness             or deformity Cardiovascular: Regular rate and rhythm, no murmur, no gallop, peripheral pulses  palpable in all extremities Abdomen:  Soft, nontender, no palpable mass or organomegaly Extremities: Warm, well-perfused, no clubbing cyanosis edema or tenderness,              no venous stasis changes of the legs Rectal/GU: Deferred Neuro: Grossly non--focal and symmetrical throughout Skin: Clean and dry without rash or ulceration  .  Diagnostic Tests: CT scan of the chest performed reviewed and counseled images with patient and husband. She hasn't had risk left lower lobe nodule 1.5 cm. She's not had a PET scan.  Impression: Possible early stage bronchogenic carcinoma left lower lobe with  concern for a adenocarcinoma with her social history and demographics.  Plan: PET scan, PFTs then returned office to discuss possible resection if the nodule is hypermetabolic.  Len Childs, MD [facility] [phone]

## 2015-10-04 ENCOUNTER — Ambulatory Visit (HOSPITAL_COMMUNITY)
Admission: RE | Admit: 2015-10-04 | Discharge: 2015-10-04 | Disposition: A | Discharge status: Home / Self Care | Payer: Commercial Managed Care - HMO | Source: Ambulatory Visit | Attending: Cardiothoracic Surgery | Admitting: Cardiothoracic Surgery

## 2015-10-04 ENCOUNTER — Encounter (HOSPITAL_COMMUNITY)
Admission: RE | Admit: 2015-10-04 | Discharge: 2015-10-04 | Disposition: A | Discharge status: Home / Self Care | Payer: Commercial Managed Care - HMO | Source: Ambulatory Visit | Attending: Cardiothoracic Surgery | Admitting: Cardiothoracic Surgery

## 2015-10-04 DIAGNOSIS — R911 Solitary pulmonary nodule: Secondary | ICD-10-CM | POA: Insufficient documentation

## 2015-10-04 LAB — PULMONARY FUNCTION TEST
DL/VA % pred: 92 %
DL/VA: 3.91 ml/min/mmHg/L
DLCO unc % pred: 59 %
DLCO unc: 11.24 ml/min/mmHg
FEF 25-75 Post: 2.37 L/sec
FEF 25-75 Pre: 2.37 L/sec
FEF2575-%Change-Post: 0 %
FEV1-%Change-Post: 1 %
FEV1-Post: 1.65 L
FEV1-Pre: 1.64 L
FEV1FVC-%Change-Post: 0 %
FEV6-%Change-Post: 0 %
FEV6-Post: 1.91 L
FEV6-Pre: 1.9 L
FVC-%Change-Post: 0 %
FVC-Post: 1.91 L
FVC-Pre: 1.9 L
Post FEV1/FVC ratio: 86 %
Post FEV6/FVC ratio: 100 %
Pre FEV1/FVC ratio: 86 %
Pre FEV6/FVC Ratio: 100 %
RV % pred: 78 %
RV: 1.56 L
TLC % pred: 79 %
TLC: 3.53 L

## 2015-10-04 LAB — GLUCOSE, CAPILLARY: Glucose-Capillary: 108 mg/dL — ABNORMAL HIGH (ref 65–99)

## 2015-10-04 MED ORDER — FLUDEOXYGLUCOSE F - 18 (FDG) INJECTION
6.3000 | Freq: Once | INTRAVENOUS | Status: AC | PRN
  Administered 2015-10-04: 6.3 via INTRAVENOUS

## 2015-10-04 MED ORDER — ALBUTEROL SULFATE (2.5 MG/3ML) 0.083% IN NEBU
2.5000 mg | INHALATION_SOLUTION | Freq: Once | RESPIRATORY_TRACT | Status: AC
Start: 2015-10-04 — End: 2015-10-04
  Administered 2015-10-04: 2.5 mg via RESPIRATORY_TRACT

## 2015-10-11 ENCOUNTER — Encounter: Payer: Self-pay | Admitting: Cardiothoracic Surgery

## 2015-10-11 ENCOUNTER — Ambulatory Visit (INDEPENDENT_AMBULATORY_CARE_PROVIDER_SITE_OTHER): Payer: Commercial Managed Care - HMO | Admitting: Cardiothoracic Surgery

## 2015-10-11 VITALS — BP 139/68 | HR 79 | Resp 16 | Ht 60.0 in | Wt 130.0 lb

## 2015-10-11 DIAGNOSIS — D381 Neoplasm of uncertain behavior of trachea, bronchus and lung: Secondary | ICD-10-CM | POA: Diagnosis not present

## 2015-10-11 NOTE — Progress Notes (Signed)
PCP is Arnette Norris, MD Referring Provider is Lucille Passy, MD  Chief Complaint  Patient presents with  . Follow-up    after PET and PFT...10/04/15    HPI:the patient returns for further discussion of a recently diagnosed 1.2 cm solid left lower lobe peripheral nodule. This was an incidental finding on CT of the abdomen to evaluate  Microscopic hematuria.the patient is a nonsmoker Asian female without family history lung cancer. The patient has no pulmonary symptoms. The patient returns today after having completed a PET scan and pulmonary function tests.  PET scan is negative. The left lower lobe sub solid density has no abnormal hypermetabolic activity.no abnormal nodes.  PFTs show diffusion capacity of 59%, FEV1 1.6 FVC 1.9.   Past Medical History  Diagnosis Date  . Allergic rhinitis   . Hyperlipidemia   . Hypertension     Past Surgical History  Procedure Laterality Date  . Gyn surgery  2002    hysterectomy    Family History  Problem Relation Age of Onset  . Heart attack Brother   . Hypertension Brother   . Hypertension Father   . Hypertension Mother   . Hypertension Sister     Social History Social History  Substance Use Topics  . Smoking status: Never Smoker   . Smokeless tobacco: None  . Alcohol Use: No    Current Outpatient Prescriptions  Medication Sig Dispense Refill  . ALPRAZolam (XANAX) 0.25 MG tablet Take 1 tablet (0.25 mg total) by mouth 2 (two) times daily as needed for anxiety or sleep. 30 tablet 0  . amLODipine (NORVASC) 5 MG tablet Take 5 mg by mouth daily.    . cetirizine (ZYRTEC) 10 MG tablet TAKE 1 TABLET (10 MG TOTAL) BY MOUTH DAILY. 90 tablet 0  . hydrochlorothiazide (HYDRODIURIL) 25 MG tablet TAKE 1 TABLET (25 MG TOTAL) BY MOUTH DAILY. 90 tablet 3  . LIPITOR 10 MG tablet TAKE 1 TABLET (10 MG TOTAL) BY MOUTH DAILY. 90 tablet 3  . potassium chloride SA (KLOR-CON M20) 20 MEQ tablet Take 1 tablet (20 mEq total) by mouth 2 (two) times daily. 60  tablet 3  . [DISCONTINUED] potassium chloride 20 MEQ TBCR Take 10 mEq by mouth 2 (two) times daily. 30 tablet 0   No current facility-administered medications for this visit.    Allergies  Allergen Reactions  . Lisinopril     REACTION: cough    Review of Systems         Review of Systems :  [ y ] = yes, [  ] = no        General :  Weight gain [   ]    Weight loss  [   ]  Fatigue [  ]  Fever [  ]  Chills  [  ]                                Weakness  [  ]           Cardiac :  Chest pain/ pressure [  ]  Resting SOB [  ] exertional SOB [  ]                        Orthopnea [  ]  Pedal edema  [  ]  Palpitations [in- frequent  ] Syncope/presyncope [ ]   Paroxysmal nocturnal dyspnea [  ]        Pulmonary : cough [  ]  wheezing [  ]  Hemoptysis [  ] Sputum [  ] Snoring [  ]                              Pneumothorax [  ]  Sleep apnea [  ]       GI : Vomiting [  ]  Dysphagia [  ]  Melena  [  ]  Abdominal pain [  ] BRBPR [  ]              Heart burn [  ]  Constipation [  ] Diarrhea  [  ] Colonoscopy [  ]       GU : Hematuria [  ]  Dysuria [  ]  Nocturia [  ] UTI's [  ]       Vascular : Claudication [  ]  Rest pain [  ]  DVT [  ] Vein stripping [  ] leg ulcers [  ]                          TIA [  ] Stroke [  ]  Varicose veins [  ]       NEURO :  Headaches  [  ] Seizures [  ] Vision changes [  ] Paresthesias [  ]       Musculoskeletal :  Arthritis [  ] Gout  [  ]  Back pain [  ]  Joint pain [  ]       Skin :  Rash [  ]  Melanoma [  ]        Heme : Bleeding problems [  ]Clotting Disorders [  ] Anemia [  ]Blood Transfusion [ ]        Endocrine : Diabetes [  ] Thyroid Disorder  [  ]       Psych : Depression [  ]  Anxiety [  ]  Psych hospitalizations [  ]                                               BP 139/68 mmHg  Pulse 79  Resp 16  Ht 5' (1.524 m)  Wt 130 lb (58.968 kg)  BMI 25.39 kg/m2  SpO2 95% Physical Exam      Physical Exam  General: well appearing  middle-aged Asian female no acute distress HEENT: Normocephalic pupils equal , dentition adequate Neck: Supple without JVD, adenopathy, or bruit Chest: Clear to auscultation, symmetrical breath sounds, no rhonchi, no tenderness             or deformity Cardiovascular: Regular rate and rhythm, no murmur, no gallop, peripheral pulses             palpable in all extremities Abdomen:  Soft, nontender, no palpable mass or organomegaly Extremities: Warm, well-perfused, no clubbing cyanosis edema or tenderness,              no venous stasis changes of the legs Rectal/GU: Deferred Neuro: Grossly non--focal and symmetrical throughout Skin: Clean and dry without rash or ulceration   Diagnostic Tests: Results of  PET scan and pulmonary function tests reviewed with patient and husband. With 1.2 cm nodule PET negative nodule , which actually looks somewhat smaller than on the dedicated CT scan last month, I would not recommend resection. I believe that a needle biopsy would be difficult based on the size and the results would be potentially misleading. I recommended that we repeat the CT scan of the chest 6 months after the original scan to follow size of the nodule.I offered the patient the opportunity for an  interventional radiology CT-guided biopsy this time but she prefers to wait for a followup scan.I told the patient I felt that there is at least a  15 % chance the nodule could be a slow-growing cancer.  Impression: Left lower lobe 1.2 cm  Nodule negative PET scan activity  Plan:we'll repeat CT scan of chest in 6 months-this summer. Plan discussed in detail the patient and she understands and agrees.   Len Childs, MD [facility] [phone]

## 2015-10-13 ENCOUNTER — Other Ambulatory Visit: Payer: Self-pay | Admitting: Family Medicine

## 2016-01-11 ENCOUNTER — Ambulatory Visit: Payer: Commercial Managed Care - HMO | Admitting: Internal Medicine

## 2016-01-11 ENCOUNTER — Ambulatory Visit (INDEPENDENT_AMBULATORY_CARE_PROVIDER_SITE_OTHER): Payer: Commercial Managed Care - HMO | Admitting: Internal Medicine

## 2016-01-11 ENCOUNTER — Encounter: Payer: Self-pay | Admitting: Internal Medicine

## 2016-01-11 VITALS — BP 126/70 | HR 88 | Temp 97.6°F | Resp 12 | Ht 60.0 in | Wt 129.0 lb

## 2016-01-11 DIAGNOSIS — N3090 Cystitis, unspecified without hematuria: Secondary | ICD-10-CM | POA: Diagnosis not present

## 2016-01-11 DIAGNOSIS — R300 Dysuria: Secondary | ICD-10-CM

## 2016-01-11 LAB — POCT URINALYSIS DIPSTICK
Bilirubin, UA: NEGATIVE
Glucose, UA: NEGATIVE
Ketones, UA: NEGATIVE
Nitrite, UA: NEGATIVE
Spec Grav, UA: 1.02
Urobilinogen, UA: 0.2
pH, UA: 6

## 2016-01-11 LAB — URINALYSIS, ROUTINE W REFLEX MICROSCOPIC
Bilirubin Urine: NEGATIVE
Ketones, ur: NEGATIVE
Nitrite: NEGATIVE
Specific Gravity, Urine: 1.01 (ref 1.000–1.030)
Total Protein, Urine: 30 — AB
Urine Glucose: NEGATIVE
Urobilinogen, UA: 0.2 (ref 0.0–1.0)
pH: 7 (ref 5.0–8.0)

## 2016-01-11 MED ORDER — CIPROFLOXACIN HCL 250 MG PO TABS: 250.0000 mg | ORAL_TABLET | Freq: Two times a day (BID) | ORAL | Status: DC

## 2016-01-11 NOTE — Patient Instructions (Signed)
You have a urinary tract infection.  Please start the cipro right away  Stay on the Activia to prevent diarrhea  We will run a culture to be sure the cipro is the right choice   Urinary Tract Infection Urinary tract infections (UTIs) can develop anywhere along your urinary tract. Your urinary tract is your body's drainage system for removing wastes and extra water. Your urinary tract includes two kidneys, two ureters, a bladder, and a urethra. Your kidneys are a pair of bean-shaped organs. Each kidney is about the size of your fist. They are located below your ribs, one on each side of your spine. CAUSES Infections are caused by microbes, which are microscopic organisms, including fungi, viruses, and bacteria. These organisms are so small that they can only be seen through a microscope. Bacteria are the microbes that most commonly cause UTIs. SYMPTOMS  Symptoms of UTIs may vary by age and gender of the patient and by the location of the infection. Symptoms in young women typically include a frequent and intense urge to urinate and a painful, burning feeling in the bladder or urethra during urination. Older women and men are more likely to be tired, shaky, and weak and have muscle aches and abdominal pain. A fever may mean the infection is in your kidneys. Other symptoms of a kidney infection include pain in your back or sides below the ribs, nausea, and vomiting. DIAGNOSIS To diagnose a UTI, your caregiver will ask you about your symptoms. Your caregiver will also ask you to provide a urine sample. The urine sample will be tested for bacteria and white blood cells. White blood cells are made by your body to help fight infection. TREATMENT  Typically, UTIs can be treated with medication. Because most UTIs are caused by a bacterial infection, they usually can be treated with the use of antibiotics. The choice of antibiotic and length of treatment depend on your symptoms and the type of bacteria causing  your infection. HOME CARE INSTRUCTIONS  If you were prescribed antibiotics, take them exactly as your caregiver instructs you. Finish the medication even if you feel better after you have only taken some of the medication.  Drink enough water and fluids to keep your urine clear or pale yellow.  Avoid caffeine, tea, and carbonated beverages. They tend to irritate your bladder.  Empty your bladder often. Avoid holding urine for long periods of time.  Empty your bladder before and after sexual intercourse.  After a bowel movement, women should cleanse from front to back. Use each tissue only once. SEEK MEDICAL CARE IF:   You have back pain.  You develop a fever.  Your symptoms do not begin to resolve within 3 days. SEEK IMMEDIATE MEDICAL CARE IF:   You have severe back pain or lower abdominal pain.  You develop chills.  You have nausea or vomiting.  You have continued burning or discomfort with urination. MAKE SURE YOU:   Understand these instructions.  Will watch your condition.  Will get help right away if you are not doing well or get worse.   This information is not intended to replace advice given to you by your health care provider. Make sure you discuss any questions you have with your health care provider.   Document Released: 05/29/2005 Document Revised: 05/10/2015 Document Reviewed: 09/27/2011 Elsevier Interactive Patient Education [employer].

## 2016-01-11 NOTE — Progress Notes (Signed)
Pre-visit discussion using our clinic review tool. No additional management support is needed unless otherwise documented below in the visit note.  

## 2016-01-11 NOTE — Progress Notes (Signed)
Subjective:  Patient ID: Robin Peck, female    DOB: November 03, 1945  Age: 70 y.o. MRN: [id number]  CC: The primary encounter diagnosis was Cystitis. A diagnosis of Dysuria was also pertinent to this visit.  HPI Robin Peck presents for dysuria and urinary hesitancy  .started yesterday .  Last one 2 years ago while on vacation at the beach . Denies hematuria, back pain , nausea and fevers.   Outpatient Prescriptions Prior to Visit  Medication Sig Dispense Refill  . ALPRAZolam (XANAX) 0.25 MG tablet Take 1 tablet (0.25 mg total) by mouth 2 (two) times daily as needed for anxiety or sleep. 30 tablet 0  . cetirizine (ZYRTEC) 10 MG tablet TAKE 1 TABLET (10 MG TOTAL) BY MOUTH DAILY. 90 tablet 0  . hydrochlorothiazide (HYDRODIURIL) 25 MG tablet TAKE 1 TABLET (25 MG TOTAL) BY MOUTH DAILY. 90 tablet 2  . LIPITOR 10 MG tablet TAKE 1 TABLET (10 MG TOTAL) BY MOUTH DAILY. 90 tablet 2  . potassium chloride SA (KLOR-CON M20) 20 MEQ tablet Take 1 tablet (20 mEq total) by mouth 2 (two) times daily. 60 tablet 3  . amLODipine (NORVASC) 5 MG tablet Take 5 mg by mouth daily.    . NORVASC 5 MG tablet TAKE 1 TO 2 TABLETS EVERY DAY 90 tablet 2   No facility-administered medications prior to visit.    Review of Systems;  Patient denies headache, fevers, malaise, unintentional weight loss, skin rash, eye pain, sinus congestion and sinus pain, sore throat, dysphagia,  hemoptysis , cough, dyspnea, wheezing, chest pain, palpitations, orthopnea, edema, abdominal pain, nausea, melena, diarrhea, constipation, flank pain, dysuria, hematuria, urinary  Frequency, nocturia, numbness, tingling, seizures,  Focal weakness, Loss of consciousness,  Tremor, insomnia, depression, anxiety, and suicidal ideation.      Objective:  BP 126/70 mmHg  Pulse 88  Temp(Src) 97.6 F (36.4 C) (Oral)  Resp 12  Ht 5' (1.524 m)  Wt 129 lb (58.514 kg)  BMI 25.19 kg/m2  SpO2 99%  BP Readings from Last 3 Encounters:  01/11/16 126/70    10/11/15 139/68  09/22/15 151/75    Wt Readings from Last 3 Encounters:  01/11/16 129 lb (58.514 kg)  10/11/15 130 lb (58.968 kg)  09/22/15 130 lb (58.968 kg)    General appearance: alert, cooperative and appears stated age Ears: normal TM's and external ear canals both ears Throat: lips, mucosa, and tongue normal; teeth and gums normal Neck: no adenopathy, no carotid bruit, supple, symmetrical, trachea midline and thyroid not enlarged, symmetric, no tenderness/mass/nodules Back: symmetric, no curvature. ROM normal. No CVA tenderness. Lungs: clear to auscultation bilaterally Heart: regular rate and rhythm, S1, S2 normal, no murmur, click, rub or gallop Abdomen: soft, non-tender; bowel sounds normal; no masses,  no organomegaly Pulses: 2+ and symmetric Skin: Skin color, texture, turgor normal. No rashes or lesions Lymph nodes: Cervical, supraclavicular, and axillary nodes normal.  No results found for: HGBA1C  Lab Results  Component Value Date   CREATININE 0.72 08/31/2015   CREATININE 0.73 07/19/2015   CREATININE 0.72 06/15/2015    Lab Results  Component Value Date   WBC 6.0 07/19/2015   HGB 12.9 07/19/2015   HCT 39.3 07/19/2015   PLT 295.0 07/19/2015   GLUCOSE 95 08/31/2015   CHOL 183 07/19/2015   TRIG 156.0* 07/19/2015   HDL 44.00 07/19/2015   LDLDIRECT 113.0 01/24/2015   LDLCALC 108* 07/19/2015   ALT 16 07/19/2015   AST 17 07/19/2015   NA 138 08/31/2015  K 3.1* 08/31/2015   CL 99 08/31/2015   CREATININE 0.72 08/31/2015   BUN 14 08/31/2015   CO2 31 08/31/2015   TSH 1.77 07/19/2015    Nm Pet Image Initial (pi) Skull Base To Thigh  10/04/2015  CLINICAL DATA:  Initial treatment strategy for left lower lobe pulmonary nodule. EXAM: NUCLEAR MEDICINE PET SKULL BASE TO THIGH TECHNIQUE: 6.3 mCi F-18 FDG was injected intravenously. Full-ring PET imaging was performed from the skull base to thigh after the radiotracer. CT data was obtained and used for attenuation  correction and anatomic localization. FASTING BLOOD GLUCOSE:  Value: 108 mg/dl COMPARISON:  Chest CT on 09/13/2015 and abdomen CT on 06/13/2015 FINDINGS: NECK No hypermetabolic lymph nodes in the neck. Mild hypermetabolic brown fat noted within the neck. CHEST Hypermetabolic brown fat also noted within the upper thorax. No hypermetabolic mediastinal or hilar nodes. 12 mm sub-solid pulmonary nodule in the posterior left lower lobe on image 45/ series 6 shows no associated metabolic activity. ABDOMEN/PELVIS No abnormal hypermetabolic activity within the liver, pancreas, adrenal glands, or spleen. No hypermetabolic lymph nodes in the abdomen or pelvis. SKELETON No focal hypermetabolic activity to suggest skeletal metastasis. IMPRESSION: 12 mm sub-solid pulmonary nodule in posterior left lower lobe shows no associated metabolic activity. Low-grade adenocarcinoma cannot be excluded. Consider percutaneous needle biopsy versus continued followup by CT in 6 months. These recommendations are taken from: Recommendations for the Management of Subsolid Pulmonary Nodules Detected at CT: A Statement from the [facility] 2013; 266:1, [phone]. Electronically Signed   By: Earle Gell M.D.   On: 10/04/2015 14:43    Assessment & Plan:   Problem List Items Addressed This Visit    Cystitis - Primary    Empiric treatment given. With ciprofloxacin.  HOwever, her culture grew no dominant species.  If symptoms persist, repeat culture needed        Other Visit Diagnoses    Dysuria        Relevant Orders    POCT Urinalysis Dipstick (Completed)    Urinalysis, Routine w reflex microscopic (Completed)    Urine Culture (Completed)       I am having Ms. Bonnet start on ciprofloxacin. I am also having her maintain her cetirizine, ALPRAZolam, potassium chloride SA, LIPITOR, and hydrochlorothiazide.  Meds ordered this encounter  Medications  . ciprofloxacin (CIPRO) 250 MG tablet    Sig: Take 1 tablet (250 mg  total) by mouth 2 (two) times daily.    Dispense:  10 tablet    Refill:  0    There are no discontinued medications.  Follow-up: No Follow-up on file.   Crecencio Mc, MD

## 2016-01-12 ENCOUNTER — Other Ambulatory Visit: Payer: Self-pay | Admitting: *Deleted

## 2016-01-12 ENCOUNTER — Other Ambulatory Visit: Payer: Self-pay | Admitting: Family Medicine

## 2016-01-12 LAB — URINE CULTURE: Colony Count: 25000

## 2016-01-12 MED ORDER — NORVASC 5 MG PO TABS: ORAL_TABLET | ORAL | Status: DC

## 2016-01-13 ENCOUNTER — Encounter: Payer: Self-pay | Admitting: Internal Medicine

## 2016-01-13 DIAGNOSIS — N3090 Cystitis, unspecified without hematuria: Secondary | ICD-10-CM | POA: Insufficient documentation

## 2016-01-13 NOTE — Assessment & Plan Note (Signed)
Empiric treatment given. With ciprofloxacin.  HOwever, her culture grew no dominant species.  If symptoms persist, repeat culture needed

## 2016-01-22 ENCOUNTER — Other Ambulatory Visit: Payer: Self-pay | Admitting: Family Medicine

## 2016-01-23 ENCOUNTER — Encounter: Payer: Self-pay | Admitting: Family Medicine

## 2016-01-23 ENCOUNTER — Ambulatory Visit (INDEPENDENT_AMBULATORY_CARE_PROVIDER_SITE_OTHER): Payer: Commercial Managed Care - HMO | Admitting: Family Medicine

## 2016-01-23 ENCOUNTER — Other Ambulatory Visit: Payer: Self-pay | Admitting: *Deleted

## 2016-01-23 VITALS — BP 148/76 | HR 83 | Temp 97.7°F | Wt 130.0 lb

## 2016-01-23 DIAGNOSIS — I10 Essential (primary) hypertension: Secondary | ICD-10-CM

## 2016-01-23 DIAGNOSIS — N3090 Cystitis, unspecified without hematuria: Secondary | ICD-10-CM | POA: Insufficient documentation

## 2016-01-23 DIAGNOSIS — Z23 Encounter for immunization: Secondary | ICD-10-CM | POA: Diagnosis not present

## 2016-01-23 DIAGNOSIS — E876 Hypokalemia: Secondary | ICD-10-CM | POA: Diagnosis not present

## 2016-01-23 DIAGNOSIS — R918 Other nonspecific abnormal finding of lung field: Secondary | ICD-10-CM

## 2016-01-23 DIAGNOSIS — E785 Hyperlipidemia, unspecified: Secondary | ICD-10-CM | POA: Diagnosis not present

## 2016-01-23 DIAGNOSIS — R911 Solitary pulmonary nodule: Secondary | ICD-10-CM

## 2016-01-23 LAB — LIPID PANEL
Cholesterol: 187 mg/dL (ref 0–200)
HDL: 39.5 mg/dL (ref >39.00)
LDL Cholesterol: 111 mg/dL — ABNORMAL HIGH (ref 0–99)
NonHDL: 147.32
Total CHOL/HDL Ratio: 5
Triglycerides: 180 mg/dL — ABNORMAL HIGH (ref 0.0–149.0)
VLDL: 36 mg/dL (ref 0.0–40.0)

## 2016-01-23 LAB — COMPREHENSIVE METABOLIC PANEL
ALT: 14 U/L (ref 0–35)
AST: 17 U/L (ref 0–37)
Albumin: 4.4 g/dL (ref 3.5–5.2)
Alkaline Phosphatase: 81 U/L (ref 39–117)
BUN: 16 mg/dL (ref 6–23)
CO2: 31 mEq/L (ref 19–32)
Calcium: 10 mg/dL (ref 8.4–10.5)
Chloride: 102 mEq/L (ref 96–112)
Creatinine, Ser: 0.73 mg/dL (ref 0.40–1.20)
GFR: 83.78 mL/min (ref >60.00)
Glucose, Bld: 95 mg/dL (ref 70–99)
Potassium: 3.8 mEq/L (ref 3.5–5.1)
Sodium: 140 mEq/L (ref 135–145)
Total Bilirubin: 0.5 mg/dL (ref 0.2–1.2)
Total Protein: 7.7 g/dL (ref 6.0–8.3)

## 2016-01-23 MED ORDER — NORVASC 5 MG PO TABS: ORAL_TABLET | ORAL | Status: DC

## 2016-01-23 MED ORDER — ALPRAZOLAM 0.25 MG PO TABS: 0.2500 mg | ORAL_TABLET | Freq: Two times a day (BID) | ORAL | Status: DC | PRN

## 2016-01-23 MED ORDER — CYCLOBENZAPRINE HCL 5 MG PO TABS: 5.0000 mg | ORAL_TABLET | Freq: Three times a day (TID) | ORAL | Status: DC | PRN

## 2016-01-23 MED ORDER — ATORVASTATIN CALCIUM 10 MG PO TABS: ORAL_TABLET | ORAL | Status: DC

## 2016-01-23 MED ORDER — HYDROCHLOROTHIAZIDE 25 MG PO TABS: ORAL_TABLET | ORAL | Status: DC

## 2016-01-23 NOTE — Patient Instructions (Signed)
Good to see you. Please return for your pneumonia vaccine.

## 2016-01-23 NOTE — Assessment & Plan Note (Signed)
No changes made to to rxs today. Continue statin.

## 2016-01-23 NOTE — Addendum Note (Signed)
Addended by: Modena Nunnery on: 01/23/2016 08:45 AM   Modules accepted: Orders

## 2016-01-23 NOTE — Assessment & Plan Note (Signed)
Symptoms resolved s/p cipro.

## 2016-01-23 NOTE — Assessment & Plan Note (Signed)
Recheck potassium No changes made to rxs today.

## 2016-01-23 NOTE — Progress Notes (Signed)
Subjective:   Patient ID: Robin Peck, female    DOB: [birth year], 70 y.o.   MRN: [id number]  KAIDEE Peck is a pleasant 70 y.o. year old female who presents to clinic today with Follow-up  on 01/23/2016  HPI:  Saw Dr. Derrel Nip for cystitis last week- note reviewed. Treated with cipro. Urine cx consistent with contaminant.  Lung nodule- followed by Dr. Prescott Gum. Last saw him on 10/11/15. Note reviewed- left 1.2 cm left lower lobe nodule.  Neg PET scan. Advised follow up CT in 6 months.    HTN-   Taking HCTZ 25 mg and Norvasc 5 mg daily for over a year. Also takes Klor Con 40 meq. Denies any CP, SOB, LE edema, blurred vision.   Lab Results  Component Value Date   CREATININE 0.72 08/31/2015   Does have h/o hypokalemia. Lab Results  Component Value Date   K 3.1* 08/31/2015   HLD- takes lipitor 10 mg daily. Lab Results  Component Value Date   CHOL 183 07/19/2015   HDL 44.00 07/19/2015   LDLCALC 108* 07/19/2015   LDLDIRECT 113.0 01/24/2015   TRIG 156.0* 07/19/2015   CHOLHDL 4 07/19/2015   Lab Results  Component Value Date   ALT 16 07/19/2015   AST 17 07/19/2015   ALKPHOS 77 07/19/2015   BILITOT 0.5 07/19/2015   Current Outpatient Prescriptions on File Prior to Visit  Medication Sig Dispense Refill  . ALPRAZolam (XANAX) 0.25 MG tablet Take 1 tablet (0.25 mg total) by mouth 2 (two) times daily as needed for anxiety or sleep. 30 tablet 0  . cetirizine (ZYRTEC) 10 MG tablet TAKE 1 TABLET (10 MG TOTAL) BY MOUTH DAILY. 90 tablet 0  . hydrochlorothiazide (HYDRODIURIL) 25 MG tablet TAKE 1 TABLET (25 MG TOTAL) BY MOUTH DAILY. 90 tablet 2  . KLOR-CON M20 20 MEQ tablet TAKE 1 TABLET BY MOUTH 2 TIMES DAILY 60 tablet 3  . LIPITOR 10 MG tablet TAKE 1 TABLET (10 MG TOTAL) BY MOUTH DAILY. 90 tablet 2  . NORVASC 5 MG tablet TAKE 1 TO 2 TABLETS EVERY DAY 180 tablet 1  . [DISCONTINUED] potassium chloride 20 MEQ TBCR Take 10 mEq by mouth 2 (two) times daily. 30 tablet 0   No current  facility-administered medications on file prior to visit.    Allergies  Allergen Reactions  . Lisinopril     REACTION: cough    Past Medical History  Diagnosis Date  . Allergic rhinitis   . Hyperlipidemia   . Hypertension     Past Surgical History  Procedure Laterality Date  . Gyn surgery  2002    hysterectomy    Family History  Problem Relation Age of Onset  . Heart attack Brother   . Hypertension Brother   . Hypertension Father   . Hypertension Mother   . Hypertension Sister     Social History   Social History  . Marital Status: Married    Spouse Name: N/A  . Number of Children: 1  . Years of Education: N/A   Occupational History  . Not on file.   Social History Main Topics  . Smoking status: Never Smoker   . Smokeless tobacco: Not on file  . Alcohol Use: No  . Drug Use: No  . Sexual Activity: Not on file   Other Topics Concern  . Not on file   Social History Narrative   The PMH, PSH, Social History, Family History, Medications, and allergies have been reviewed in  [facility], and have been updated if relevant.  Review of Systems  Constitutional: Negative.   Respiratory: Negative.   Cardiovascular: Negative.   Gastrointestinal: Negative.   Endocrine: Negative.   Genitourinary: Negative.   Musculoskeletal: Negative for myalgias and neck pain.  Skin: Negative.   Allergic/Immunologic: Negative.   Neurological: Negative.   Psychiatric/Behavioral: Negative.   All other systems reviewed and are negative.      Objective:    BP 148/76 mmHg  Pulse 83  Temp(Src) 97.7 F (36.5 C) (Oral)  Wt 130 lb (58.968 kg)  SpO2 97%   Physical Exam  Constitutional: She is oriented to person, place, and time. She appears well-developed and well-nourished. No distress.  HENT:  Head: Normocephalic.  Eyes: Conjunctivae are normal.  Cardiovascular: Normal rate and regular rhythm.   Pulmonary/Chest: Effort normal and breath sounds normal.  Musculoskeletal: Normal  range of motion. She exhibits no edema.  Neurological: She is alert and oriented to person, place, and time. No cranial nerve deficit.  Skin: Skin is warm and dry. She is not diaphoretic.  Psychiatric: She has a normal mood and affect. Her behavior is normal. Judgment and thought content normal.  Nursing note and vitals reviewed.         Assessment & Plan:   Essential hypertension  HLD (hyperlipidemia)  Lung nodule seen on imaging study  Hypokalemia  Cystitis No Follow-up on file.

## 2016-01-23 NOTE — Progress Notes (Signed)
Pre visit review using our clinic review tool, if applicable. No additional management support is needed unless otherwise documented below in the visit note. 

## 2016-01-23 NOTE — Assessment & Plan Note (Signed)
Reasonable control. No changes made today. 

## 2016-02-14 ENCOUNTER — Ambulatory Visit (INDEPENDENT_AMBULATORY_CARE_PROVIDER_SITE_OTHER): Payer: Commercial Managed Care - HMO

## 2016-02-14 DIAGNOSIS — Z23 Encounter for immunization: Secondary | ICD-10-CM | POA: Diagnosis not present

## 2016-02-28 ENCOUNTER — Ambulatory Visit
Admission: RE | Admit: 2016-02-28 | Discharge: 2016-02-28 | Disposition: A | Discharge status: Home / Self Care | Payer: Commercial Managed Care - HMO | Source: Ambulatory Visit | Attending: Cardiothoracic Surgery | Admitting: Cardiothoracic Surgery

## 2016-02-28 ENCOUNTER — Encounter: Payer: Self-pay | Admitting: Cardiothoracic Surgery

## 2016-02-28 ENCOUNTER — Ambulatory Visit (INDEPENDENT_AMBULATORY_CARE_PROVIDER_SITE_OTHER): Payer: Commercial Managed Care - HMO | Admitting: Cardiothoracic Surgery

## 2016-02-28 VITALS — BP 150/67 | HR 75 | Resp 16 | Ht 60.0 in | Wt 130.0 lb

## 2016-02-28 DIAGNOSIS — D381 Neoplasm of uncertain behavior of trachea, bronchus and lung: Secondary | ICD-10-CM | POA: Diagnosis not present

## 2016-02-28 DIAGNOSIS — R918 Other nonspecific abnormal finding of lung field: Secondary | ICD-10-CM

## 2016-02-28 NOTE — Progress Notes (Signed)
PCP is Arnette Norris, MD Referring Provider is Lucille Passy, MD  Chief Complaint  Patient presents with  . Follow-up    4 month f/u with Chest CT  . Lung Lesion    HPI: Patient returns for 6 month follow-up with CT scan to assess a 1.2 cm left lower lobe nodule. On her initial evaluation this was PET negative. She is asymptomatic and has no history of smoking. 30 years ago she had surgery on her neck in [place] to remove nodules or lymph nodes per her recall. No history of malignancy Today's CT scan shows the left pleural nodule to be stable without change in size. At this point it appears to be benign. We will follow-up a CT scan in 6 months to confirm that this is a benign nodule. Because of the stability of the nodule and its negative activity and PET scan I do not feel that a biopsy is indicated.   Past Medical History  Diagnosis Date  . Allergic rhinitis   . Hyperlipidemia   . Hypertension     Past Surgical History  Procedure Laterality Date  . Gyn surgery  2002    hysterectomy    Family History  Problem Relation Age of Onset  . Heart attack Brother   . Hypertension Brother   . Hypertension Father   . Hypertension Mother   . Hypertension Sister     Social History Social History  Substance Use Topics  . Smoking status: Never Smoker   . Smokeless tobacco: None  . Alcohol Use: No    Current Outpatient Prescriptions  Medication Sig Dispense Refill  . ALPRAZolam (XANAX) 0.25 MG tablet Take 1 tablet (0.25 mg total) by mouth 2 (two) times daily as needed for anxiety or sleep. 30 tablet 0  . atorvastatin (LIPITOR) 10 MG tablet TAKE 1 TABLET (10 MG TOTAL) BY MOUTH DAILY. 90 tablet 2  . cetirizine (ZYRTEC) 10 MG tablet TAKE 1 TABLET (10 MG TOTAL) BY MOUTH DAILY. 90 tablet 0  . cyclobenzaprine (FLEXERIL) 5 MG tablet Take 1 tablet (5 mg total) by mouth 3 (three) times daily as needed for muscle spasms. 60 tablet 1  . hydrochlorothiazide (HYDRODIURIL) 25 MG tablet TAKE 1  TABLET (25 MG TOTAL) BY MOUTH DAILY. 90 tablet 2  . KLOR-CON M20 20 MEQ tablet TAKE 1 TABLET BY MOUTH 2 TIMES DAILY 60 tablet 3  . NORVASC 5 MG tablet TAKE 1 TO 2 TABLETS EVERY DAY 180 tablet 2  . [DISCONTINUED] potassium chloride 20 MEQ TBCR Take 10 mEq by mouth 2 (two) times daily. 30 tablet 0   No current facility-administered medications for this visit.    Allergies  Allergen Reactions  . Lisinopril     REACTION: cough    Review of Systems  No fever no weight change Patient had a Pneumovax vaccine last month and had a course of myalgia which is now resolved Patient complains of chronic lower neck pain radiating to the shoulders which interferes with her daily housework and for which she receives a deep massage at least once a month. It appears she probably has cervical spine disease and will seek assessment by a neurosurgeon.   BP 150/67 mmHg  Pulse 75  Resp 16  Ht 5' (1.524 m)  Wt 130 lb (58.968 kg)  BMI 25.39 kg/m2  SpO2 96% Physical Exam       Exam    General- alert and comfortable   Lungs- clear without rales, wheezes   Cor- regular  rate and rhythm, no murmur , gallop   Abdomen- soft, non-tender   Extremities - warm, non-tender, minimal edema   Neuro- oriented, appropriate, no focal weakness   Diagnostic Tests: CT scan personally reviewed and counseled with patient and husband. Left lower lobe peripheral nodule completely stable in size and morphology. No suspicious mediastinal adenopathy.  Impression: Probable benign pulmonary nodule. We'll repeat CT scan in 6 months to confirm there is no malignant potential.  Plan:Return in 6 months with CT scan of chest.   Len Childs, MD [facility] [phone]

## 2016-03-11 ENCOUNTER — Ambulatory Visit (INDEPENDENT_AMBULATORY_CARE_PROVIDER_SITE_OTHER)
Admission: RE | Admit: 2016-03-11 | Discharge: 2016-03-11 | Disposition: A | Discharge status: Home / Self Care | Payer: Commercial Managed Care - HMO | Source: Ambulatory Visit | Attending: Family Medicine | Admitting: Family Medicine

## 2016-03-11 ENCOUNTER — Ambulatory Visit (INDEPENDENT_AMBULATORY_CARE_PROVIDER_SITE_OTHER): Payer: Commercial Managed Care - HMO | Admitting: Family Medicine

## 2016-03-11 ENCOUNTER — Encounter: Payer: Self-pay | Admitting: Family Medicine

## 2016-03-11 VITALS — BP 156/82 | HR 84 | Temp 98.0°F | Wt 128.2 lb

## 2016-03-11 DIAGNOSIS — M542 Cervicalgia: Secondary | ICD-10-CM

## 2016-03-11 NOTE — Progress Notes (Signed)
Pre visit review using our clinic review tool, if applicable. No additional management support is needed unless otherwise documented below in the visit note. 

## 2016-03-11 NOTE — Patient Instructions (Signed)
Good to see you. I will call you with your xray results and we will call you with a neurosurgery appointment.

## 2016-03-11 NOTE — Progress Notes (Signed)
Subjective:   Patient ID: Robin Peck, female    DOB: July 21, 1946, 70 y.o.   MRN: [id number]  Robin Peck is a pleasant 70 y.o. year old female who presents to clinic today with Neck Pain and Shoulder Pain  on 03/11/2016  HPI:  Years of chronic neck pain.  Has been taking Ibuprofen and Tylenol.  Tried to cut back on NSAIDs and pain has become quite severe.  Now has radiculopathy to top of her head and down her hands bilaterally.  She told her thoracic surgeon (Dr. Nils Pyle).  Per pt, was told she should see Dr. Deri Fuelling.  Does feel perhaps her grip strength is diminished.  Current Outpatient Prescriptions on File Prior to Visit  Medication Sig Dispense Refill  . ALPRAZolam (XANAX) 0.25 MG tablet Take 1 tablet (0.25 mg total) by mouth 2 (two) times daily as needed for anxiety or sleep. 30 tablet 0  . atorvastatin (LIPITOR) 10 MG tablet TAKE 1 TABLET (10 MG TOTAL) BY MOUTH DAILY. 90 tablet 2  . cetirizine (ZYRTEC) 10 MG tablet TAKE 1 TABLET (10 MG TOTAL) BY MOUTH DAILY. 90 tablet 0  . cyclobenzaprine (FLEXERIL) 5 MG tablet Take 1 tablet (5 mg total) by mouth 3 (three) times daily as needed for muscle spasms. 60 tablet 1  . hydrochlorothiazide (HYDRODIURIL) 25 MG tablet TAKE 1 TABLET (25 MG TOTAL) BY MOUTH DAILY. 90 tablet 2  . KLOR-CON M20 20 MEQ tablet TAKE 1 TABLET BY MOUTH 2 TIMES DAILY 60 tablet 3  . NORVASC 5 MG tablet TAKE 1 TO 2 TABLETS EVERY DAY 180 tablet 2  . [DISCONTINUED] potassium chloride 20 MEQ TBCR Take 10 mEq by mouth 2 (two) times daily. 30 tablet 0   No current facility-administered medications on file prior to visit.    Allergies  Allergen Reactions  . Lisinopril     REACTION: cough    Past Medical History  Diagnosis Date  . Allergic rhinitis   . Hyperlipidemia   . Hypertension     Past Surgical History  Procedure Laterality Date  . Gyn surgery  2002    hysterectomy    Family History  Problem Relation Age of Onset  . Heart attack  Brother   . Hypertension Brother   . Hypertension Father   . Hypertension Mother   . Hypertension Sister     Social History   Social History  . Marital Status: Married    Spouse Name: N/A  . Number of Children: 1  . Years of Education: N/A   Occupational History  . Not on file.   Social History Main Topics  . Smoking status: Never Smoker   . Smokeless tobacco: Not on file  . Alcohol Use: No  . Drug Use: No  . Sexual Activity: Not on file   Other Topics Concern  . Not on file   Social History Narrative   The PMH, PSH, Social History, Family History, Medications, and allergies have been reviewed in [facility], and have been updated if relevant.   Review of Systems  Constitutional: Negative.   Eyes: Negative.   Musculoskeletal: Positive for back pain, arthralgias and neck pain. Negative for joint swelling, gait problem and neck stiffness.  Neurological: Positive for numbness. Negative for dizziness, light-headedness and headaches.  Psychiatric/Behavioral: The patient is nervous/anxious.   All other systems reviewed and are negative.      Objective:    BP 156/82 mmHg  Pulse 84  Temp(Src) 98 F (36.7 C) (  Oral)  Wt 128 lb 4 oz (58.174 kg)  SpO2 98%   Physical Exam  Constitutional: She is oriented to person, place, and time. She appears well-developed and well-nourished. No distress.  HENT:  Head: Normocephalic.  Eyes: Conjunctivae are normal.  Cardiovascular: Normal rate.   Pulmonary/Chest: Effort normal.  Musculoskeletal:       Cervical back: She exhibits normal range of motion, no tenderness, no bony tenderness, no edema, no deformity and normal pulse.  Neurological: She is alert and oriented to person, place, and time. No cranial nerve deficit.  Normal grip strength bilaterally  Skin: Skin is warm and dry. She is not diaphoretic.  Psychiatric: She has a normal mood and affect. Her behavior is normal. Judgment and thought content normal.            Assessment & Plan:   Cervical pain (neck) No Follow-up on file.

## 2016-03-11 NOTE — Assessment & Plan Note (Signed)
Given new and worsening symptoms, agree with referral to neurosurgery. Cervical plain films here today. May need MRI as well. The patient indicates understanding of these issues and agrees with the plan.

## 2016-04-13 ENCOUNTER — Other Ambulatory Visit: Payer: Self-pay | Admitting: Family Medicine

## 2016-05-01 ENCOUNTER — Ambulatory Visit: Payer: Commercial Managed Care - HMO | Attending: Neurosurgery

## 2016-05-01 VITALS — BP 155/64 | HR 68

## 2016-05-01 DIAGNOSIS — M6281 Muscle weakness (generalized): Secondary | ICD-10-CM | POA: Insufficient documentation

## 2016-05-01 DIAGNOSIS — M5412 Radiculopathy, cervical region: Secondary | ICD-10-CM | POA: Diagnosis present

## 2016-05-01 DIAGNOSIS — M542 Cervicalgia: Secondary | ICD-10-CM | POA: Diagnosis present

## 2016-05-01 NOTE — Therapy (Signed)
[facility] [address] [place], [address] Phone: [phone]   Fax:  [phone]  Physical Therapy Evaluation  Patient Details  Name: Robin Peck MRN: [id number] Date of Birth: 1946/08/15 Referring Provider: Mallie Mussel A. Pool, MD  Encounter Date: 05/01/2016      PT End of Session - 05/01/16 0931    Visit Number 1   Number of Visits 13   Date for PT Re-Evaluation 06/13/16   Authorization Type --   Authorization Time Period --   PT Start Time 0933   PT Stop Time 1037   PT Time Calculation (min) 64 min   Activity Tolerance Patient tolerated treatment well   Behavior During Therapy [facility] for tasks assessed/performed      Past Medical History:  Diagnosis Date  . Allergic rhinitis   . Hyperlipidemia   . Hypertension     Past Surgical History:  Procedure Laterality Date  . gyn surgery  2002   hysterectomy    Vitals:   05/01/16 0939  BP: (!) 155/64  Pulse: 68         Subjective Assessment - 05/01/16 0939    Subjective neck pain: 0/10 current, 8/10 at worst.    Pertinent History Neck pain for over 20 years. 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 Neck pain progressed as years went on. Had chiropractic, accupuncture, and massage treatment which helped temporarily. Continued with massage therapy to help ease her pain. Took Advil which helps with her pain.  Changed to Tylenol to decrease risk of CVA or heart attack. Her pain however increase since Tylenol is not anti-inflammatory medication. Pt also has difficulty controlling her blood pressure due to increased pain. Pain elevates her blood pressure. Had an x-ray in her neck which revealed issues at her neck bones. Was referred to a neurologist. Had an MRI performed for her neck which revealed a bone encroachment in her neck and was referred to PT.  Pt states that she can function normally but lifting, looking up,  turning her head increases her neck pain.  When her neck bothers her, her fingers swell up as well.  Pain does not wake her up at night if her neck is positioned properly (uses flat pillows, R S/L ). Pt also states having radiating symptoms bilateral UE.  Bilateral hand swelling occurs when her neck bothers her.  Pt also states feeling relief from her neck when her massage therapist pulls on her neck but gets muscle bulges which takes 2 days to subside. Goal is also to avoid surgery.    Patient Stated Goals Turn her head better to look behind her when driving. Avoid surgery.   Currently in Pain? Yes   Pain Score 0-No pain   Pain Location Neck   Pain Orientation Posterior;Right;Left   Pain Type Chronic pain   Pain Onset More than a month ago   Pain Frequency Occasional   Aggravating Factors  lifting, looking up, turning her head            [facility] PT Assessment - 05/01/16 1001      Assessment   Medical Diagnosis Spinal stenosis, cervical region   Referring Provider Mallie Mussel A. Pool, MD   Onset Date/Surgical Date --  20 years ago   Hand Dominance Left   Next MD Visit May 15, 2016 ?   Prior Therapy Had prior treatment which involved cervical traction using a pulley which  made her symptoms worse. Also had Chiropractic, acupuncture, and massage treatment which helped temporarily. Took Advil which helped decrease inflammation but had to stop. Switched to Tylenol which does not help.      Precautions   Precaution Comments history of L pleural nodule per medical records; possible fall risk     Restrictions   Other Position/Activity Restrictions no known restrictions     Balance Screen   Has the patient fallen in the past 6 months Yes   Has the patient had a decrease in activity level because of a fear of falling?  --  Pt states fear of falling   Is the patient reluctant to leave their home because of a fear of falling?  --  Pt states fear of falling     Home Environment   Additional  Comments Patient lives in a 2 story home with her husband, 4 steps to enter with bilateral rail, 12 steps inside with R rail     Prior Function   Vocation Retired   [employer] PLOF: better able to lift objects, look up, turn her head with less neck pain   Leisure read     Observation/Other Assessments   Observations C5, C6, C7 radiating symptoms proximally (posterior neck and posterior bilateral shoulders and thoracic spine) based on area drawn on body diagram by patient.    Neck Disability Index  22%     Posture/Postural Control   Posture Comments bilaterally protracted shoulders and neck, movement crease around C5/C6, L scapula slightly higher, slight R cervical side bend     AROM   Overall AROM Comments starting cervical positon: 20 degrees R cervical rotation, 16 degrees cervical extension. Bilateral UE AROM WFL except L shoulder IR   Cervical Flexion 30 degrees with R posterior cervical to R posterior lower thoracic tightness   Cervical Extension 48 degrees with R posterior cervical pain (along paraspinals)   Cervical - Right Side Bend 25 degrees with L UT area tightness   Cervical - Left Side Bend 19 degrees with R UT area tightness   Cervical - Right Rotation 60  with L UT area tightness   Cervical - Left Rotation 45  with R UT area tightness     Strength   Right Shoulder Flexion 4/5   Right Shoulder ABduction 4+/5   Left Shoulder Flexion 4/5  with slight cervical extension   Left Shoulder ABduction 4/5   Right Elbow Flexion 4+/5   Right Elbow Extension 4+/5   Left Elbow Flexion 4/5   Left Elbow Extension 4+/5   Right Wrist Extension 4+/5   Left Wrist Extension 4+/5     Palpation   Palpation comment Increased tone L cervical paraspinals, increased tension L upper trap                Objectives  There-ex  Directed patient with chin tucks 10x5 seconds  Gentle chin tuck position to promote neutral neck position.   Reviewed and given as part of  her HEP (chin tucks 10x3 with 5 seconds daily and neutral neck position throughout the day). Pt demonstrated and verbalized understanding.   L scapular retraction targeting lower trap 10x5 seconds.     Improved exercise technique, movement at target joints, use of target muscles after mod verbal, visual, tactile cues.            PT Education - 05/01/16 2015    Education provided Yes   Education Details ther-ex, HEP, plan of care  Person(s) Educated Patient   Methods Explanation;Demonstration;Tactile cues;Verbal cues   Comprehension Verbalized understanding;Returned demonstration             PT Long Term Goals - 05/01/16 1924      PT LONG TERM GOAL #1   Title Patient will have a decrease in neck pain to 5/10 or less at worst to promote ability to turn her head as well as to look up, and lift objects.    Baseline 8/10 at worst   Time 6   Period Weeks   Status New     PT LONG TERM GOAL #2   Title Patient will improve L cervical rotation to at least 55 degrees without pain to promote ability to turn her head.   Baseline 45 degrees   Time 6   Period Weeks   Status New     PT LONG TERM GOAL #3   Title Patient will improve her Neck Disability Index score by at least 6% as a demonstration of improved function.    Baseline 22%   Time 6   Period Weeks   Status New     PT LONG TERM GOAL #4   Title Patient will improve L shoulder flexion, abduction, and L biceps flexion strength by at least 1/2 MMT grade to promote ability to use her L UE for functional tasks.    Baseline 4/5   Time 6   Period Weeks   Status New               Plan - 05/01/16 1917    Clinical Impression Statement Patient is a 70 year old female who came to physical therapy secondary to neck pain. She also presents with poor posture, movement preference around C5/C6 area, L shoulder and bicep weakness, increased L cervical paraspinal and L upper trap muscle tension, reproduction of symptoms with  cervical AROM, and difficulty performing neck movements such as looking up, or turning her head. Patient will benefit from skilled physical therapy services to address the aforementioned deficits.    Rehab Potential Fair   Clinical Impairments Affecting Rehab Potential Chronicity of condition   PT Frequency 2x / week   PT Duration 6 weeks   PT Treatment/Interventions Therapeutic exercise;Manual techniques;Therapeutic activities;Patient/family education;Dry needling;Neuromuscular re-education   PT Next Visit Plan soft tissue mobilization, scapular strengthening, thoracic extension   Consulted and Agree with Plan of Care Patient      Patient will benefit from skilled therapeutic intervention in order to improve the following deficits and impairments:  Pain, Postural dysfunction, Improper body mechanics, Decreased range of motion  Visit Diagnosis: Cervicalgia - Plan: PT plan of care cert/re-cert  Radiculopathy, cervical region - Plan: PT plan of care cert/re-cert  Muscle weakness (generalized) - Plan: PT plan of care cert/re-cert     Problem List Patient Active Problem List   Diagnosis Date Noted  . Cervical pain (neck) 03/11/2016  . Lung nodule seen on imaging study 08/31/2015  . Palpitations 06/01/2015  . Panic attack as reaction to stress 06/01/2015  . Hypokalemia 08/02/2014  . Benign paroxysmal positional vertigo 08/02/2014  . DJD (degenerative joint disease) of cervical spine 02/17/2013  . HLD (hyperlipidemia) 11/08/2010  . Essential hypertension 11/08/2010  . ALLERGIC RHINITIS 11/08/2010    Thank you for your referral.   Joneen Boers PT, DPT   05/01/2016, 8:27 PM  [facility] [address] [place], [address] Phone: [phone]   Fax:  [phone]  Name: Robin Peck MRN: [id number] Date of Birth: Jan 10, 1946

## 2016-05-01 NOTE — Patient Instructions (Signed)
Reviewed and given chin tucks 10x3 with 5 seconds daily and neutral neck position throughout the day as part of her HEP. Pt demonstrated and verbalized understanding.

## 2016-05-08 ENCOUNTER — Ambulatory Visit: Payer: 59 | Attending: Neurosurgery

## 2016-05-08 DIAGNOSIS — M6281 Muscle weakness (generalized): Secondary | ICD-10-CM | POA: Diagnosis present

## 2016-05-08 DIAGNOSIS — M542 Cervicalgia: Secondary | ICD-10-CM

## 2016-05-08 DIAGNOSIS — M5412 Radiculopathy, cervical region: Secondary | ICD-10-CM | POA: Diagnosis present

## 2016-05-08 NOTE — Therapy (Signed)
[facility] [address] [place], [address] Phone: [phone]   Fax:  [phone]  Physical Therapy Treatment  Patient Details  Name: Robin Peck MRN: [id number] Date of Birth: [birth year] Referring Provider: Mallie Mussel A. Pool, MD  Encounter Date: 05/08/2016      PT End of Session - 05/08/16 1038    Visit Number 2   Number of Visits 13   Date for PT Re-Evaluation 06/13/16   PT Start Time 1038   PT Stop Time 1119   PT Time Calculation (min) 41 min   Activity Tolerance Patient tolerated treatment well   Behavior During Therapy WFL for tasks assessed/performed      Past Medical History:  Diagnosis Date  . Allergic rhinitis   . Hyperlipidemia   . Hypertension     Past Surgical History:  Procedure Laterality Date  . gyn surgery  2002   hysterectomy    There were no vitals filed for this visit.      Subjective Assessment - 05/08/16 1039    Subjective Neck is a little bit better. A little bit stressed out due to having 2 funerals in 1 week. No pain currently. Just the tighness. Feels more controlled this time (pain). Did not take ibuprofen last night.    Pertinent History Neck pain for over 20 years. 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 Neck pain progressed as years went on. Had chiropractic, accupuncture, and massage treatment which helped temporarily. Continued with massage therapy to help ease her pain. Took Advil which helps with her pain.  Changed to Tylenol to decrease risk of CVA or heart attack. Her pain however increase since Tylenol is not anti-inflammatory medication. Pt also has difficulty controlling her blood pressure due to increased pain. Pain elevates her blood pressure. Had an x-ray in her neck which revealed issues at her neck bones. Was referred to a neurologist. Had an MRI performed for her neck which revealed a bone encroachment in her neck and  was referred to PT.  Pt states that she can function normally but lifting, looking up, turning her head increases her neck pain.  When her neck bothers her, her fingers swell up as well.  Pain does not wake her up at night if her neck is positioned properly (uses flat pillows, R S/L ). Pt also states having radiating symptoms bilateral UE.  Bilateral hand swelling occurs when her neck bothers her.  Pt also states feeling relief from her neck when her massage therapist pulls on her neck but gets muscle bulges which takes 2 days to subside. Goal is also to avoid surgery.    Patient Stated Goals Turn her head better to look behind her when driving. Avoid surgery.   Currently in Pain? No/denies   Pain Onset More than a month ago                       Objectives  There-ex  Directed patient with seated chin tucks 10x5 seconds  Seated L scapular retraction targeting L lower trap muscle 10x3 with 5 seconds  Reviewed and given as part of her HEP. Pt demonstrated and verbalized understanding.   Supine open books (horizontal abduction) 10x3 with 5 second holds  Reviewed and given as part of her HEP. Pt demonstrated and verbalized understanding.   Supine chin tuck with cervical rotation 10x2 each side  Standing L shoulder extension  resisting yellow band 5x (L UT used; cues needed to decrease use)  Lower trap raise at the wall 10x5 seconds, tactile cues needed to decrease L UT use and increase L lower trap use.    Improved exercise technique, movement at target joints, use of target muscles after mod verbal, visual, tactile cues.     Decreased L UT muscle tension with exercises activating her lower trap muscle. Decreased extension pressure around C5/C6 area with chin tucks. Decreased neck symptoms after session.          PT Education - 05/08/16 1050    Education provided Yes   Education Details ther-ex, HEP   Person(s) Educated Patient   Methods  Explanation;Demonstration;Tactile cues;Verbal cues;Handout   Comprehension Returned demonstration;Verbalized understanding             PT Long Term Goals - 05/01/16 1924      PT LONG TERM GOAL #1   Title Patient will have a decrease in neck pain to 5/10 or less at worst to promote ability to turn her head as well as to look up, and lift objects.    Baseline 8/10 at worst   Time 6   Period Weeks   Status New     PT LONG TERM GOAL #2   Title Patient will improve L cervical rotation to at least 55 degrees without pain to promote ability to turn her head.   Baseline 45 degrees   Time 6   Period Weeks   Status New     PT LONG TERM GOAL #3   Title Patient will improve her Neck Disability Index score by at least 6% as a demonstration of improved function.    Baseline 22%   Time 6   Period Weeks   Status New     PT LONG TERM GOAL #4   Title Patient will improve L shoulder flexion, abduction, and L biceps flexion strength by at least 1/2 MMT grade to promote ability to use her L UE for functional tasks.    Baseline 4/5   Time 6   Period Weeks   Status New               Plan - 05/08/16 1055    Clinical Impression Statement Decreased L UT muscle tension with exercises activating her lower trap muscle. Decreased extension pressure around C5/C6 area with chin tucks. Decreased neck symptoms after session.    Rehab Potential Fair   Clinical Impairments Affecting Rehab Potential Chronicity of condition   PT Frequency 2x / week   PT Duration 6 weeks   PT Treatment/Interventions Therapeutic exercise;Manual techniques;Therapeutic activities;Patient/family education;Dry needling;Neuromuscular re-education   PT Next Visit Plan soft tissue mobilization, scapular strengthening, thoracic extension   Consulted and Agree with Plan of Care Patient      Patient will benefit from skilled therapeutic intervention in order to improve the following deficits and impairments:  Pain,  Postural dysfunction, Improper body mechanics, Decreased range of motion  Visit Diagnosis: Cervicalgia  Radiculopathy, cervical region  Muscle weakness (generalized)     Problem List Patient Active Problem List   Diagnosis Date Noted  . Cervical pain (neck) 03/11/2016  . Lung nodule seen on imaging study 08/31/2015  . Palpitations 06/01/2015  . Panic attack as reaction to stress 06/01/2015  . Hypokalemia 08/02/2014  . Benign paroxysmal positional vertigo 08/02/2014  . DJD (degenerative joint disease) of cervical spine 02/17/2013  . HLD (hyperlipidemia) 11/08/2010  . Essential hypertension 11/08/2010  . ALLERGIC RHINITIS  11/08/2010    Joneen Boers PT, DPT   05/08/2016, 9:11 PM  [facility] [address] [place], [address] Phone: [phone]   Fax:  [phone]  Name: FONTELLA DELINE MRN: [id number] Date of Birth: [birth year]

## 2016-05-08 NOTE — Patient Instructions (Addendum)
    Scapular Retraction   Left shoulder blade only.  Perform a gentle chin tuck the entire time.  Pinch left shoulder blade back to place it into your right back pant pocket. Hold for 5 seconds. Repeat __10__ times per set. Do __3__ sets per session.     Copyright  VHI. All rights reserved.     Horizontal Abduction / Adduction    On your back on your bed,   straighten both arms in front of body at chest level. Pull arms out from midline. Then bring arms forward and together.  Hold for 5 seconds.   Repeat 10 time.   Perform 3 sets daily.   Copyright  VHI. All rights reserved.

## 2016-05-13 ENCOUNTER — Ambulatory Visit: Payer: 59

## 2016-05-13 DIAGNOSIS — M6281 Muscle weakness (generalized): Secondary | ICD-10-CM

## 2016-05-13 DIAGNOSIS — M5412 Radiculopathy, cervical region: Secondary | ICD-10-CM

## 2016-05-13 DIAGNOSIS — M542 Cervicalgia: Secondary | ICD-10-CM | POA: Diagnosis not present

## 2016-05-13 NOTE — Therapy (Signed)
[facility] [address] [place], [address] Phone: [phone]   Fax:  [phone]  Physical Therapy Treatment  Patient Details  Name: Robin Peck MRN: [id number] Date of Birth: [birth year] Referring Provider: Mallie Mussel A. Pool, MD  Encounter Date: 05/13/2016      PT End of Session - 05/13/16 1038    Visit Number 3   Number of Visits 13   Date for PT Re-Evaluation 06/13/16   PT Start Time 1038   PT Stop Time 1122   PT Time Calculation (min) 44 min   Activity Tolerance Patient tolerated treatment well   Behavior During Therapy WFL for tasks assessed/performed      Past Medical History:  Diagnosis Date  . Allergic rhinitis   . Hyperlipidemia   . Hypertension     Past Surgical History:  Procedure Laterality Date  . gyn surgery  2002   hysterectomy    There were no vitals filed for this visit.      Subjective Assessment - 05/13/16 1039    Subjective I can feel a little bit improvement pain wise. Pain is a little bit controlled right now. Sleeping still bothers her neck due to positioning. Sleeping with her neck is less extended helps.  4-5/10 neck pain at most for the past 5 days.  The exercises help better than massage.    Pertinent History Neck pain for over 20 years. 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 Neck pain progressed as years went on. Had chiropractic, accupuncture, and massage treatment which helped temporarily. Continued with massage therapy to help ease her pain. Took Advil which helps with her pain.  Changed to Tylenol to decrease risk of CVA or heart attack. Her pain however increase since Tylenol is not anti-inflammatory medication. Pt also has difficulty controlling her blood pressure due to increased pain. Pain elevates her blood pressure. Had an x-ray in her neck which revealed issues at her neck bones. Was referred to a neurologist. Had an MRI  performed for her neck which revealed a bone encroachment in her neck and was referred to PT.  Pt states that she can function normally but lifting, looking up, turning her head increases her neck pain.  When her neck bothers her, her fingers swell up as well.  Pain does not wake her up at night if her neck is positioned properly (uses flat pillows, R S/L ). Pt also states having radiating symptoms bilateral UE.  Bilateral hand swelling occurs when her neck bothers her.  Pt also states feeling relief from her neck when her massage therapist pulls on her neck but gets muscle bulges which takes 2 days to subside. Goal is also to avoid surgery.    Patient Stated Goals Turn her head better to look behind her when driving. Avoid surgery.   Currently in Pain? No/denies   Pain Onset More than a month ago                            Objectives  There-ex  Directed patient with lower trap raise at the wall 10x5 seconds, then 3x 5 seconds, tactile cues needed to decrease L UT use and increase L lower trap use.   L scapular retraction and depression targeting the lower trap muscles 10x2 with 5 second holds,  Seated trunk rotation (feet propped on stool/lumbar locked position) 5x5 second holds  to promote thoracic mobility  Supine open books (horizontal abduction) 10x3 with 5 second holds to promote thoracic extension  Supine chin tuck (with addition of bilateral scapular retraction) with cervical rotation 10x2 each side  Supine chin tuck with cervical flexion 5x3  Seated bilateral shoulder ER with chin tuck and scapular retraction 10x2    Improved exercise technique, movement at target joints, use of target muscles after mod verbal, visual, tactile cues.     Pt demonstrates tendency to use her L UT muscle, needing consistent cues to decrease use, and improve utilization of lower and mid trap muscles with exercises. Difficulty performing supine cervical flexion with chin tuck  without activating sternocleidomastoid. Pt tolerated session well without aggravation of symptoms.                                  PT Education - 05/13/16 1100    Education provided Yes   Education Details ther-ex   [employer]) Educated Patient   Methods Explanation;Demonstration;Tactile cues;Verbal cues   Comprehension Returned demonstration;Verbalized understanding             PT Long Term Goals - 05/01/16 1924      PT LONG TERM GOAL #1   Title Patient will have a decrease in neck pain to 5/10 or less at worst to promote ability to turn her head as well as to look up, and lift objects.    Baseline 8/10 at worst   Time 6   Period Weeks   Status New     PT LONG TERM GOAL #2   Title Patient will improve L cervical rotation to at least 55 degrees without pain to promote ability to turn her head.   Baseline 45 degrees   Time 6   Period Weeks   Status New     PT LONG TERM GOAL #3   Title Patient will improve her Neck Disability Index score by at least 6% as a demonstration of improved function.    Baseline 22%   Time 6   Period Weeks   Status New     PT LONG TERM GOAL #4   Title Patient will improve L shoulder flexion, abduction, and L biceps flexion strength by at least 1/2 MMT grade to promote ability to use her L UE for functional tasks.    Baseline 4/5   Time 6   Period Weeks   Status New               Plan - 05/13/16 1101    Clinical Impression Statement Pt demonstrates tendency to use her L UT muscle, needing consistent cues to decrease use, and improve utilization of lower and mid trap muscles with exercises. Difficulty performing supine cervical flexion with chin tuck without activating sternocleidomastoid. Pt tolerated session well without aggravation of symptoms.    Rehab Potential Fair   Clinical Impairments Affecting Rehab Potential Chronicity of condition   PT Frequency 2x / week   PT Duration 6 weeks   PT Treatment/Interventions  Therapeutic exercise;Manual techniques;Therapeutic activities;Patient/family education;Dry needling;Neuromuscular re-education   PT Next Visit Plan soft tissue mobilization, scapular strengthening, thoracic extension   Consulted and Agree with Plan of Care Patient      Patient will benefit from skilled therapeutic intervention in order to improve the following deficits and impairments:  Pain, Postural dysfunction, Improper body mechanics, Decreased range of motion  Visit Diagnosis: Cervicalgia  Radiculopathy, cervical region  Muscle weakness (generalized)     Problem List Patient Active Problem List   Diagnosis Date Noted  . Cervical pain (neck) 03/11/2016  . Lung nodule seen on imaging study 08/31/2015  . Palpitations 06/01/2015  . Panic attack as reaction to stress 06/01/2015  . Hypokalemia 08/02/2014  . Benign paroxysmal positional vertigo 08/02/2014  . DJD (degenerative joint disease) of cervical spine 02/17/2013  . HLD (hyperlipidemia) 11/08/2010  . Essential hypertension 11/08/2010  . ALLERGIC RHINITIS 11/08/2010    Joneen Boers PT, DPT   05/13/2016, 11:42 AM  [facility] [address] [place], [address] Phone: [phone]   Fax:  [phone]  Name: Robin Peck MRN: [id number] Date of Birth: 1946-08-08

## 2016-05-16 ENCOUNTER — Ambulatory Visit: Payer: 59

## 2016-05-16 DIAGNOSIS — M542 Cervicalgia: Secondary | ICD-10-CM | POA: Diagnosis not present

## 2016-05-16 DIAGNOSIS — M6281 Muscle weakness (generalized): Secondary | ICD-10-CM

## 2016-05-16 DIAGNOSIS — M5412 Radiculopathy, cervical region: Secondary | ICD-10-CM

## 2016-05-16 NOTE — Therapy (Signed)
[facility] [address] [place], [address] Phone: [phone]   Fax:  [phone]  Physical Therapy Treatment  Patient Details  Name: Robin Peck MRN: [id number] Date of Birth: Apr 27, 1946 Referring Provider: Mallie Mussel A. Pool, MD  Encounter Date: 05/16/2016      PT End of Session - 05/16/16 1031    Visit Number 4   Number of Visits 13   Date for PT Re-Evaluation 06/13/16   PT Start Time [id number]   PT Stop Time 1118   PT Time Calculation (min) 43 min   Activity Tolerance Patient tolerated treatment well   Behavior During Therapy WFL for tasks assessed/performed      Past Medical History:  Diagnosis Date  . Allergic rhinitis   . Hyperlipidemia   . Hypertension     Past Surgical History:  Procedure Laterality Date  . gyn surgery  2002   hysterectomy    There were no vitals filed for this visit.      Subjective Assessment - 05/16/16 1036    Subjective Neck is doing much better. Went to Dr. Annette Stable for a follow up yesterday which went well. 2-3/10 neck pain currently, more soreness than pain.  3-4/10 neck pain at most for the past 7 days.    Pertinent History Neck pain for over 20 years. 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 Neck pain progressed as years went on. Had chiropractic, accupuncture, and massage treatment which helped temporarily. Continued with massage therapy to help ease her pain. Took Advil which helps with her pain.  Changed to Tylenol to decrease risk of CVA or heart attack. Her pain however increase since Tylenol is not anti-inflammatory medication. Pt also has difficulty controlling her blood pressure due to increased pain. Pain elevates her blood pressure. Had an x-ray in her neck which revealed issues at her neck bones. Was referred to a neurologist. Had an MRI performed for her neck which revealed a bone encroachment in her neck and was referred to  PT.  Pt states that she can function normally but lifting, looking up, turning her head increases her neck pain.  When her neck bothers her, her fingers swell up as well.  Pain does not wake her up at night if her neck is positioned properly (uses flat pillows, R S/L ). Pt also states having radiating symptoms bilateral UE.  Bilateral hand swelling occurs when her neck bothers her.  Pt also states feeling relief from her neck when her massage therapist pulls on her neck but gets muscle bulges which takes 2 days to subside. Goal is also to avoid surgery.    Patient Stated Goals Turn her head better to look behind her when driving. Avoid surgery.   Currently in Pain? Yes   Pain Score 3   2-3/10   Pain Onset More than a month ago            [facility] PT Assessment - 05/16/16 1115      AROM   Cervical - Right Rotation 60   Cervical - Left Rotation 60                             PT Education - 05/16/16 1056    Education provided Yes   Education Details ther-ex   Person(s) Educated Patient   Methods Explanation;Demonstration;Tactile cues;Verbal cues   Comprehension Returned demonstration;Verbalized  understanding       Objectives  There-ex  Directed patient with seated L scapular retraction 10x5 seconds   Then with manual resistance targeting the lower traps 10x2 with 5 seconds   Standing bilateral scapular retraction resisting yellow band 10x3 to promote thoracic extension  Seated bilateral shoulder ER with chin tuck and scapular retraction 10x3  Supine open books (horizontal abduction) 10x3 with 5 second holds to promote thoracic extension  Supine chin tuck (with addition of bilateral scapular retraction) with cervical rotation 10x3 each side  Supine chin tuck with cervical flexion 5x3   Improved exercise technique, movement at target joints, use of target muscles after mod verbal, visual, tactile cues.    Patient making very good progress towards  overall decrease in neck pain. Improved L cervical rotation AROM compared to measurement at eval. Continue working on upper thoracic mobility, decreasing L UT muscle tension, increasing use of deep cervical muscles.                       PT Long Term Goals - 05/01/16 1924      PT LONG TERM GOAL #1   Title Patient will have a decrease in neck pain to 5/10 or less at worst to promote ability to turn her head as well as to look up, and lift objects.    Baseline 8/10 at worst   Time 6   Period Weeks   Status New     PT LONG TERM GOAL #2   Title Patient will improve L cervical rotation to at least 55 degrees without pain to promote ability to turn her head.   Baseline 45 degrees   Time 6   Period Weeks   Status New     PT LONG TERM GOAL #3   Title Patient will improve her Neck Disability Index score by at least 6% as a demonstration of improved function.    Baseline 22%   Time 6   Period Weeks   Status New     PT LONG TERM GOAL #4   Title Patient will improve L shoulder flexion, abduction, and L biceps flexion strength by at least 1/2 MMT grade to promote ability to use her L UE for functional tasks.    Baseline 4/5   Time 6   Period Weeks   Status New               Plan - 05/16/16 1056    Clinical Impression Statement Patient making very good progress towards overall decrease in neck pain. Improved L cervical rotation AROM compared to measurement at eval. Continue working on upper thoracic mobility, decreasing L UT muscle tension, increasing use of deep cervical muscles.    Rehab Potential Fair   Clinical Impairments Affecting Rehab Potential Chronicity of condition   PT Frequency 2x / week   PT Duration 6 weeks   PT Treatment/Interventions Therapeutic exercise;Manual techniques;Therapeutic activities;Patient/family education;Dry needling;Neuromuscular re-education   PT Next Visit Plan soft tissue mobilization, scapular strengthening, thoracic  extension   Consulted and Agree with Plan of Care Patient      Patient will benefit from skilled therapeutic intervention in order to improve the following deficits and impairments:  Pain, Postural dysfunction, Improper body mechanics, Decreased range of motion  Visit Diagnosis: Cervicalgia  Radiculopathy, cervical region  Muscle weakness (generalized)     Problem List Patient Active Problem List   Diagnosis Date Noted  . Cervical pain (neck) 03/11/2016  . Lung nodule  seen on imaging study 08/31/2015  . Palpitations 06/01/2015  . Panic attack as reaction to stress 06/01/2015  . Hypokalemia 08/02/2014  . Benign paroxysmal positional vertigo 08/02/2014  . DJD (degenerative joint disease) of cervical spine 02/17/2013  . HLD (hyperlipidemia) 11/08/2010  . Essential hypertension 11/08/2010  . ALLERGIC RHINITIS 11/08/2010    Joneen Boers PT, DPT   05/16/2016, 7:54 PM  [facility] [address] [place], [address] Phone: [phone]   Fax:  [phone]  Name: MERCURY SCHULMEISTER MRN: [id number] Date of Birth: Jun 08, 1946

## 2016-05-19 ENCOUNTER — Other Ambulatory Visit: Payer: Self-pay | Admitting: Family Medicine

## 2016-05-21 ENCOUNTER — Ambulatory Visit: Payer: 59

## 2016-05-21 DIAGNOSIS — M6281 Muscle weakness (generalized): Secondary | ICD-10-CM

## 2016-05-21 DIAGNOSIS — M5412 Radiculopathy, cervical region: Secondary | ICD-10-CM

## 2016-05-21 DIAGNOSIS — M542 Cervicalgia: Secondary | ICD-10-CM

## 2016-05-21 NOTE — Therapy (Signed)
[facility] [address], [place], [address] Phone: [phone]   Fax:  [phone]  Physical Therapy Treatment  Patient Details  Name: Robin Peck MRN: [id number] Date of Birth: Feb 22, 1946 Referring Provider: Mallie Mussel A. Pool, MD  Encounter Date: 05/21/2016      PT End of Session - 05/21/16 1120    Visit Number 5   Number of Visits 13   Date for PT Re-Evaluation 06/13/16   PT Start Time 1120   PT Stop Time 1211   PT Time Calculation (min) 51 min   Activity Tolerance Patient tolerated treatment well   Behavior During Therapy WFL for tasks assessed/performed      Past Medical History:  Diagnosis Date  . Allergic rhinitis   . Hyperlipidemia   . Hypertension     Past Surgical History:  Procedure Laterality Date  . gyn surgery  2002   hysterectomy    There were no vitals filed for this visit.      Subjective Assessment - 05/21/16 1121    Subjective Neck is doing better. 2-3/10 neck pain currently. Its not something that bothers me really bad. Pt states that since starting PT, she has not been dependent on her pain medication.    Pertinent History Neck pain for over 20 years. 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 Neck pain progressed as years went on. Had chiropractic, accupuncture, and massage treatment which helped temporarily. Continued with massage therapy to help ease her pain. Took Advil which helps with her pain.  Changed to Tylenol to decrease risk of CVA or heart attack. Her pain however increase since Tylenol is not anti-inflammatory medication. Pt also has difficulty controlling her blood pressure due to increased pain. Pain elevates her blood pressure. Had an x-ray in her neck which revealed issues at her neck bones. Was referred to a neurologist. Had an MRI performed for her neck which revealed a bone encroachment in her neck and was referred to PT.  Pt  states that she can function normally but lifting, looking up, turning her head increases her neck pain.  When her neck bothers her, her fingers swell up as well.  Pain does not wake her up at night if her neck is positioned properly (uses flat pillows, R S/L ). Pt also states having radiating symptoms bilateral UE.  Bilateral hand swelling occurs when her neck bothers her.  Pt also states feeling relief from her neck when her massage therapist pulls on her neck but gets muscle bulges which takes 2 days to subside. Goal is also to avoid surgery.    Patient Stated Goals Turn her head better to look behind her when driving. Avoid surgery.   Currently in Pain? Yes   Pain Score 3   2-3/10   Pain Onset More than a month ago                            Objectives   Reviewed TENS and insurance may or may not cover it, as well as how to use it. Pt to check to see if her insurance covers unit. Pt verbalized understanding. Over the counter option suggested if insurance does not cover the unit.    TENS: constant, biphasic for 15 min.   Channel 1 at base of neck level 5 to 6  Channel 2: posterior bilateral scapular area level 5  Neck pain: 0/10 while the machine is on. No neck pain after TENS.     There-ex  Directed patient with chin tucks, seated bilateral scapular retraction with gentle cervical extension 10x3 (to promote upper thoracic extension).  Difficulty with maintaining posture but improved after cues. Upper thoracic extension observed when performed properly   seated L scapular retraction with manual resistance targeting the lower trap muscles 10x5 seconds for 3 sets    Standing bilateral shoulder rows with scapular retraction resisting yellow band 10x to promote thoracic extension    Standing bilateral shoulder ER with scapular retraction resisting yellow band 10x2 to promote thoracic extension.    Improved exercise technique, movement at target joints,  use of target muscles after mod verbal, visual, tactile cues.      Improving ability to activate lower trap muscles, and improving upper thoracic extension. No complain of neck pain after using the TENS unit.         PT Education - 05/21/16 1141    Education provided Yes   Education Details ther-ex   [employer]) Educated Patient   Methods Explanation;Demonstration;Tactile cues;Verbal cues   Comprehension Returned demonstration;Verbalized understanding             PT Long Term Goals - 05/01/16 1924      PT LONG TERM GOAL #1   Title Patient will have a decrease in neck pain to 5/10 or less at worst to promote ability to turn her head as well as to look up, and lift objects.    Baseline 8/10 at worst   Time 6   Period Weeks   Status New     PT LONG TERM GOAL #2   Title Patient will improve L cervical rotation to at least 55 degrees without pain to promote ability to turn her head.   Baseline 45 degrees   Time 6   Period Weeks   Status New     PT LONG TERM GOAL #3   Title Patient will improve her Neck Disability Index score by at least 6% as a demonstration of improved function.    Baseline 22%   Time 6   Period Weeks   Status New     PT LONG TERM GOAL #4   Title Patient will improve L shoulder flexion, abduction, and L biceps flexion strength by at least 1/2 MMT grade to promote ability to use her L UE for functional tasks.    Baseline 4/5   Time 6   Period Weeks   Status New               Plan - 05/21/16 1119    Clinical Impression Statement Improving ability to activate lower trap muscles, and improving upper thoracic extension. No complain of neck pain after using the TENS unit.    Rehab Potential Fair   Clinical Impairments Affecting Rehab Potential Chronicity of condition   PT Frequency 2x / week   PT Duration 6 weeks   PT Treatment/Interventions Therapeutic exercise;Manual techniques;Therapeutic activities;Patient/family education;Dry  needling;Neuromuscular re-education   PT Next Visit Plan soft tissue mobilization, scapular strengthening, thoracic extension   Consulted and Agree with Plan of Care Patient      Patient will benefit from skilled therapeutic intervention in order to improve the following deficits and impairments:  Pain, Postural dysfunction, Improper body mechanics, Decreased range of motion  Visit Diagnosis: Cervicalgia  Radiculopathy, cervical region  Muscle weakness (generalized)     Problem List Patient Active Problem List   Diagnosis  Date Noted  . Cervical pain (neck) 03/11/2016  . Lung nodule seen on imaging study 08/31/2015  . Palpitations 06/01/2015  . Panic attack as reaction to stress 06/01/2015  . Hypokalemia 08/02/2014  . Benign paroxysmal positional vertigo 08/02/2014  . DJD (degenerative joint disease) of cervical spine 02/17/2013  . HLD (hyperlipidemia) 11/08/2010  . Essential hypertension 11/08/2010  . ALLERGIC RHINITIS 11/08/2010    Joneen Boers PT, DPT   05/21/2016, 12:44 PM  [facility] [address] [place], [address] Phone: [phone]   Fax:  [phone]  Name: Robin Peck MRN: [id number] Date of Birth: [birth year]

## 2016-05-23 ENCOUNTER — Ambulatory Visit: Payer: 59

## 2016-05-23 DIAGNOSIS — M542 Cervicalgia: Secondary | ICD-10-CM | POA: Diagnosis not present

## 2016-05-23 DIAGNOSIS — M5412 Radiculopathy, cervical region: Secondary | ICD-10-CM

## 2016-05-23 DIAGNOSIS — M6281 Muscle weakness (generalized): Secondary | ICD-10-CM

## 2016-05-23 NOTE — Therapy (Signed)
[facility] [address], [place], [address] Phone: [phone]   Fax:  [phone]  Physical Therapy Treatment  Patient Details  Name: Robin Peck MRN: [id number] Date of Birth: 1945-12-04 Referring Provider: Mallie Mussel A. Pool, MD  Encounter Date: 05/23/2016      PT End of Session - 05/23/16 1117    Visit Number 6   Number of Visits 13   Date for PT Re-Evaluation 06/13/16   PT Start Time 1117   PT Stop Time 1205   PT Time Calculation (min) 48 min   Activity Tolerance Patient tolerated treatment well   Behavior During Therapy [facility] for tasks assessed/performed      Past Medical History:  Diagnosis Date  . Allergic rhinitis   . Hyperlipidemia   . Hypertension     Past Surgical History:  Procedure Laterality Date  . gyn surgery  2002   hysterectomy    There were no vitals filed for this visit.      Subjective Assessment - 05/23/16 1118    Subjective Pt states having pain and stiffness today (R posterior neck to UT area). Still waiting for the insurance company to call them back for the TENS unit information.  4-5/10 neck pain currently. 5-6/10 neck pain at worst for the past 7 days.  Pt states that she was driving for about an hour to visit her brother in law. Felt stiffness and ache after she got home from driving.  Pt also states that cleaning windows (raising her arm) bothers her neck the next day.    Pertinent History Neck pain for over 20 years. 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 Neck pain progressed as years went on. Had chiropractic, accupuncture, and massage treatment which helped temporarily. Continued with massage therapy to help ease her pain. Took Advil which helps with her pain.  Changed to Tylenol to decrease risk of CVA or heart attack. Her pain however increase since Tylenol is not anti-inflammatory medication. Pt also has difficulty controlling  her blood pressure due to increased pain. Pain elevates her blood pressure. Had an x-ray in her neck which revealed issues at her neck bones. Was referred to a neurologist. Had an MRI performed for her neck which revealed a bone encroachment in her neck and was referred to PT.  Pt states that she can function normally but lifting, looking up, turning her head increases her neck pain.  When her neck bothers her, her fingers swell up as well.  Pain does not wake her up at night if her neck is positioned properly (uses flat pillows, R S/L ). Pt also states having radiating symptoms bilateral UE.  Bilateral hand swelling occurs when her neck bothers her.  Pt also states feeling relief from her neck when her massage therapist pulls on her neck but gets muscle bulges which takes 2 days to subside. Goal is also to avoid surgery.    Patient Stated Goals Turn her head better to look behind her when driving. Avoid surgery.   Pain Score 5    Pain Onset More than a month ago            [facility] PT Assessment - 05/23/16 0001      Observation/Other Assessments   Neck Disability Index  32%  Pt however had a set back the other day. See subjective  PT Education - 05/23/16 1901    Education provided Yes   Education Details ther-ex, HEP   Person(s) Educated Patient   Methods Explanation;Tactile cues;Demonstration;Verbal cues;Handout   Comprehension Verbalized understanding;Returned demonstration      Objectives    There-ex  Directed patient with towel slides up the wall for flexion, emphasis on no shoulder shrug 10x3 each UE  to simulate cleaning her windows  Seated manually resisted L scapular retraction targeting the lower trap muscles. Decreased L UT stiffness per pt. 10x2 with 5 second holds   Seated bilateral scapular retraction with bilateral shoulder flexion 10x3 to simulate proper scapular positioning (to decreased shoulder shrug) while driving.    Supine bilateral scapular protraction with arms at 90 degrees flexion 10x3, empasis on not shrugging shoulder. Performed to promote serratus anterior strength and improve scapular retraction ability with decreased winging.   Supine open books 10x10 seconds to promote pectoralis stretch   Seated chin tucks 10x 5 seconds   Improved exercise technique, movement at target joints, use of target muscles after mod verbal, visual, tactile cues.     Slight increase in tightness but slight decrease in neck ache after session. Cues needed to decrease UT muscle activation with shoulder flexion.             PT Long Term Goals - 05/01/16 1924      PT LONG TERM GOAL #1   Title Patient will have a decrease in neck pain to 5/10 or less at worst to promote ability to turn her head as well as to look up, and lift objects.    Baseline 8/10 at worst   Time 6   Period Weeks   Status New     PT LONG TERM GOAL #2   Title Patient will improve L cervical rotation to at least 55 degrees without pain to promote ability to turn her head.   Baseline 45 degrees   Time 6   Period Weeks   Status New     PT LONG TERM GOAL #3   Title Patient will improve her Neck Disability Index score by at least 6% as a demonstration of improved function.    Baseline 22%   Time 6   Period Weeks   Status New     PT LONG TERM GOAL #4   Title Patient will improve L shoulder flexion, abduction, and L biceps flexion strength by at least 1/2 MMT grade to promote ability to use her L UE for functional tasks.    Baseline 4/5   Time 6   Period Weeks   Status New               Plan - 05/23/16 1901    Clinical Impression Statement Slight increase in tightness but slight decrease in neck ache after session. Cues needed to decrease UT muscle activation with shoulder flexion.    Rehab Potential Fair   Clinical Impairments Affecting Rehab Potential Chronicity of condition   PT Frequency 2x / week   PT Duration 6  weeks   PT Treatment/Interventions Therapeutic exercise;Manual techniques;Therapeutic activities;Patient/family education;Dry needling;Neuromuscular re-education   PT Next Visit Plan soft tissue mobilization, scapular strengthening, thoracic extension   Consulted and Agree with Plan of Care Patient      Patient will benefit from skilled therapeutic intervention in order to improve the following deficits and impairments:  Pain, Postural dysfunction, Improper body mechanics, Decreased range of motion  Visit Diagnosis: Cervicalgia  Radiculopathy, cervical region  Muscle weakness (generalized)  Problem List Patient Active Problem List   Diagnosis Date Noted  . Cervical pain (neck) 03/11/2016  . Lung nodule seen on imaging study 08/31/2015  . Palpitations 06/01/2015  . Panic attack as reaction to stress 06/01/2015  . Hypokalemia 08/02/2014  . Benign paroxysmal positional vertigo 08/02/2014  . DJD (degenerative joint disease) of cervical spine 02/17/2013  . HLD (hyperlipidemia) 11/08/2010  . Essential hypertension 11/08/2010  . ALLERGIC RHINITIS 11/08/2010    Joneen Boers PT, DPT   05/23/2016, 7:10 PM  [place] [place] [facility] [address], [place], [address] Phone: [phone]   Fax:  [phone]  Name: BELLANY ANDERMAN MRN: [id number] Date of Birth: 07-25-1946

## 2016-05-23 NOTE — Patient Instructions (Signed)
   Keep your shoulder blades squeezed:    Raise both arms up.    Do not let your shoulders shrug   Repeat 10 times.    Perform 3 sets daily.

## 2016-05-27 ENCOUNTER — Ambulatory Visit: Payer: 59

## 2016-05-27 DIAGNOSIS — M6281 Muscle weakness (generalized): Secondary | ICD-10-CM

## 2016-05-27 DIAGNOSIS — M542 Cervicalgia: Secondary | ICD-10-CM

## 2016-05-27 DIAGNOSIS — M5412 Radiculopathy, cervical region: Secondary | ICD-10-CM

## 2016-05-27 NOTE — Patient Instructions (Signed)
(  Home) Extension: Thoracic With Lumbar Lock - Sitting     Keep your chin tucked.   Breathe!   Extend trunk over chair back. Hold position for __5__ seconds.  Repeat ___10  times per set. Do _3___ sets per session daily.   Copyright  VHI. All rights reserved.

## 2016-05-27 NOTE — Therapy (Signed)
[facility] [place] [facility] [address], [place], [address] Phone: [phone]   Fax:  [phone]  Physical Therapy Treatment  Patient Details  Name: Robin Peck MRN: [id number] Date of Birth: [birth year] Referring Provider: Mallie Mussel A. Pool, MD  Encounter Date: 05/27/2016      PT End of Session - 05/27/16 1450    Visit Number 7   Number of Visits 13   Date for PT Re-Evaluation 06/13/16   PT Start Time 1450   PT Stop Time 1532   PT Time Calculation (min) 42 min   Activity Tolerance Patient tolerated treatment well   Behavior During Therapy [facility] for tasks assessed/performed      Past Medical History:  Diagnosis Date  . Allergic rhinitis   . Hyperlipidemia   . Hypertension     Past Surgical History:  Procedure Laterality Date  . gyn surgery  2002   hysterectomy    There were no vitals filed for this visit.      Subjective Assessment - 05/27/16 1451    Subjective Neck is a little bit better than last week. Still has some stiffness and mild pain. I feel a lot better than I was. Sometimes I have pain, other days I don't have pain.  Pt adds mopping the other day and tried using her lower scapular muscles which helped her neck.  The muscle (L UT) is not as tight anymore.    Pertinent History Neck pain for over 20 years. 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 Neck pain progressed as years went on. Had chiropractic, accupuncture, and massage treatment which helped temporarily. Continued with massage therapy to help ease her pain. Took Advil which helps with her pain.  Changed to Tylenol to decrease risk of CVA or heart attack. Her pain however increase since Tylenol is not anti-inflammatory medication. Pt also has difficulty controlling her blood pressure due to increased pain. Pain elevates her blood pressure. Had an x-ray in her neck which revealed issues at her neck bones. Was  referred to a neurologist. Had an MRI performed for her neck which revealed a bone encroachment in her neck and was referred to PT.  Pt states that she can function normally but lifting, looking up, turning her head increases her neck pain.  When her neck bothers her, her fingers swell up as well.  Pain does not wake her up at night if her neck is positioned properly (uses flat pillows, R S/L ). Pt also states having radiating symptoms bilateral UE.  Bilateral hand swelling occurs when her neck bothers her.  Pt also states feeling relief from her neck when her massage therapist pulls on her neck but gets muscle bulges which takes 2 days to subside. Goal is also to avoid surgery.    Patient Stated Goals Turn her head better to look behind her when driving. Avoid surgery.   Currently in Pain? Yes   Pain Score 4    Pain Onset More than a month ago               Objectives    There-ex  Directed patient with seated bilateral scapular retraction with bilateral shoulder flexion 10x3 to simulate proper scapular positioning (to decreased shoulder shrug) while driving.   Better control since last session but still needed cues  Seated manually resisted L scapular retraction targeting the lower trap muscles. 5x10 seconds for 3 sets  towel slides up the wall for flexion, emphasis on no shoulder shrug 10x2 each UE  Seated thoracic extension over chair with chin tuck 10x5 seconds for 3 sets to promote thoracic extension.   Supine bilateral serratus reach starting with arms at 90 degrees flexion 10x5 seconds for 2 sets. Improved scapular control.   Supine open books 10x10 seconds to promote pectoralis stretch   Seated bilateral shoulder ER with scapular retraction 10x3 resisting yellow band.     Improved exercise technique, movement at target joints, use of target muscles after mod verbal, visual, tactile cues.      Improved L scapular control when raising L UE compared to previous  sessions with decreased activation of L UT muscle. Pt states no neck pain after session.                              PT Education - 05/27/16 1458    Education provided Yes   Education Details ther-ex, HEP   Person(s) Educated Patient   Methods Explanation;Demonstration;Tactile cues;Verbal cues;Handout   Comprehension Verbalized understanding;Returned demonstration             PT Long Term Goals - 05/01/16 1924      PT LONG TERM GOAL #1   Title Patient will have a decrease in neck pain to 5/10 or less at worst to promote ability to turn her head as well as to look up, and lift objects.    Baseline 8/10 at worst   Time 6   Period Weeks   Status New     PT LONG TERM GOAL #2   Title Patient will improve L cervical rotation to at least 55 degrees without pain to promote ability to turn her head.   Baseline 45 degrees   Time 6   Period Weeks   Status New     PT LONG TERM GOAL #3   Title Patient will improve her Neck Disability Index score by at least 6% as a demonstration of improved function.    Baseline 22%   Time 6   Period Weeks   Status New     PT LONG TERM GOAL #4   Title Patient will improve L shoulder flexion, abduction, and L biceps flexion strength by at least 1/2 MMT grade to promote ability to use her L UE for functional tasks.    Baseline 4/5   Time 6   Period Weeks   Status New               Plan - 05/27/16 1458    Clinical Impression Statement Improved L scapular control when raising L UE compared to previous sessions with decreased activation of L UT muscle. Pt states no neck pain after session.    Rehab Potential Fair   Clinical Impairments Affecting Rehab Potential Chronicity of condition   PT Frequency 2x / week   PT Duration 6 weeks   PT Treatment/Interventions Therapeutic exercise;Manual techniques;Therapeutic activities;Patient/family education;Dry needling;Neuromuscular re-education   PT Next Visit Plan soft  tissue mobilization, scapular strengthening, thoracic extension   Consulted and Agree with Plan of Care Patient      Patient will benefit from skilled therapeutic intervention in order to improve the following deficits and impairments:  Pain, Postural dysfunction, Improper body mechanics, Decreased range of motion  Visit Diagnosis: Cervicalgia  Radiculopathy, cervical region  Muscle weakness (generalized)     Problem List Patient Active Problem List   Diagnosis  Date Noted  . Cervical pain (neck) 03/11/2016  . Lung nodule seen on imaging study 08/31/2015  . Palpitations 06/01/2015  . Panic attack as reaction to stress 06/01/2015  . Hypokalemia 08/02/2014  . Benign paroxysmal positional vertigo 08/02/2014  . DJD (degenerative joint disease) of cervical spine 02/17/2013  . HLD (hyperlipidemia) 11/08/2010  . Essential hypertension 11/08/2010  . ALLERGIC RHINITIS 11/08/2010     Joneen Boers PT, DPT   05/27/2016, 3:52 PM  [facility] [address] [place], [address] Phone: [phone]   Fax:  [phone]  Name: ISHANA FOSNAUGH MRN: [id number] Date of Birth: [birth year]

## 2016-05-29 ENCOUNTER — Ambulatory Visit: Payer: 59

## 2016-05-29 DIAGNOSIS — M542 Cervicalgia: Secondary | ICD-10-CM

## 2016-05-29 DIAGNOSIS — M6281 Muscle weakness (generalized): Secondary | ICD-10-CM

## 2016-05-29 DIAGNOSIS — M5412 Radiculopathy, cervical region: Secondary | ICD-10-CM

## 2016-05-29 NOTE — Therapy (Signed)
[place] [address], [place], [address] Phone: [phone]   Fax:  [phone]  Physical Therapy Treatment  Patient Details  Name: Robin Peck MRN: [id number] Date of Birth: June 10, 1946 Referring Provider: Mallie Mussel A. Pool, MD  Encounter Date: 05/29/2016      PT End of Session - 05/29/16 1450    Visit Number 8   Number of Visits 13   Date for PT Re-Evaluation 06/13/16   PT Start Time 1450   PT Stop Time 1531   PT Time Calculation (min) 41 min   Activity Tolerance Patient tolerated treatment well   Behavior During Therapy [facility] for tasks assessed/performed      Past Medical History:  Diagnosis Date  . Allergic rhinitis   . Hyperlipidemia   . Hypertension     Past Surgical History:  Procedure Laterality Date  . gyn surgery  2002   hysterectomy    There were no vitals filed for this visit.      Subjective Assessment - 05/29/16 1452    Subjective Pt states feeling good. No neck pain but a little sore but nothing much really that bothers me. I feel like a normal human being.    Pertinent History Neck pain for over 20 years. 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 Neck pain progressed as years went on. Had chiropractic, accupuncture, and massage treatment which helped temporarily. Continued with massage therapy to help ease her pain. Took Advil which helps with her pain.  Changed to Tylenol to decrease risk of CVA or heart attack. Her pain however increase since Tylenol is not anti-inflammatory medication. Pt also has difficulty controlling her blood pressure due to increased pain. Pain elevates her blood pressure. Had an x-ray in her neck which revealed issues at her neck bones. Was referred to a neurologist. Had an MRI performed for her neck which revealed a bone encroachment in her neck and was referred to PT.  Pt states that she can function normally but lifting,  looking up, turning her head increases her neck pain.  When her neck bothers her, her fingers swell up as well.  Pain does not wake her up at night if her neck is positioned properly (uses flat pillows, R S/L ). Pt also states having radiating symptoms bilateral UE.  Bilateral hand swelling occurs when her neck bothers her.  Pt also states feeling relief from her neck when her massage therapist pulls on her neck but gets muscle bulges which takes 2 days to subside. Goal is also to avoid surgery.    Patient Stated Goals Turn her head better to look behind her when driving. Avoid surgery.   Currently in Pain? No/denies   Pain Onset More than a month ago            [facility] PT Assessment - 05/29/16 1511      AROM   Cervical - Right Rotation 60   Cervical - Left Rotation 65                             PT Education - 05/29/16 1507    Education provided Yes   Education Details ther-ex   Person(s) Educated Patient   Methods Explanation;Demonstration;Tactile cues;Verbal cues   Comprehension Returned demonstration;Verbalized understanding     Objectives    There-ex  Directed patient with seated manually resisted L scapular  retraction targeting the lower trap muscles. 10x10 seconds for 2 sets   Seated thoracic extension over chair with chin tuck 10x5 seconds for 2 sets to promote thoracic extension.   L and R cervical rotation  1-2x each seated   Reviewed progress/current status with cervical rotation with pt.    seated bilateral scapular retraction with bilateral shoulder flexion to 90 degrees 10x2   towel slides up the wall for flexion, emphasis on no shoulder shrug 10x2 each UE  Seated bilateral shoulder ER with scapular retraction 10x3 resisting yellow band.   L UT muscle stretch 15 seconds x 3  Standing bilateral mid rows resisting yellow band 10x2   Improved exercise technique, movement at target joints, use of target muscles after mod verbal,  visual, tactile cues.     Improved L cervical rotation by 20 degrees compared to initial evaluation. Demonstrates slight shoulder shrug L > R when raising her arms but much improved compared to previous sessions. Pt making very good progress with PT towards decreased neck pain and improved ability to turn her head.          PT Long Term Goals - 05/01/16 1924      PT LONG TERM GOAL #1   Title Patient will have a decrease in neck pain to 5/10 or less at worst to promote ability to turn her head as well as to look up, and lift objects.    Baseline 8/10 at worst   Time 6   Period Weeks   Status New     PT LONG TERM GOAL #2   Title Patient will improve L cervical rotation to at least 55 degrees without pain to promote ability to turn her head.   Baseline 45 degrees   Time 6   Period Weeks   Status New     PT LONG TERM GOAL #3   Title Patient will improve her Neck Disability Index score by at least 6% as a demonstration of improved function.    Baseline 22%   Time 6   Period Weeks   Status New     PT LONG TERM GOAL #4   Title Patient will improve L shoulder flexion, abduction, and L biceps flexion strength by at least 1/2 MMT grade to promote ability to use her L UE for functional tasks.    Baseline 4/5   Time 6   Period Weeks   Status New               Plan - 05/29/16 1508    Clinical Impression Statement Improved L cervical rotation by 20 degrees compared to initial evaluation. Demonstrates slight shoulder shrug L > R when raising her arms but much improved compared to previous sessions. Pt making very good progress with PT towards decreased neck pain and improved ability to turn her head.    Rehab Potential Fair   Clinical Impairments Affecting Rehab Potential Chronicity of condition   PT Frequency 2x / week   PT Duration 6 weeks   PT Treatment/Interventions Therapeutic exercise;Manual techniques;Therapeutic activities;Patient/family education;Dry  needling;Neuromuscular re-education   PT Next Visit Plan soft tissue mobilization, scapular strengthening, thoracic extension   Consulted and Agree with Plan of Care Patient      Patient will benefit from skilled therapeutic intervention in order to improve the following deficits and impairments:  Pain, Postural dysfunction, Improper body mechanics, Decreased range of motion  Visit Diagnosis: Cervicalgia  Radiculopathy, cervical region  Muscle weakness (generalized)  Problem List Patient Active Problem List   Diagnosis Date Noted  . Cervical pain (neck) 03/11/2016  . Lung nodule seen on imaging study 08/31/2015  . Palpitations 06/01/2015  . Panic attack as reaction to stress 06/01/2015  . Hypokalemia 08/02/2014  . Benign paroxysmal positional vertigo 08/02/2014  . DJD (degenerative joint disease) of cervical spine 02/17/2013  . HLD (hyperlipidemia) 11/08/2010  . Essential hypertension 11/08/2010  . ALLERGIC RHINITIS 11/08/2010    Joneen Boers PT, DPT   05/29/2016, 3:37 PM  [place] [address], [place], [address] Phone: [phone]   Fax:  [phone]  Name: JANICA STEPANSKI MRN: [id number] Date of Birth: September 04, 1945

## 2016-06-03 ENCOUNTER — Ambulatory Visit: Payer: Commercial Managed Care - HMO | Attending: Neurosurgery

## 2016-06-03 DIAGNOSIS — M5412 Radiculopathy, cervical region: Secondary | ICD-10-CM | POA: Diagnosis present

## 2016-06-03 DIAGNOSIS — M6281 Muscle weakness (generalized): Secondary | ICD-10-CM | POA: Insufficient documentation

## 2016-06-03 DIAGNOSIS — M542 Cervicalgia: Secondary | ICD-10-CM | POA: Diagnosis not present

## 2016-06-03 NOTE — Patient Instructions (Signed)
Strengthening: Resisted Extension    Hold yellow band one on each hand, arm forward. Pull arm back to neutral, elbow straight, shoulders squeezed.  Hold for 5 seconds Repeat ___10_ times per set. Do _3___ sets per session. Do _1__ sessions per day.  http://orth.exer.[email or web address]   Copyright  VHI. All rights reserved.

## 2016-06-03 NOTE — Therapy (Signed)
[facility] [address] [place], [address] Phone: [phone]   Fax:  [phone]  Physical Therapy Treatment  Patient Details  Name: Robin Peck MRN: [id number] Date of Birth: 1945/11/25 Referring Provider: Mallie Mussel A. Pool, MD  Encounter Date: 06/03/2016      PT End of Session - 06/03/16 1345    Visit Number 9   Number of Visits 13   Date for PT Re-Evaluation 06/13/16   PT Start Time [id number]   PT Stop Time 1430   PT Time Calculation (min) 45 min   Activity Tolerance Patient tolerated treatment well   Behavior During Therapy [facility] for tasks assessed/performed      Past Medical History:  Diagnosis Date  . Allergic rhinitis   . Hyperlipidemia   . Hypertension     Past Surgical History:  Procedure Laterality Date  . gyn surgery  2002   hysterectomy    There were no vitals filed for this visit.      Subjective Assessment - 06/03/16 1347    Subjective Pt states not feeling too great. Feels a heaviness and weight on her shoulder from the back of her neck to the back of her shoulders. Not really having pain but feels like a weight since yesterday afternoon. Does not know what causes it. Did not do hardly anything yesterday. Somedays she feels better, other days, she feels different.    Pertinent History Neck pain for over 20 years. 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 Neck pain progressed as years went on. Had chiropractic, accupuncture, and massage treatment which helped temporarily. Continued with massage therapy to help ease her pain. Took Advil which helps with her pain.  Changed to Tylenol to decrease risk of CVA or heart attack. Her pain however increase since Tylenol is not anti-inflammatory medication. Pt also has difficulty controlling her blood pressure due to increased pain. Pain elevates her blood pressure. Had an x-ray in her neck which revealed issues at  her neck bones. Was referred to a neurologist. Had an MRI performed for her neck which revealed a bone encroachment in her neck and was referred to PT.  Pt states that she can function normally but lifting, looking up, turning her head increases her neck pain.  When her neck bothers her, her fingers swell up as well.  Pain does not wake her up at night if her neck is positioned properly (uses flat pillows, R S/L ). Pt also states having radiating symptoms bilateral UE.  Bilateral hand swelling occurs when her neck bothers her.  Pt also states feeling relief from her neck when her massage therapist pulls on her neck but gets muscle bulges which takes 2 days to subside. Goal is also to avoid surgery.    Patient Stated Goals Turn her head better to look behind her when driving. Avoid surgery.   Currently in Pain? Other (Comment)   Pain Score --  no pain level provided. Feels heaviness   Pain Onset More than a month ago            [facility] PT Assessment - 06/03/16 1411      Assessment   Next MD Visit July 18, 2016                             PT Education - 06/03/16 1414    Education provided  Yes   Education Details ther-ex, HEP   Person(s) Educated Patient   Methods Explanation;Demonstration;Tactile cues;Verbal cues;Handout   Comprehension Returned demonstration;Verbalized understanding       Objectives    There-ex  Directed patient with L and R UT stretch 10 seconds x 5.    seated manually resisted L and R scapular retraction targeting the lower trap muscles. 5x10 seconds alternating sides for 2 sets. Decreased tightness and heaviness feeling  Standing bilateral shoulder extension to neutral resisting yellow band 10x3. Good lower trap muscle use felt. Decreased heaviness feeling.   Reviewed and given as part of her HEP. Pt demonstrated and verbalized understanding.   Supine deep cervical flexion 5x3. Cues to maintain chin tuck.   seated thoracic  extension over chair 10x2 with 5 seconds to promote thoracic extension.    Improved exercise technique, movement at target joints, use of target muscles after min to mod verbal, visual, tactile cues.     Decreased heaviness feeling at her neck and shoulders after exercises promoting increased lower trap muscle use (to decrease UT muscle tension), activating deep cervical flexor muscles, and increasing thoracic extension.             PT Long Term Goals - 05/01/16 1924      PT LONG TERM GOAL #1   Title Patient will have a decrease in neck pain to 5/10 or less at worst to promote ability to turn her head as well as to look up, and lift objects.    Baseline 8/10 at worst   Time 6   Period Weeks   Status New     PT LONG TERM GOAL #2   Title Patient will improve L cervical rotation to at least 55 degrees without pain to promote ability to turn her head.   Baseline 45 degrees   Time 6   Period Weeks   Status New     PT LONG TERM GOAL #3   Title Patient will improve her Neck Disability Index score by at least 6% as a demonstration of improved function.    Baseline 22%   Time 6   Period Weeks   Status New     PT LONG TERM GOAL #4   Title Patient will improve L shoulder flexion, abduction, and L biceps flexion strength by at least 1/2 MMT grade to promote ability to use her L UE for functional tasks.    Baseline 4/5   Time 6   Period Weeks   Status New               Plan - 06/03/16 1409    Clinical Impression Statement Decreased heaviness feeling after exercises promoting increased lower trap muscle use (to decrease UT muscle tension), activating deep cervical flexor muscles, and increasing thoracic extension.    Rehab Potential Fair   Clinical Impairments Affecting Rehab Potential Chronicity of condition   PT Frequency 2x / week   PT Duration 6 weeks   PT Treatment/Interventions Therapeutic exercise;Manual techniques;Therapeutic activities;Patient/family  education;Dry needling;Neuromuscular re-education   PT Next Visit Plan soft tissue mobilization, scapular strengthening, thoracic extension   Consulted and Agree with Plan of Care Patient      Patient will benefit from skilled therapeutic intervention in order to improve the following deficits and impairments:  Pain, Postural dysfunction, Improper body mechanics, Decreased range of motion  Visit Diagnosis: Cervicalgia  Radiculopathy, cervical region  Muscle weakness (generalized)     Problem List Patient Active Problem List   Diagnosis  Date Noted  . Cervical pain (neck) 03/11/2016  . Lung nodule seen on imaging study 08/31/2015  . Palpitations 06/01/2015  . Panic attack as reaction to stress 06/01/2015  . Hypokalemia 08/02/2014  . Benign paroxysmal positional vertigo 08/02/2014  . DJD (degenerative joint disease) of cervical spine 02/17/2013  . HLD (hyperlipidemia) 11/08/2010  . Essential hypertension 11/08/2010  . ALLERGIC RHINITIS 11/08/2010    Joneen Boers PT, DPT   06/03/2016, 6:08 PM  [facility] [address], [place], [address] Phone: [phone]   Fax:  [phone]  Name: ZURIYA ZARZA MRN: [id number] Date of Birth: 1946-05-31

## 2016-06-05 ENCOUNTER — Ambulatory Visit: Payer: Commercial Managed Care - HMO

## 2016-06-05 DIAGNOSIS — M542 Cervicalgia: Secondary | ICD-10-CM

## 2016-06-05 DIAGNOSIS — M6281 Muscle weakness (generalized): Secondary | ICD-10-CM

## 2016-06-05 DIAGNOSIS — M5412 Radiculopathy, cervical region: Secondary | ICD-10-CM

## 2016-06-05 NOTE — Therapy (Signed)
[facility] [address] [place], [address] Phone: [phone]   Fax:  [phone]  Physical Therapy Treatment  Patient Details  Name: Robin Peck MRN: [id number] Date of Birth: 1945/10/20 Referring Provider: Mallie Mussel A. Pool, MD  Encounter Date: 06/05/2016      PT End of Session - 06/05/16 1346    Visit Number 10   Number of Visits 13   Date for PT Re-Evaluation 06/13/16   PT Start Time 1346   PT Stop Time 1430   PT Time Calculation (min) 44 min   Activity Tolerance Patient tolerated treatment well   Behavior During Therapy WFL for tasks assessed/performed      Past Medical History:  Diagnosis Date  . Allergic rhinitis   . Hyperlipidemia   . Hypertension     Past Surgical History:  Procedure Laterality Date  . gyn surgery  2002   hysterectomy    There were no vitals filed for this visit.      Subjective Assessment - 06/05/16 1348    Subjective Neck feels more loose. No pain currently.  3/10 neck pain at most for the past 7 days. Pt states that her insurance is working on processing the information on the TENS unit.     Pertinent History Neck pain for over 20 years. 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 Neck pain progressed as years went on. Had chiropractic, accupuncture, and massage treatment which helped temporarily. Continued with massage therapy to help ease her pain. Took Advil which helps with her pain.  Changed to Tylenol to decrease risk of CVA or heart attack. Her pain however increase since Tylenol is not anti-inflammatory medication. Pt also has difficulty controlling her blood pressure due to increased pain. Pain elevates her blood pressure. Had an x-ray in her neck which revealed issues at her neck bones. Was referred to a neurologist. Had an MRI performed for her neck which revealed a bone encroachment in her neck and was referred to PT.  Pt  states that she can function normally but lifting, looking up, turning her head increases her neck pain.  When her neck bothers her, her fingers swell up as well.  Pain does not wake her up at night if her neck is positioned properly (uses flat pillows, R S/L ). Pt also states having radiating symptoms bilateral UE.  Bilateral hand swelling occurs when her neck bothers her.  Pt also states feeling relief from her neck when her massage therapist pulls on her neck but gets muscle bulges which takes 2 days to subside. Goal is also to avoid surgery.    Patient Stated Goals Turn her head better to look behind her when driving. Avoid surgery.   Currently in Pain? No/denies   Pain Score 0-No pain   Pain Onset More than a month ago            [facility] PT Assessment - 06/05/16 0001      Strength   Left Shoulder Flexion 4+/5   Left Shoulder ABduction 4+/5   Left Elbow Flexion 4+/5   Left Elbow Extension 4+/5                             PT Education - 06/05/16 1355    Education provided Yes   Education Details ther-ex   Person(s) Educated Patient   Methods Explanation;Demonstration;Tactile cues;Verbal  cues   Comprehension Returned demonstration;Verbalized understanding        Objectives    There-ex  Directed patient with seated manually resisted L and R scapular retraction targeting the lower trap muscles. 5x10 seconds alternating sides for 2sets.   L and R UT stretch 10 seconds x 5.   Supine deep cervical flexion 5x3. Min cues to maintain chin tuck.  Supine bilateral serratus reach 10x5 seconds for 2 sets. Improved ability to decrease bilateral shoulder shrug  Seated L shoulder flexion, abduction, L elbow flexion, extension 1-2x each way  Reviewed progress/current status with L UE strength with pt  Bilateral PNF D2 flexion 10x3 with emphasis on scapular control     seated thoracic extension over chair 10x with 5 seconds to promote thoracic  extension.  Seated L and R cervical rotation with chin tucks 5x each direction.   Improved exercise technique, movement at target joints, use of target muscles after min to mod verbal, visual, tactile cues.       Pt demonstrates overall improved L UE strength and bilateral scapular control with decreased UT use since initial evaluation. No complain of increased neck pain throughout session.               PT Long Term Goals - 05/01/16 1924      PT LONG TERM GOAL #1   Title Patient will have a decrease in neck pain to 5/10 or less at worst to promote ability to turn her head as well as to look up, and lift objects.    Baseline 8/10 at worst   Time 6   Period Weeks   Status New     PT LONG TERM GOAL #2   Title Patient will improve L cervical rotation to at least 55 degrees without pain to promote ability to turn her head.   Baseline 45 degrees   Time 6   Period Weeks   Status New     PT LONG TERM GOAL #3   Title Patient will improve her Neck Disability Index score by at least 6% as a demonstration of improved function.    Baseline 22%   Time 6   Period Weeks   Status New     PT LONG TERM GOAL #4   Title Patient will improve L shoulder flexion, abduction, and L biceps flexion strength by at least 1/2 MMT grade to promote ability to use her L UE for functional tasks.    Baseline 4/5   Time 6   Period Weeks   Status New               Plan - 06/05/16 1344    Clinical Impression Statement Pt demonstrates overall improved L UE strength and bilateral scapular control with decreased UT use since initial evaluation. No complain of increased neck pain throughout session.    Rehab Potential Fair   Clinical Impairments Affecting Rehab Potential Chronicity of condition   PT Frequency 2x / week   PT Duration 6 weeks   PT Treatment/Interventions Therapeutic exercise;Manual techniques;Therapeutic activities;Patient/family education;Dry needling;Neuromuscular  re-education   PT Next Visit Plan soft tissue mobilization, scapular strengthening, thoracic extension   Consulted and Agree with Plan of Care Patient      Patient will benefit from skilled therapeutic intervention in order to improve the following deficits and impairments:  Pain, Postural dysfunction, Improper body mechanics, Decreased range of motion  Visit Diagnosis: Cervicalgia  Radiculopathy, cervical region  Muscle weakness (generalized)  Problem List Patient Active Problem List   Diagnosis Date Noted  . Cervical pain (neck) 03/11/2016  . Lung nodule seen on imaging study 08/31/2015  . Palpitations 06/01/2015  . Panic attack as reaction to stress 06/01/2015  . Hypokalemia 08/02/2014  . Benign paroxysmal positional vertigo 08/02/2014  . DJD (degenerative joint disease) of cervical spine 02/17/2013  . HLD (hyperlipidemia) 11/08/2010  . Essential hypertension 11/08/2010  . ALLERGIC RHINITIS 11/08/2010    Joneen Boers PT, DPT   06/05/2016, 8:03 PM  [place] [facility] [address], [place], [address] Phone: [phone]   Fax:  [phone]  Name: KAIYLA FRANCE MRN: [id number] Date of Birth: [birth year]

## 2016-06-10 ENCOUNTER — Ambulatory Visit: Payer: Commercial Managed Care - HMO

## 2016-06-10 DIAGNOSIS — M542 Cervicalgia: Secondary | ICD-10-CM | POA: Diagnosis not present

## 2016-06-10 DIAGNOSIS — M5412 Radiculopathy, cervical region: Secondary | ICD-10-CM

## 2016-06-10 DIAGNOSIS — M6281 Muscle weakness (generalized): Secondary | ICD-10-CM

## 2016-06-10 NOTE — Therapy (Signed)
[place] [facility] [address] [place], [address] Phone: [phone]   Fax:  [phone]  Physical Therapy Treatment  Patient Details  Name: Robin Peck MRN: [id number] Date of Birth: 12-Dec-1945 Referring Provider: Mallie Mussel A. Pool, MD  Encounter Date: 06/10/2016      PT End of Session - 06/10/16 1346    Visit Number 11   Number of Visits 13   Date for PT Re-Evaluation 06/13/16   PT Start Time 1346   PT Stop Time 1429   PT Time Calculation (min) 43 min   Activity Tolerance Patient tolerated treatment well   Behavior During Therapy WFL for tasks assessed/performed      Past Medical History:  Diagnosis Date  . Allergic rhinitis   . Hyperlipidemia   . Hypertension     Past Surgical History:  Procedure Laterality Date  . gyn surgery  2002   hysterectomy    There were no vitals filed for this visit.      Subjective Assessment - 06/10/16 1348    Subjective Pt states doing her exercises with the rubberband and is sore. Looking up is hard for her. Neck feels sore but its not a pain kind. Pt also adds feeling a pressure from her neck to her R hand when she writes. Does not write often. Fingers do not get numb from writing since PT. Can turn her neck a whole lot better than it was.    Pertinent History Neck pain for over 20 years. 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 Neck pain progressed as years went on. Had chiropractic, accupuncture, and massage treatment which helped temporarily. Continued with massage therapy to help ease her pain. Took Advil which helps with her pain.  Changed to Tylenol to decrease risk of CVA or heart attack. Her pain however increase since Tylenol is not anti-inflammatory medication. Pt also has difficulty controlling her blood pressure due to increased pain. Pain elevates her blood pressure. Had an x-ray in her neck which revealed issues at her neck  bones. Was referred to a neurologist. Had an MRI performed for her neck which revealed a bone encroachment in her neck and was referred to PT.  Pt states that she can function normally but lifting, looking up, turning her head increases her neck pain.  When her neck bothers her, her fingers swell up as well.  Pain does not wake her up at night if her neck is positioned properly (uses flat pillows, R S/L ). Pt also states having radiating symptoms bilateral UE.  Bilateral hand swelling occurs when her neck bothers her.  Pt also states feeling relief from her neck when her massage therapist pulls on her neck but gets muscle bulges which takes 2 days to subside. Goal is also to avoid surgery.    Patient Stated Goals Turn her head better to look behind her when driving. Avoid surgery.   Currently in Pain? No/denies   Pain Score --  soreness   Pain Onset More than a month ago            [facility] PT Assessment - 06/10/16 1403      Observation/Other Assessments   Neck Disability Index  24%     AROM   Cervical - Right Rotation 70   Cervical - Left Rotation 60  PT Education - 06/10/16 1354    Education provided Yes   Education Details ther-ex   [employer]) Educated Patient   Methods Explanation;Demonstration;Tactile cues;Verbal cues   Comprehension Returned demonstration;Verbalized understanding       Objectives    There-ex  Directed patient with seated chin tucks and scapular retraction with cervical extension 10x3  Decreased neck discomfort with looking up.   Reviewed and given as part of her HEP 10x3 daily as part of her HEP. Pt demonstrated and verbalized understanding.   Wall pectoralis stretch 15 seconds x 3 each UE    manually resisted L and R scapular retraction targeting the lower trap muscles. 5x10 seconds alternating sidesfor 2sets.    Pt education on using the pillow to support entire neck to shoulders when sleeping  for neck comfort to answer pt question.   Bilateral PNF D2 flexion 10x3 with emphasis on scapular control  Supine deep cervical flexion 5x3. Min cues to maintain chin tuck. Better able to perform compared to previous sessions.   Supine bilateral serratus reach 10x5 seconds for 2 sets.  Seated L and R cervical rotation with chin tucks 5x2 each direction.   Improved exercise technique, movement at target joints, use of target muscles after min to mod verbal, visual, tactile cues.     Improved R cervical rotation AROM to 70 degrees, L cervical rotation AROM to 60 degrees. Decreased neck discomfort with cervical extension following exercises to promote upper thoracic extension.              PT Long Term Goals - 05/01/16 1924      PT LONG TERM GOAL #1   Title Patient will have a decrease in neck pain to 5/10 or less at worst to promote ability to turn her head as well as to look up, and lift objects.    Baseline 8/10 at worst   Time 6   Period Weeks   Status New     PT LONG TERM GOAL #2   Title Patient will improve L cervical rotation to at least 55 degrees without pain to promote ability to turn her head.   Baseline 45 degrees   Time 6   Period Weeks   Status New     PT LONG TERM GOAL #3   Title Patient will improve her Neck Disability Index score by at least 6% as a demonstration of improved function.    Baseline 22%   Time 6   Period Weeks   Status New     PT LONG TERM GOAL #4   Title Patient will improve L shoulder flexion, abduction, and L biceps flexion strength by at least 1/2 MMT grade to promote ability to use her L UE for functional tasks.    Baseline 4/5   Time 6   Period Weeks   Status New               Plan - 06/10/16 1354    Clinical Impression Statement Improved R cervical rotation AROM to 70 degrees, L cervical rotation AROM to 60 degrees. Decreased neck discomfort with cervical extension following exercises to promote upper thoracic  extension.    Rehab Potential Fair   Clinical Impairments Affecting Rehab Potential Chronicity of condition   PT Frequency 2x / week   PT Duration 6 weeks   PT Treatment/Interventions Therapeutic exercise;Manual techniques;Therapeutic activities;Patient/family education;Dry needling;Neuromuscular re-education   PT Next Visit Plan soft tissue mobilization, scapular strengthening, thoracic extension   Consulted and Agree with  Plan of Care Patient      Patient will benefit from skilled therapeutic intervention in order to improve the following deficits and impairments:  Pain, Postural dysfunction, Improper body mechanics, Decreased range of motion  Visit Diagnosis: Cervicalgia  Radiculopathy, cervical region  Muscle weakness (generalized)     Problem List Patient Active Problem List   Diagnosis Date Noted  . Cervical pain (neck) 03/11/2016  . Lung nodule seen on imaging study 08/31/2015  . Palpitations 06/01/2015  . Panic attack as reaction to stress 06/01/2015  . Hypokalemia 08/02/2014  . Benign paroxysmal positional vertigo 08/02/2014  . DJD (degenerative joint disease) of cervical spine 02/17/2013  . HLD (hyperlipidemia) 11/08/2010  . Essential hypertension 11/08/2010  . ALLERGIC RHINITIS 11/08/2010    Joneen Boers PT, DPT   06/10/2016, 8:13 PM  [facility] [address] [place], [address] Phone: [phone]   Fax:  [phone]  Name: SHASHONA KUENSTLER MRN: [id number] Date of Birth: [birth year]

## 2016-06-12 ENCOUNTER — Ambulatory Visit: Payer: Commercial Managed Care - HMO

## 2016-06-12 DIAGNOSIS — M5412 Radiculopathy, cervical region: Secondary | ICD-10-CM

## 2016-06-12 DIAGNOSIS — M542 Cervicalgia: Secondary | ICD-10-CM

## 2016-06-12 DIAGNOSIS — M6281 Muscle weakness (generalized): Secondary | ICD-10-CM

## 2016-06-12 NOTE — Therapy (Signed)
[facility] [address], [place], [address] Phone: [phone]   Fax:  [phone]  Physical Therapy Treatment  Patient Details  Name: Robin Peck MRN: [id number] Date of Birth: Jan 19, 1946 Referring Provider: Mallie Mussel A. Pool, MD  Encounter Date: 06/12/2016      PT End of Session - 06/12/16 1049    Visit Number 12   Number of Visits 15   Date for PT Re-Evaluation 06/20/16   PT Start Time [id number]   PT Stop Time 1141   PT Time Calculation (min) 52 min   Activity Tolerance Patient tolerated treatment well   Behavior During Therapy WFL for tasks assessed/performed      Past Medical History:  Diagnosis Date  . Allergic rhinitis   . Hyperlipidemia   . Hypertension     Past Surgical History:  Procedure Laterality Date  . gyn surgery  2002   hysterectomy    There were no vitals filed for this visit.      Subjective Assessment - 06/12/16 1050    Subjective Pt states that sometimes she feels good, sometimes the same issue returns. Has a relapse again. Feels like the muscle at the bottom of her neck feels really tight.  Gets frustrated. Similar to massage therapy when she gets relief but her muscles tighten up for no reason. Feels like a muscle spasm.  4-5/10 neck pain currently.  Pain increased after last sessions's appointment.  Thinks the chest stretch at the wall might have  increased her symptoms.  Still waiting to hear from her insurance.   Wants to continue PT until the end of next week.  Neck pain at most: 4/10 for the past 7 days. Was fine until yesterday.     Pertinent History Neck pain for over 20 years. 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 Neck pain progressed as years went on. Had chiropractic, accupuncture, and massage treatment which helped temporarily. Continued with massage therapy to help ease her pain. Took Advil which helps with her pain.  Changed to  Tylenol to decrease risk of CVA or heart attack. Her pain however increase since Tylenol is not anti-inflammatory medication. Pt also has difficulty controlling her blood pressure due to increased pain. Pain elevates her blood pressure. Had an x-ray in her neck which revealed issues at her neck bones. Was referred to a neurologist. Had an MRI performed for her neck which revealed a bone encroachment in her neck and was referred to PT.  Pt states that she can function normally but lifting, looking up, turning her head increases her neck pain.  When her neck bothers her, her fingers swell up as well.  Pain does not wake her up at night if her neck is positioned properly (uses flat pillows, R S/L ). Pt also states having radiating symptoms bilateral UE.  Bilateral hand swelling occurs when her neck bothers her.  Pt also states feeling relief from her neck when her massage therapist pulls on her neck but gets muscle bulges which takes 2 days to subside. Goal is also to avoid surgery.    Patient Stated Goals Turn her head better to look behind her when driving. Avoid surgery.   Currently in Pain? Yes   Pain Score 5    Pain Onset More than a month ago            [facility] PT Assessment - 06/12/16 1124  Strength   Left Elbow Extension 5/5   Left Wrist Extension 5/5                             PT Education - 06/12/16 1117    Education provided Yes   Education Details ther-ex   Person(s) Educated Patient   Methods Explanation;Demonstration;Tactile cues;Verbal cues   Comprehension Returned demonstration;Verbalized understanding         Objectives  Muscle tension palpated R UT   There-ex   Pt was recommended to stop performing the wall pectoralis stretch and the supine deep cervical flexion exercise at home  Pt verbalized understanding.   Directed patient with seated manually resisted scapular retraction targeting lower trap muscles.   10x2 with 10 second for  R  10x2 with 10 seconds for L   Decreased neck pain to 3/10. Pt states being able to move her neck better.     Seated chin tucks 10x10 seconds for 2 sets with gentle scapular retraction   Pt states that her neck feels better and more loose afterwards.   Standing bilateral shoulder extension resisting yellow band 10x3   Manually resisted L elbow extension, L wrist extension 2x each way.   Reviewed progress with L UE strength with pt.   Recommended over the counter TENS unit for pain control for pt if pt continues to have a difficult time with her insurance for a portable TENS unit.   Reviewed plan of care: 2x/week for 1 more week.   Improved exercise technique, movement at target joints, use of target muscles after mod verbal, visual, tactile cues.    Decreased neck pain to 3/10 following exercises to promote activation of lower trap muscles, decrease upper trap muscle use, improve upper thoracic extension and exercises to promote proper neck posture. Pt also demonstrates improved L UE strength, ability to turn her head, and decreased neck pain level to 4-5/10 at worst since initial evaluation. Pt still demonstrates difficulty performing functional tasks as well as recurrences in neck pain and would like to continue for 1 more week of PT 2x/week. Pt will benefit from continued skilled physical therapy services to address the aforementioned deficits.            PT Long Term Goals - 06/12/16 1159      PT LONG TERM GOAL #1   Title Patient will have a decrease in neck pain to 5/10 or less at worst to promote ability to turn her head as well as to look up, and lift objects.    Baseline 8/10 at worst; 4-5/10 at worst (06/12/2016)   Time 6   Period Weeks   Status Achieved     PT LONG TERM GOAL #2   Title Patient will improve L cervical rotation to at least 55 degrees without pain to promote ability to turn her head.   Baseline 45 degrees; 60 degrees L cervical rotation (06/12/2016)    Time 6   Period Weeks   Status Achieved     PT LONG TERM GOAL #3   Title Patient will improve her Neck Disability Index score by at least 6% as a demonstration of improved function.    Baseline 22%; 24% (06/12/2016)   Time 1   Period Weeks   Status On-going     PT LONG TERM GOAL #4   Title Patient will improve L shoulder flexion, abduction, and L biceps flexion strength by at least 1/2 MMT grade  to promote ability to use her L UE for functional tasks.    Baseline 4/5; 4+/5 to 5/5 (06/12/2016)   Time 6   Period Weeks   Status Achieved               Plan - 06/12/16 1117    Clinical Impression Statement Decreased neck pain to 3/10 following exercises to promote activation of lower trap muscles, decrease upper trap muscle use, improve upper thoracic extension and exercises to promote proper neck posture. Pt also demonstrates improved L UE strength, ability to turn her head, and decreased neck pain level to 4-5/10 at worst since initial evaluation. Pt still demonstrates difficulty performing functional tasks as well as recurrences in neck pain and would like to continue for 1 more week of PT 2x/week. Pt will benefit from continued skilled physical therapy services to address the aforementioned deficits.    Rehab Potential Fair   Clinical Impairments Affecting Rehab Potential Chronicity of condition   PT Frequency 2x / week   PT Duration 6 weeks   PT Treatment/Interventions Therapeutic exercise;Manual techniques;Therapeutic activities;Patient/family education;Dry needling;Neuromuscular re-education   PT Next Visit Plan soft tissue mobilization, scapular strengthening, thoracic extension   Consulted and Agree with Plan of Care Patient      Patient will benefit from skilled therapeutic intervention in order to improve the following deficits and impairments:  Pain, Postural dysfunction, Improper body mechanics, Decreased range of motion  Visit Diagnosis: Cervicalgia - Plan: PT plan  of care cert/re-cert  Radiculopathy, cervical region - Plan: PT plan of care cert/re-cert  Muscle weakness (generalized) - Plan: PT plan of care cert/re-cert     Problem List Patient Active Problem List   Diagnosis Date Noted  . Cervical pain (neck) 03/11/2016  . Lung nodule seen on imaging study 08/31/2015  . Palpitations 06/01/2015  . Panic attack as reaction to stress 06/01/2015  . Hypokalemia 08/02/2014  . Benign paroxysmal positional vertigo 08/02/2014  . DJD (degenerative joint disease) of cervical spine 02/17/2013  . HLD (hyperlipidemia) 11/08/2010  . Essential hypertension 11/08/2010  . ALLERGIC RHINITIS 11/08/2010    Joneen Boers PT, DPT   06/12/2016, 12:09 PM  [facility] [address], [place], [address] Phone: [phone]   Fax:  [phone]  Name: WILHEMENA ARIAN MRN: [id number] Date of Birth: 01-Sep-1946

## 2016-06-18 ENCOUNTER — Ambulatory Visit: Payer: Commercial Managed Care - HMO

## 2016-06-18 DIAGNOSIS — M6281 Muscle weakness (generalized): Secondary | ICD-10-CM

## 2016-06-18 DIAGNOSIS — M542 Cervicalgia: Secondary | ICD-10-CM

## 2016-06-18 DIAGNOSIS — M5412 Radiculopathy, cervical region: Secondary | ICD-10-CM

## 2016-06-18 NOTE — Therapy (Signed)
[facility] [address], [place], [address] Phone: [phone]   Fax:  [phone]  Physical Therapy Treatment  Patient Details  Name: Robin Peck MRN: [id number] Date of Birth: 1946-06-14 Referring Provider: Mallie Mussel A. Pool, MD  Encounter Date: 06/18/2016      PT End of Session - 06/18/16 1603    Visit Number 13   Number of Visits 15   Date for PT Re-Evaluation 06/20/16   PT Start Time 1604   PT Stop Time [id number]   PT Time Calculation (min) 40 min   Activity Tolerance Patient tolerated treatment well   Behavior During Therapy [facility] for tasks assessed/performed      Past Medical History:  Diagnosis Date  . Allergic rhinitis   . Hyperlipidemia   . Hypertension     Past Surgical History:  Procedure Laterality Date  . gyn surgery  2002   hysterectomy    There were no vitals filed for this visit.      Subjective Assessment - 06/18/16 1604    Subjective Neck feels stiff. No pain. No muscle knots on UT muscles. Does her exercises every other day at home.   Pertinent History Neck pain for over 20 years. 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 Neck pain progressed as years went on. Had chiropractic, accupuncture, and massage treatment which helped temporarily. Continued with massage therapy to help ease her pain. Took Advil which helps with her pain.  Changed to Tylenol to decrease risk of CVA or heart attack. Her pain however increase since Tylenol is not anti-inflammatory medication. Pt also has difficulty controlling her blood pressure due to increased pain. Pain elevates her blood pressure. Had an x-ray in her neck which revealed issues at her neck bones. Was referred to a neurologist. Had an MRI performed for her neck which revealed a bone encroachment in her neck and was referred to PT.  Pt states that she can function normally but lifting, looking up, turning her  head increases her neck pain.  When her neck bothers her, her fingers swell up as well.  Pain does not wake her up at night if her neck is positioned properly (uses flat pillows, R S/L ). Pt also states having radiating symptoms bilateral UE.  Bilateral hand swelling occurs when her neck bothers her.  Pt also states feeling relief from her neck when her massage therapist pulls on her neck but gets muscle bulges which takes 2 days to subside. Goal is also to avoid surgery.    Patient Stated Goals Turn her head better to look behind her when driving. Avoid surgery.   Currently in Pain? No/denies   Pain Score 0-No pain   Pain Onset More than a month ago                                 PT Education - 06/18/16 1654    Education provided Yes   Education Details Ther-ex, over the counter TENS unit   Person(s) Educated Patient   Methods Explanation;Demonstration;Tactile cues;Verbal cues   Comprehension Returned demonstration;Verbalized understanding        Objectives  E-stim:  Pt brought her over the counter TENs unit.  Time taken to review AccuRelief dual channel TENs unit with pt for pt to perform safely for her neck pain. 30 minutes.    There-ex  Directed patient with seated manually resisted scapular retraction targeting lower trap muscles.              10x2 with 10 second for R             10x2 with 10 seconds for L  Improved exercise technique, movement at target joints, use of target muscles after min to mod verbal, visual, tactile cues.    Pt able to use her over the counter TENs unit for her neck independently safely after review. No complain of neck pain throughout session. Min to mod cues for resisted scapular retraction exercise to promote lower trap muscle use and decrease upper trap muscle compensation.                                  PT Long Term Goals - 06/12/16 1159      PT LONG TERM GOAL #1   Title Patient will have a decrease  in neck pain to 5/10 or less at worst to promote ability to turn her head as well as to look up, and lift objects.    Baseline 8/10 at worst; 4-5/10 at worst (06/12/2016)   Time 6   Period Weeks   Status Achieved     PT LONG TERM GOAL #2   Title Patient will improve L cervical rotation to at least 55 degrees without pain to promote ability to turn her head.   Baseline 45 degrees; 60 degrees L cervical rotation (06/12/2016)   Time 6   Period Weeks   Status Achieved     PT LONG TERM GOAL #3   Title Patient will improve her Neck Disability Index score by at least 6% as a demonstration of improved function.    Baseline 22%; 24% (06/12/2016)   Time 1   Period Weeks   Status On-going     PT LONG TERM GOAL #4   Title Patient will improve L shoulder flexion, abduction, and L biceps flexion strength by at least 1/2 MMT grade to promote ability to use her L UE for functional tasks.    Baseline 4/5; 4+/5 to 5/5 (06/12/2016)   Time 6   Period Weeks   Status Achieved               Plan - 06/18/16 1655    Clinical Impression Statement Pt able to use her over the counter TENs unit for her neck independently safely after review. No complain of neck pain throughout session. Min to mod cues for resisted scapular retraction exercise to promote lower trap muscle use and decrease upper trap muscle compensation.    Rehab Potential Fair   Clinical Impairments Affecting Rehab Potential Chronicity of condition   PT Frequency 2x / week   PT Duration Other (comment)  1 week   PT Treatment/Interventions Therapeutic exercise;Manual techniques;Therapeutic activities;Patient/family education;Dry needling;Neuromuscular re-education   PT Next Visit Plan soft tissue mobilization, scapular strengthening, thoracic extension   Consulted and Agree with Plan of Care Patient      Patient will benefit from skilled therapeutic intervention in order to improve the following deficits and impairments:  Pain,  Postural dysfunction, Improper body mechanics, Decreased range of motion  Visit Diagnosis: Cervicalgia  Radiculopathy, cervical region  Muscle weakness (generalized)     Problem List Patient Active Problem List   Diagnosis Date Noted  . Cervical pain (neck) 03/11/2016  . Lung nodule seen on imaging study 08/31/2015  .  Palpitations 06/01/2015  . Panic attack as reaction to stress 06/01/2015  . Hypokalemia 08/02/2014  . Benign paroxysmal positional vertigo 08/02/2014  . DJD (degenerative joint disease) of cervical spine 02/17/2013  . HLD (hyperlipidemia) 11/08/2010  . Essential hypertension 11/08/2010  . ALLERGIC RHINITIS 11/08/2010    Joneen Boers PT, DPT   06/18/2016, 4:59 PM  [facility] [address] [place], [address] Phone: [phone]   Fax:  [phone]  Name: Robin Peck MRN: [id number] Date of Birth: 22-Dec-1945

## 2016-06-20 ENCOUNTER — Ambulatory Visit: Payer: Commercial Managed Care - HMO

## 2016-06-20 DIAGNOSIS — M542 Cervicalgia: Secondary | ICD-10-CM

## 2016-06-20 DIAGNOSIS — M6281 Muscle weakness (generalized): Secondary | ICD-10-CM

## 2016-06-20 DIAGNOSIS — M5412 Radiculopathy, cervical region: Secondary | ICD-10-CM

## 2016-06-20 NOTE — Therapy (Signed)
[facility] [address], [place], [address] Phone: [phone]   Fax:  [phone]  Physical Therapy Treatment And Discharge Summary  Patient Details  Name: Robin Peck MRN: [id number] Date of Birth: 08-20-1946 Referring Provider: Mallie Mussel A. Pool, MD  Encounter Date: 06/20/2016      PT End of Session - 06/20/16 1653    Visit Number 14   Number of Visits 15   Date for PT Re-Evaluation 06/20/16   PT Start Time [id number]   PT Stop Time 1728   PT Time Calculation (min) 35 min   Activity Tolerance Patient tolerated treatment well   Behavior During Therapy [facility] for tasks assessed/performed      Past Medical History:  Diagnosis Date  . Allergic rhinitis   . Hyperlipidemia   . Hypertension     Past Surgical History:  Procedure Laterality Date  . gyn surgery  2002   hysterectomy    There were no vitals filed for this visit.      Subjective Assessment - 06/20/16 1654    Subjective "I feel like a normal human being today." Also drove to the beach very early this morning, and back this afternoon and neck did not bother her. Wants to do the resisted scapular retraction exercise today.    Pertinent History Neck pain for over 20 years. 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 Neck pain progressed as years went on. Had chiropractic, accupuncture, and massage treatment which helped temporarily. Continued with massage therapy to help ease her pain. Took Advil which helps with her pain.  Changed to Tylenol to decrease risk of CVA or heart attack. Her pain however increase since Tylenol is not anti-inflammatory medication. Pt also has difficulty controlling her blood pressure due to increased pain. Pain elevates her blood pressure. Had an x-ray in her neck which revealed issues at her neck bones. Was referred to a neurologist. Had an MRI performed for her neck which revealed a bone  encroachment in her neck and was referred to PT.  Pt states that she can function normally but lifting, looking up, turning her head increases her neck pain.  When her neck bothers her, her fingers swell up as well.  Pain does not wake her up at night if her neck is positioned properly (uses flat pillows, R S/L ). Pt also states having radiating symptoms bilateral UE.  Bilateral hand swelling occurs when her neck bothers her.  Pt also states feeling relief from her neck when her massage therapist pulls on her neck but gets muscle bulges which takes 2 days to subside. Goal is also to avoid surgery.    Patient Stated Goals Turn her head better to look behind her when driving. Avoid surgery.   Currently in Pain? No/denies   Pain Score 0-No pain   Pain Onset More than a month ago                                 PT Education - 06/20/16 1747    Education provided Yes   Education Details ther-ex, HEP   Person(s) Educated Patient   Methods Explanation;Demonstration;Tactile cues;Verbal cues   Comprehension Returned demonstration;Verbalized understanding        Objectives   There-ex  Directed patient with seated manually resisted scapular retraction targeting lower trap muscles.  10x2 with 10 second for R             10x2 with 10 seconds for L  Standing bilateral shoulder extension to neutral 10x5 seconds for 3 sets                     Reviewed scapular retraction movements, making sure not to shrug shoulders and making sure she feels her lower trap muscles work. Pt demonstrated and verbalized understanding.   Seated chin tucks 10x10 seconds with scapular retraction to promote upper thoracic extension.   Reviewed seated thoracic extension using chair but crossing her arms. Pt demonstrated and verbalized understanding.   Instructed patient to breathe with exercises    Improved exercise technique, movement at target joints, use of target muscles  after min to mod verbal, visual, tactile cues.      Patient has demonstrated very good progress with physical therapy towards goals, meeting 3 out of 4 of them as well as significant decrease in neck pain. Patient also demonstrates improved cervical rotation AROM and L UE strength. Patient is also independent and consistent with her HEP. Skilled physical therapy services discharged with patient continuing progress with her home exercise program.             PT Long Term Goals - 06/20/16 1906      PT LONG TERM GOAL #1   Title Patient will have a decrease in neck pain to 5/10 or less at worst to promote ability to turn her head as well as to look up, and lift objects.    Baseline 8/10 at worst; 4-5/10 at worst (06/12/2016)   Time 6   Period Weeks   Status Achieved     PT LONG TERM GOAL #2   Title Patient will improve L cervical rotation to at least 55 degrees without pain to promote ability to turn her head.   Baseline 45 degrees; 60 degrees L cervical rotation (06/12/2016)   Time 6   Period Weeks   Status Achieved     PT LONG TERM GOAL #3   Title Patient will improve her Neck Disability Index score by at least 6% as a demonstration of improved function.    Baseline 22%; 24% (06/12/2016)   Time 1   Period Weeks   Status On-going     PT LONG TERM GOAL #4   Title Patient will improve L shoulder flexion, abduction, and L biceps flexion strength by at least 1/2 MMT grade to promote ability to use her L UE for functional tasks.    Baseline 4/5; 4+/5 to 5/5 (06/12/2016)   Time 6   Period Weeks   Status Achieved               Plan - 06/20/16 1728    Clinical Impression Statement Patient has demonstrated very good progress with physical therapy towards goals, meeting 3 out of 4 of them as well as significant decrease in neck pain. Patient also demonstrates improved cervical rotation AROM and L UE strength. Patient is also independent and consistent with her HEP. Skilled  physical therapy services discharged with patient continuing progress with her home exercise program.     Rehab Potential --   Clinical Impairments Affecting Rehab Potential --   PT Frequency --   PT Duration --  1 week   PT Treatment/Interventions Therapeutic exercise;Manual techniques;Therapeutic activities;Patient/family education;Neuromuscular re-education   PT Next Visit Plan Continue progress with her home exercise program   Consulted and  Agree with Plan of Care Patient      Patient will benefit from skilled therapeutic intervention in order to improve the following deficits and impairments:  Pain, Postural dysfunction, Improper body mechanics, Decreased range of motion  Visit Diagnosis: Cervicalgia  Radiculopathy, cervical region  Muscle weakness (generalized)     Problem List Patient Active Problem List   Diagnosis Date Noted  . Cervical pain (neck) 03/11/2016  . Lung nodule seen on imaging study 08/31/2015  . Palpitations 06/01/2015  . Panic attack as reaction to stress 06/01/2015  . Hypokalemia 08/02/2014  . Benign paroxysmal positional vertigo 08/02/2014  . DJD (degenerative joint disease) of cervical spine 02/17/2013  . HLD (hyperlipidemia) 11/08/2010  . Essential hypertension 11/08/2010  . ALLERGIC RHINITIS 11/08/2010    Thank you for your referral.   Joneen Boers PT, DPT   06/20/2016, 7:10 PM  [facility] [address], [place], [address] Phone: [phone]   Fax:  [phone]  Name: Robin Peck MRN: [id number] Date of Birth: 10/13/1945

## 2016-06-20 NOTE — Patient Instructions (Addendum)
      Scapular Retraction (Standing)   With arms at sides, pinch shoulder blades together and down. Hold for 5 seconds. Repeat __10__ times per set. Do __3__ sets per session.     Copyright  VHI. All rights reserved.

## 2016-06-28 ENCOUNTER — Ambulatory Visit: Payer: Commercial Managed Care - HMO

## 2016-07-01 ENCOUNTER — Encounter: Payer: Self-pay | Admitting: Urology

## 2016-07-01 ENCOUNTER — Ambulatory Visit: Payer: Commercial Managed Care - HMO | Admitting: Urology

## 2016-07-01 VITALS — BP 146/62 | HR 87 | Ht 60.0 in | Wt 126.0 lb

## 2016-07-01 DIAGNOSIS — R3129 Other microscopic hematuria: Secondary | ICD-10-CM | POA: Diagnosis not present

## 2016-07-01 LAB — MICROSCOPIC EXAMINATION: Bacteria, UA: NONE SEEN

## 2016-07-01 LAB — URINALYSIS, COMPLETE
Bilirubin, UA: NEGATIVE
Glucose, UA: NEGATIVE
Ketones, UA: NEGATIVE
Nitrite, UA: NEGATIVE
Protein, UA: NEGATIVE
Specific Gravity, UA: 1.01 (ref 1.005–1.030)
Urobilinogen, Ur: 0.2 mg/dL (ref 0.2–1.0)
pH, UA: 7 (ref 5.0–7.5)

## 2016-07-01 NOTE — Progress Notes (Signed)
70 year old female who presents for annual evaluation. She was last seen 1 year ago when she was worked up for microscopic hematuria. At the time of the patient's initial UA she was having symptoms of infection. Follow-up UA is also demonstrated microscopic hematuria without symptoms. A complete workup was done including cystoscopy and CT scan. These demonstrating no clear etiology of her microscopic hematuria. The patient states that this is been a ongoing issue with her, and she is known for a long time that she has microscopic hematuria.  In the interval since the patient was last seen she denies any progressive voiding symptoms. She denies any gross hematuria. She denies any dysuria or evidence of infection. She denies any CVA tenderness or passage of stone. She has a good stream and feels that she empties her bladder completely. She denies any urinary incontinence.   Current Outpatient Prescriptions on File Prior to Visit  Medication Sig Dispense Refill  . ALPRAZolam (XANAX) 0.25 MG tablet Take 1 tablet (0.25 mg total) by mouth 2 (two) times daily as needed for anxiety or sleep. 30 tablet 0  . atorvastatin (LIPITOR) 10 MG tablet TAKE 1 TABLET (10 MG TOTAL) BY MOUTH DAILY. 90 tablet 2  . cetirizine (ZYRTEC) 10 MG tablet TAKE 1 TABLET (10 MG TOTAL) BY MOUTH DAILY. 90 tablet 0  . cyclobenzaprine (FLEXERIL) 5 MG tablet Take 1 tablet (5 mg total) by mouth 3 (three) times daily as needed for muscle spasms. 60 tablet 1  . hydrochlorothiazide (HYDRODIURIL) 25 MG tablet TAKE 1 TABLET (25 MG TOTAL) BY MOUTH DAILY. 90 tablet 2  . KLOR-CON M20 20 MEQ tablet TAKE 1 TABLET BY MOUTH 2 TIMES DAILY 60 tablet 3  . NORVASC 5 MG tablet TAKE 1 TO 2 TABLETS EVERY DAY 90 tablet 1  . [DISCONTINUED] potassium chloride 20 MEQ TBCR Take 10 mEq by mouth 2 (two) times daily. 30 tablet 0   No current facility-administered medications on file prior to visit.    Past Medical History:  Diagnosis Date  . Allergic rhinitis    . Hyperlipidemia   . Hypertension    Past Surgical History:  Procedure Laterality Date  . gyn surgery  2002   hysterectomy   Vitals:   07/01/16 0855  BP: (!) 146/62  Pulse: 87   NAD  UA: Urine dipstick shows positive for RBC's.  Micro exam: 3-10 RBC's per HPF.  Imp: The patient has asymptomatic microscopic hematuria. This was completely evaluated 1 year ago. She has no progressive voiding symptoms or change since she was last evaluated. She continues to have microscopic hematuria.  Recommendations: I discussed the diagnosis with the patient in quite a bit of detail. I explained to her that she needs to have a urinalysis performed on an annual basis, this can be performed by her primary care doctor. The microscopic hematuria does not need to be reevaluated or worked up again for another 3-5 years. As such, at this point, we have not scheduled follow-up, but I do recommend that she follow up with her primary care doctor on an annual basis.

## 2016-07-11 ENCOUNTER — Other Ambulatory Visit: Payer: Self-pay | Admitting: *Deleted

## 2016-07-11 DIAGNOSIS — R911 Solitary pulmonary nodule: Secondary | ICD-10-CM

## 2016-08-13 ENCOUNTER — Other Ambulatory Visit: Payer: Self-pay | Admitting: *Deleted

## 2016-08-13 MED ORDER — POTASSIUM CHLORIDE CRYS ER 20 MEQ PO TBCR: 20.0000 meq | EXTENDED_RELEASE_TABLET | Freq: Two times a day (BID) | ORAL | 1 refills | Status: DC

## 2016-08-21 ENCOUNTER — Ambulatory Visit
Admission: RE | Admit: 2016-08-21 | Discharge: 2016-08-21 | Disposition: A | Discharge status: Home / Self Care | Payer: Commercial Managed Care - HMO | Source: Ambulatory Visit | Attending: Cardiothoracic Surgery | Admitting: Cardiothoracic Surgery

## 2016-08-21 ENCOUNTER — Ambulatory Visit (INDEPENDENT_AMBULATORY_CARE_PROVIDER_SITE_OTHER): Payer: Commercial Managed Care - HMO | Admitting: Cardiothoracic Surgery

## 2016-08-21 VITALS — BP 130/74 | HR 78 | Resp 20 | Ht 60.0 in | Wt 126.0 lb

## 2016-08-21 DIAGNOSIS — R911 Solitary pulmonary nodule: Secondary | ICD-10-CM

## 2016-08-21 DIAGNOSIS — D381 Neoplasm of uncertain behavior of trachea, bronchus and lung: Secondary | ICD-10-CM | POA: Diagnosis not present

## 2016-08-21 MED ORDER — IOPAMIDOL (ISOVUE-300) INJECTION 61%
75.0000 mL | Freq: Once | INTRAVENOUS | Status: AC | PRN
  Administered 2016-08-21: 75 mL via INTRAVENOUS

## 2016-08-21 NOTE — Progress Notes (Signed)
PCP is Arnette Norris, MD Referring Provider is Lucille Passy, MD  Chief Complaint  Patient presents with  . Lung Lesion    6 month f/u with Chest CT   Patient returns for follow-up of a 12 mm groundglass density of the left lower lobe previously evaluated with PET CT scan. The area had minimal activity on PET scan and it was decided to follow the density in this 70 year old nonsmoker due to the low risk for malignancy. The patient returns with repeat CT scan. She denies any pulmonary symptoms. The left lower lobe groundglass density has decreased from 12 mm to 8 mm in size and is significantly smaller in area. No other new areas of concern. An incidental finding on the current CT scan is a 7 mm cystic area in the left lobe of the thyroid gland which is new from her previous scan.  I discussed the current CT scan with the patient and her husband including the reassurance that the density has decreased in size is a strong indicator that this is not malignant. We will repeat the scan in 6 months to confirm that it is resolved or continues to regress.  The cystic area in the left lobe of thyroid which is new will berelayed to her primary care physician for further evaluation.  Past Medical History:  Diagnosis Date  . Allergic rhinitis   . Hyperlipidemia   . Hypertension     Past Surgical History:  Procedure Laterality Date  . gyn surgery  2002   hysterectomy    Family History  Problem Relation Age of Onset  . Heart attack Brother   . Hypertension Brother   . Hypertension Father   . Hypertension Mother   . Hypertension Sister     Social History Social History  Substance Use Topics  . Smoking status: Never Smoker  . Smokeless tobacco: Never Used  . Alcohol use No    Current Outpatient Prescriptions  Medication Sig Dispense Refill  . ALPRAZolam (XANAX) 0.25 MG tablet Take 1 tablet (0.25 mg total) by mouth 2 (two) times daily as needed for anxiety or sleep. 30 tablet 0  .  atorvastatin (LIPITOR) 10 MG tablet TAKE 1 TABLET (10 MG TOTAL) BY MOUTH DAILY. 90 tablet 2  . cetirizine (ZYRTEC) 10 MG tablet TAKE 1 TABLET (10 MG TOTAL) BY MOUTH DAILY. 90 tablet 0  . cyclobenzaprine (FLEXERIL) 5 MG tablet Take 1 tablet (5 mg total) by mouth 3 (three) times daily as needed for muscle spasms. 60 tablet 1  . hydrochlorothiazide (HYDRODIURIL) 25 MG tablet TAKE 1 TABLET (25 MG TOTAL) BY MOUTH DAILY. 90 tablet 2  . NORVASC 5 MG tablet TAKE 1 TO 2 TABLETS EVERY DAY 90 tablet 1  . potassium chloride SA (KLOR-CON M20) 20 MEQ tablet Take 1 tablet (20 mEq total) by mouth 2 (two) times daily. 180 tablet 1   No current facility-administered medications for this visit.     Allergies  Allergen Reactions  . Lisinopril     REACTION: cough    Review of Systems  No fever weight loss No shortness of breath productive cough Neck still requires physical therapy 3 times a week. She has elected not to undergo neurosurgery No anginal chest pain palpitations or orthopnea No abdominal pain or jaundice No dysuria or hematuria No ankle swelling or tenderness  BP 130/74   Pulse 78   Resp 20   Ht 5' (1.524 m)   Wt 126 lb (57.2 kg)   SpO2  98% Comment: RA  BMI 24.61 kg/m  Physical Exam      Exam    General- alert and comfortable   Lungs- clear without rales, wheezes   Cor- regular rate and rhythm, no murmur , gallop   Abdomen- soft, non-tender   Extremities - warm, non-tender, minimal edema   Neuro- oriented, appropriate, no focal weakness   Diagnostic Tests: CT scan personally reviewed and results counseled with patient and husband  Impression: Regression size of left lower lobe groundglass density from 12 mm to 8 mm. This is reassuring that this is not a malignancy.  CT scan today shows a 7 mm cystic area on the left lobe of the thyroid gland which was not present at the previous scan.  Plan: Repeat CT scan in 6 months to assure resolution or continued regression of this  left lower lobe density.   Len Childs, MD [facility] [phone]

## 2016-09-17 ENCOUNTER — Encounter: Payer: Self-pay | Admitting: Family Medicine

## 2016-09-17 ENCOUNTER — Ambulatory Visit (INDEPENDENT_AMBULATORY_CARE_PROVIDER_SITE_OTHER): Payer: Commercial Managed Care - HMO | Admitting: Family Medicine

## 2016-09-17 VITALS — BP 148/82 | HR 115 | Temp 97.8°F | Wt 128.0 lb

## 2016-09-17 DIAGNOSIS — F410 Panic disorder [episodic paroxysmal anxiety] without agoraphobia: Secondary | ICD-10-CM

## 2016-09-17 DIAGNOSIS — E7800 Pure hypercholesterolemia, unspecified: Secondary | ICD-10-CM | POA: Diagnosis not present

## 2016-09-17 DIAGNOSIS — I10 Essential (primary) hypertension: Secondary | ICD-10-CM | POA: Diagnosis not present

## 2016-09-17 DIAGNOSIS — E876 Hypokalemia: Secondary | ICD-10-CM

## 2016-09-17 DIAGNOSIS — R911 Solitary pulmonary nodule: Secondary | ICD-10-CM | POA: Diagnosis not present

## 2016-09-17 DIAGNOSIS — F430 Acute stress reaction: Secondary | ICD-10-CM

## 2016-09-17 LAB — COMPREHENSIVE METABOLIC PANEL
ALT: 14 U/L (ref 0–35)
AST: 17 U/L (ref 0–37)
Albumin: 4.5 g/dL (ref 3.5–5.2)
Alkaline Phosphatase: 75 U/L (ref 39–117)
BUN: 13 mg/dL (ref 6–23)
CO2: 32 mEq/L (ref 19–32)
Calcium: 10.2 mg/dL (ref 8.4–10.5)
Chloride: 100 mEq/L (ref 96–112)
Creatinine, Ser: 0.8 mg/dL (ref 0.40–1.20)
GFR: 75.23 mL/min (ref >60.00)
Glucose, Bld: 109 mg/dL — ABNORMAL HIGH (ref 70–99)
Potassium: 3.8 mEq/L (ref 3.5–5.1)
Sodium: 139 mEq/L (ref 135–145)
Total Bilirubin: 0.6 mg/dL (ref 0.2–1.2)
Total Protein: 8.3 g/dL (ref 6.0–8.3)

## 2016-09-17 LAB — LIPID PANEL
Cholesterol: 163 mg/dL (ref 0–200)
HDL: 38.7 mg/dL — ABNORMAL LOW (ref >39.00)
LDL Cholesterol: 94 mg/dL (ref 0–99)
NonHDL: 124.19
Total CHOL/HDL Ratio: 4
Triglycerides: 150 mg/dL — ABNORMAL HIGH (ref 0.0–149.0)
VLDL: 30 mg/dL (ref 0.0–40.0)

## 2016-09-17 MED ORDER — ALPRAZOLAM 0.25 MG PO TABS: 0.2500 mg | ORAL_TABLET | Freq: Two times a day (BID) | ORAL | 0 refills | Status: DC | PRN

## 2016-09-17 MED ORDER — CYCLOBENZAPRINE HCL 5 MG PO TABS: 5.0000 mg | ORAL_TABLET | Freq: Three times a day (TID) | ORAL | 1 refills | Status: DC | PRN

## 2016-09-17 NOTE — Progress Notes (Signed)
Subjective:  .  Anxiety- Has been well controlled.  Rarely uses- maybe once every 3 weeks.  Asking for a refill today.   Current Outpatient Prescriptions on File Prior to Visit  Medication Sig Dispense Refill  . atorvastatin (LIPITOR) 10 MG tablet TAKE 1 TABLET (10 MG TOTAL) BY MOUTH DAILY. 90 tablet 2  . cetirizine (ZYRTEC) 10 MG tablet TAKE 1 TABLET (10 MG TOTAL) BY MOUTH DAILY. 90 tablet 0  . hydrochlorothiazide (HYDRODIURIL) 25 MG tablet TAKE 1 TABLET (25 MG TOTAL) BY MOUTH DAILY. 90 tablet 2  . NORVASC 5 MG tablet TAKE 1 TO 2 TABLETS EVERY DAY 90 tablet 1  . potassium chloride SA (KLOR-CON M20) 20 MEQ tablet Take 1 tablet (20 mEq total) by mouth 2 (two) times daily. 180 tablet 1  . [DISCONTINUED] potassium chloride 20 MEQ TBCR Take 10 mEq by mouth 2 (two) times daily. 30 tablet 0   No current facility-administered medications on file prior to visit.     Allergies  Allergen Reactions  . Lisinopril     REACTION: cough    Past Medical History:  Diagnosis Date  . Allergic rhinitis   . Hyperlipidemia   . Hypertension     Past Surgical History:  Procedure Laterality Date  . gyn surgery  2002   hysterectomy    Family History  Problem Relation Age of Onset  . Heart attack Brother   . Hypertension Brother   . Hypertension Father   . Hypertension Mother   . Hypertension Sister     Social History   Social History  . Marital status: Married    Spouse name: N/A  . Number of children: 1  . Years of education: N/A   Occupational History  . Not on file.   Social History Main Topics  . Smoking status: Never Smoker  . Smokeless tobacco: Never Used  . Alcohol use No  . Drug use: No  . Sexual activity: Not on file   Other Topics Concern  . Not on file   Social History Narrative  . No narrative on file     Current Outpatient Prescriptions on File Prior to Visit  Medication Sig Dispense Refill  . atorvastatin (LIPITOR) 10 MG tablet TAKE 1 TABLET (10 MG  TOTAL) BY MOUTH DAILY. 90 tablet 2  . cetirizine (ZYRTEC) 10 MG tablet TAKE 1 TABLET (10 MG TOTAL) BY MOUTH DAILY. 90 tablet 0  . cyclobenzaprine (FLEXERIL) 5 MG tablet Take 1 tablet (5 mg total) by mouth 3 (three) times daily as needed for muscle spasms. 60 tablet 1  . hydrochlorothiazide (HYDRODIURIL) 25 MG tablet TAKE 1 TABLET (25 MG TOTAL) BY MOUTH DAILY. 90 tablet 2  . NORVASC 5 MG tablet TAKE 1 TO 2 TABLETS EVERY DAY 90 tablet 1  . potassium chloride SA (KLOR-CON M20) 20 MEQ tablet Take 1 tablet (20 mEq total) by mouth 2 (two) times daily. 180 tablet 1  . [DISCONTINUED] potassium chloride 20 MEQ TBCR Take 10 mEq by mouth 2 (two) times daily. 30 tablet 0   No current facility-administered medications on file prior to visit.     Allergies  Allergen Reactions  . Lisinopril     REACTION: cough    Past Medical History:  Diagnosis Date  . Allergic rhinitis   . Hyperlipidemia   . Hypertension     Past Surgical History:  Procedure Laterality Date  . gyn surgery  2002   hysterectomy    Family History  Problem Relation  Age of Onset  . Heart attack Brother   . Hypertension Brother   . Hypertension Father   . Hypertension Mother   . Hypertension Sister     Social History   Social History  . Marital status: Married    Spouse name: N/A  . Number of children: 1  . Years of education: N/A   Occupational History  . Not on file.   Social History Main Topics  . Smoking status: Never Smoker  . Smokeless tobacco: Never Used  . Alcohol use No  . Drug use: No  . Sexual activity: Not on file   Other Topics Concern  . Not on file   Social History Narrative  . No narrative on file   The PMH, PSH, Social History, Family History, Medications, and allergies have been reviewed in [facility], and have been updated if relevant.   Review of Systems  Psychiatric/Behavioral: Negative for agitation, decreased concentration, dysphoric mood, hallucinations, self-injury and sleep  disturbance. The patient is nervous/anxious. The patient is not hyperactive.   All other systems reviewed and are negative.      Objective:    BP (!) 148/82   Pulse (!) 115   Temp 97.8 F (36.6 C) (Oral)   Wt 128 lb (58.1 kg)   SpO2 98%   BMI 25.00 kg/m    Physical Exam  Constitutional: She is oriented to person, place, and time. She appears well-developed and well-nourished. No distress.  HENT:  Head: Normocephalic.  Eyes: Conjunctivae are normal.  Cardiovascular: Normal rate and regular rhythm.   Pulmonary/Chest: Effort normal and breath sounds normal.  Musculoskeletal: Normal range of motion.  Neurological: She is alert and oriented to person, place, and time. No cranial nerve deficit.  Skin: Skin is warm and dry. She is not diaphoretic.  Psychiatric: She has a normal mood and affect. Her behavior is normal. Judgment and thought content normal.  Nursing note reviewed.         Assessment & Plan:   Pure hypercholesterolemia - Plan: Comprehensive metabolic panel, Lipid panel  Essential hypertension  Hypokalemia  Lung nodule seen on imaging study  Panic attack as reaction to stress No Follow-up on file.

## 2016-09-17 NOTE — Assessment & Plan Note (Signed)
>  15 minutes spent in face to face time with patient, >50% spent in counselling or coordination of care Well controlled. Rx for xanax printed and given to

## 2016-09-17 NOTE — Progress Notes (Signed)
Pre visit review using our clinic review tool, if applicable. No additional management support is needed unless otherwise documented below in the visit note. 

## 2016-09-20 ENCOUNTER — Encounter: Payer: Self-pay | Admitting: *Deleted

## 2016-10-21 ENCOUNTER — Encounter: Payer: Self-pay | Admitting: Family Medicine

## 2016-10-21 ENCOUNTER — Ambulatory Visit (INDEPENDENT_AMBULATORY_CARE_PROVIDER_SITE_OTHER): Payer: Commercial Managed Care - HMO | Admitting: Family Medicine

## 2016-10-21 DIAGNOSIS — B029 Zoster without complications: Secondary | ICD-10-CM

## 2016-10-21 MED ORDER — GABAPENTIN 100 MG PO CAPS: 100.0000 mg | ORAL_CAPSULE | Freq: Three times a day (TID) | ORAL | 1 refills | Status: DC

## 2016-10-21 MED ORDER — VALACYCLOVIR HCL 1 G PO TABS: 1000.0000 mg | ORAL_TABLET | Freq: Three times a day (TID) | ORAL | 0 refills | Status: DC

## 2016-10-21 NOTE — Progress Notes (Signed)
Pre visit review using our clinic review tool, if applicable. No additional management support is needed unless otherwise documented below in the visit note. 

## 2016-10-21 NOTE — Patient Instructions (Signed)
Shingles Take valtrex 3 times a day.   Then take gabapentin for pain.  Start with 1 pill at a time and gradually increase up to 3 tabs at a time if needed.   Take care.  Glad to see you.

## 2016-10-21 NOTE — Progress Notes (Signed)
Recently with rash and pain on the chest wall.  She thought it was just a muscle ache initially.  Tylenol didn't help.  Then more burning on the L side.  Eventually with rash on the L chest wall.    Hasn't had the shingles shot yet.  D/w pt.    Meds, vitals, and allergies reviewed.   ROS: Per HPI unless specifically indicated in ROS section   nad ncat Dermatomal rash on the L chest wall with altered sensation, with blistering lesions.

## 2016-10-22 ENCOUNTER — Other Ambulatory Visit: Payer: Self-pay | Admitting: Family Medicine

## 2016-10-22 DIAGNOSIS — B028 Zoster with other complications: Secondary | ICD-10-CM | POA: Insufficient documentation

## 2016-10-22 NOTE — Assessment & Plan Note (Signed)
Painful, but apparently uncomplicated. Start Valtrex. Discussed with patient about routine cautions for gabapentin. Start with 100 mg and gradually titrate up as tolerated as needed for pain. Shingles pathophysiology discussed patient. Okay for outpatient follow-up.

## 2016-10-27 ENCOUNTER — Emergency Department
Admission: EM | Admit: 2016-10-27 | Discharge: 2016-10-28 | Disposition: A | Discharge status: Home / Self Care | Payer: Commercial Managed Care - HMO | Attending: Emergency Medicine | Admitting: Emergency Medicine

## 2016-10-27 ENCOUNTER — Emergency Department: Payer: Commercial Managed Care - HMO

## 2016-10-27 DIAGNOSIS — R21 Rash and other nonspecific skin eruption: Secondary | ICD-10-CM | POA: Diagnosis not present

## 2016-10-27 DIAGNOSIS — L509 Urticaria, unspecified: Secondary | ICD-10-CM | POA: Diagnosis not present

## 2016-10-27 DIAGNOSIS — L500 Allergic urticaria: Secondary | ICD-10-CM | POA: Diagnosis not present

## 2016-10-27 DIAGNOSIS — R0602 Shortness of breath: Secondary | ICD-10-CM | POA: Diagnosis not present

## 2016-10-27 DIAGNOSIS — Z79899 Other long term (current) drug therapy: Secondary | ICD-10-CM | POA: Insufficient documentation

## 2016-10-27 DIAGNOSIS — T7840XA Allergy, unspecified, initial encounter: Secondary | ICD-10-CM

## 2016-10-27 DIAGNOSIS — I10 Essential (primary) hypertension: Secondary | ICD-10-CM | POA: Diagnosis not present

## 2016-10-27 DIAGNOSIS — T438X5A Adverse effect of other psychotropic drugs, initial encounter: Secondary | ICD-10-CM | POA: Diagnosis not present

## 2016-10-27 LAB — CBC WITH DIFFERENTIAL/PLATELET
Basophils Absolute: 0 10*3/uL (ref 0–0.1)
Basophils Relative: 0 %
Eosinophils Absolute: 0.3 10*3/uL (ref 0–0.7)
Eosinophils Relative: 4 %
HCT: 39.4 % (ref 35.0–47.0)
Hemoglobin: 13.8 g/dL (ref 12.0–16.0)
Lymphocytes Relative: 50 %
Lymphs Abs: 4.2 10*3/uL — ABNORMAL HIGH (ref 1.0–3.6)
MCH: 28.7 pg (ref 26.0–34.0)
MCHC: 35 g/dL (ref 32.0–36.0)
MCV: 81.8 fL (ref 80.0–100.0)
Monocytes Absolute: 0.3 10*3/uL (ref 0.2–0.9)
Monocytes Relative: 3 %
Neutro Abs: 3.7 10*3/uL (ref 1.4–6.5)
Neutrophils Relative %: 43 %
Platelets: 295 10*3/uL (ref 150–440)
RBC: 4.82 MIL/uL (ref 3.80–5.20)
RDW: 12.6 % (ref 11.5–14.5)
WBC: 8.5 10*3/uL (ref 3.6–11.0)

## 2016-10-27 LAB — TROPONIN I: Troponin I: <0.03 ng/mL (ref <0.03)

## 2016-10-27 LAB — COMPREHENSIVE METABOLIC PANEL
ALT: 17 U/L (ref 14–54)
AST: 22 U/L (ref 15–41)
Albumin: 3.9 g/dL (ref 3.5–5.0)
Alkaline Phosphatase: 76 U/L (ref 38–126)
Anion gap: 11 (ref 5–15)
BUN: 14 mg/dL (ref 6–20)
CO2: 27 mmol/L (ref 22–32)
Calcium: 8.8 mg/dL — ABNORMAL LOW (ref 8.9–10.3)
Chloride: 91 mmol/L — ABNORMAL LOW (ref 101–111)
Creatinine, Ser: 0.86 mg/dL (ref 0.44–1.00)
GFR calc Af Amer: >60 mL/min (ref >60)
GFR calc non Af Amer: >60 mL/min (ref >60)
Glucose, Bld: 120 mg/dL — ABNORMAL HIGH (ref 65–99)
Potassium: 2.6 mmol/L — CL (ref 3.5–5.1)
Sodium: 129 mmol/L — ABNORMAL LOW (ref 135–145)
Total Bilirubin: 0.7 mg/dL (ref 0.3–1.2)
Total Protein: 7.8 g/dL (ref 6.5–8.1)

## 2016-10-27 MED ORDER — HYDROCODONE-ACETAMINOPHEN 5-325 MG PO TABS
1.0000 | ORAL_TABLET | Freq: Once | ORAL | Status: AC
  Administered 2016-10-27: 1 via ORAL

## 2016-10-27 MED ORDER — HYDROCODONE-ACETAMINOPHEN 5-325 MG PO TABS: 1.0000 | ORAL_TABLET | ORAL | 0 refills | Status: DC | PRN

## 2016-10-27 MED ORDER — POTASSIUM CHLORIDE CRYS ER 20 MEQ PO TBCR
20.0000 meq | EXTENDED_RELEASE_TABLET | Freq: Once | ORAL | Status: AC
  Administered 2016-10-27: 20 meq via ORAL

## 2016-10-27 MED ORDER — METHYLPREDNISOLONE SODIUM SUCC 125 MG IJ SOLR
125.0000 mg | Freq: Once | INTRAMUSCULAR | Status: AC
  Administered 2016-10-27: 125 mg via INTRAVENOUS
  Filled 2016-10-27: qty 2

## 2016-10-27 MED ORDER — DIPHENHYDRAMINE HCL 50 MG/ML IJ SOLN
25.0000 mg | Freq: Once | INTRAMUSCULAR | Status: AC
  Administered 2016-10-27: 25 mg via INTRAVENOUS
  Filled 2016-10-27: qty 1

## 2016-10-27 MED ORDER — POTASSIUM CHLORIDE CRYS ER 20 MEQ PO TBCR
EXTENDED_RELEASE_TABLET | ORAL | Status: AC
  Administered 2016-10-27: 20 meq via ORAL
  Filled 2016-10-27: qty 1

## 2016-10-27 MED ORDER — FAMOTIDINE IN NACL 20-0.9 MG/50ML-% IV SOLN
20.0000 mg | Freq: Once | INTRAVENOUS | Status: AC
  Administered 2016-10-27: 20 mg via INTRAVENOUS
  Filled 2016-10-27: qty 50

## 2016-10-27 MED ORDER — POTASSIUM CHLORIDE 20 MEQ PO PACK
PACK | ORAL | Status: AC
  Administered 2016-10-27: 20 meq via ORAL
  Filled 2016-10-27: qty 1

## 2016-10-27 MED ORDER — POTASSIUM CHLORIDE 20 MEQ PO PACK
20.0000 meq | PACK | Freq: Two times a day (BID) | ORAL | Status: DC
  Administered 2016-10-27: 20 meq via ORAL

## 2016-10-27 MED ORDER — PREDNISONE 50 MG PO TABS: 50.0000 mg | ORAL_TABLET | Freq: Every day | ORAL | 0 refills | Status: DC

## 2016-10-27 MED ORDER — HYDROCODONE-ACETAMINOPHEN 5-325 MG PO TABS
ORAL_TABLET | ORAL | Status: AC
  Administered 2016-10-27: 1 via ORAL
  Filled 2016-10-27: qty 1

## 2016-10-27 NOTE — ED Provider Notes (Signed)
[facility] Emergency Department Provider Note   ____________________________________________    I have reviewed the triage vital signs and the nursing notes.   HISTORY  Chief Complaint Allergic Reaction     HPI Robin Peck is a 71 y.o. female who presents with complaints of rash and shortness of breath. Patient reports recent diagnosis of shingles, was put on gabapentin and since taking that medication developed a diffuse and rapidly changing red itchy rash. She also complains of mild shortness of breath. No throat swelling noted. She did note a fever last night but none today. No cough. No calf pain or swelling. No history of anaphylaxis   Past Medical History:  Diagnosis Date  . Allergic rhinitis   . Hyperlipidemia   . Hypertension     Patient Active Problem List   Diagnosis Date Noted  . Shingles 10/22/2016  . Cervical pain (neck) 03/11/2016  . Lung nodule seen on imaging study 08/31/2015  . Panic attack as reaction to stress 06/01/2015  . Hypokalemia 08/02/2014  . DJD (degenerative joint disease) of cervical spine 02/17/2013  . HLD (hyperlipidemia) 11/08/2010  . Essential hypertension 11/08/2010  . ALLERGIC RHINITIS 11/08/2010    Past Surgical History:  Procedure Laterality Date  . gyn surgery  2002   hysterectomy    Prior to Admission medications   Medication Sig Start Date End Date Taking? Authorizing Provider  ALPRAZolam (XANAX) 0.25 MG tablet Take 1 tablet (0.25 mg total) by mouth 2 (two) times daily as needed for anxiety or sleep. 09/17/16   Lucille Passy, MD  atorvastatin (LIPITOR) 10 MG tablet TAKE 1 TABLET (10 MG TOTAL) BY MOUTH DAILY. 01/23/16   Lucille Passy, MD  cetirizine (ZYRTEC) 10 MG tablet TAKE 1 TABLET (10 MG TOTAL) BY MOUTH DAILY. 04/29/14   Lucille Passy, MD  cyclobenzaprine (FLEXERIL) 5 MG tablet Take 1 tablet (5 mg total) by mouth 3 (three) times daily as needed for muscle spasms. 09/17/16   Lucille Passy, MD    gabapentin (NEURONTIN) 100 MG capsule Take 1-3 capsules (100-300 mg total) by mouth 3 (three) times daily. 10/21/16   Tonia Ghent, MD  hydrochlorothiazide (HYDRODIURIL) 25 MG tablet TAKE 1 TABLET (25 MG TOTAL) BY MOUTH DAILY. 01/23/16   Lucille Passy, MD  NORVASC 5 MG tablet TAKE 1 TO 2 TABLETS EVERY DAY 04/16/16   Lucille Passy, MD  NORVASC 5 MG tablet TAKE 1-2 TABLETS BY MOUTH EVERY DAY 10/23/16   Lucille Passy, MD  potassium chloride SA (KLOR-CON M20) 20 MEQ tablet Take 1 tablet (20 mEq total) by mouth 2 (two) times daily. 08/13/16   Lucille Passy, MD  valACYclovir (VALTREX) 1000 MG tablet Take 1 tablet (1,000 mg total) by mouth 3 (three) times daily. 10/21/16   Tonia Ghent, MD     Allergies Lisinopril  Family History  Problem Relation Age of Onset  . Heart attack Brother   . Hypertension Brother   . Hypertension Father   . Hypertension Mother   . Hypertension Sister     Social History Social History  Substance Use Topics  . Smoking status: Never Smoker  . Smokeless tobacco: Never Used  . Alcohol use No    Review of Systems  Constitutional: As above, no dizziness Eyes: No visual changes.  ENT: No throat swelling Cardiovascular: Denies chest pain. Respiratory: As above Gastrointestinal: No abdominal pain.  No nausea, no vomiting.   Genitourinary: Negative for dysuria. Musculoskeletal:  Negative for myalgias Skin: As above Neurological: Negative for headaches or weakness  10-point ROS otherwise negative.  ____________________________________________   PHYSICAL EXAM:  VITAL SIGNS: ED Triage Vitals [10/27/16 1925]  Enc Vitals Group     BP 137/68     Pulse Rate 94     Resp (!) 26     Temp      Temp src      SpO2 100 %     Weight      Height 5' (1.524 m)     Head Circumference      Peak Flow      Pain Score      Pain Loc      Pain Edu?      Excl. in [place]?     Constitutional: Alert and oriented. No acute distress.  Eyes: Conjunctivae are normal.   Nose:  No congestion/rhinnorhea. Mouth/Throat: Mucous membranes are moist.  Pharynx is normal, no swelling  Cardiovascular: Normal rate, regular rhythm. Grossly normal heart sounds.  Good peripheral circulation. Respiratory: Normal respiratory effort.  No retractions. Lungs CTAB. No wheezing Gastrointestinal: Soft and nontender. No distention.  No CVA tenderness. Genitourinary: deferred Musculoskeletal:  Warm and well perfused Neurologic:  Normal speech and language. No gross focal neurologic deficits are appreciated.  Skin:  Skin is warm, dry and intact. Diffuse erythematous rash most consistent with urticaria. Also with shingles rash in various stages of healing on the left back and under the left arm Psychiatric: Mood and affect are normal. Speech and behavior are normal.  ____________________________________________   LABS (all labs ordered are listed, but only abnormal results are displayed)  Labs Reviewed  CBC WITH DIFFERENTIAL/PLATELET - Abnormal; Notable for the following:       Result Value   Lymphs Abs 4.2 (*)    All other components within normal limits  COMPREHENSIVE METABOLIC PANEL - Abnormal; Notable for the following:    Sodium 129 (*)    Potassium 2.6 (*)    Chloride 91 (*)    Glucose, Bld 120 (*)    Calcium 8.8 (*)    All other components within normal limits  TROPONIN I   ____________________________________________  EKG  ED ECG REPORT I, Lavonia Drafts, the attending physician, personally viewed and interpreted this ECG.  Date: 10/27/2016 EKG Time: 8:08 PM Rate: 80 Rhythm: normal sinus rhythm QRS Axis: normal Intervals: normal ST/T Wave abnormalities: normal Conduction Disturbances: none Narrative Interpretation: unremarkable  ____________________________________________  RADIOLOGY  Chest x-ray no acute distress ____________________________________________   PROCEDURES  Procedure(s) performed: No    Critical Care performed:  No ____________________________________________   INITIAL IMPRESSION / ASSESSMENT AND PLAN / ED COURSE  Pertinent labs & imaging results that were available during my care of the patient were reviewed by me and considered in my medical decision making (see chart for details).  Patient's presentation is concerning for allergic reaction, likely related to gabapentin. We will give Benadryl, Solu-Medrol, Pepcid and also check labs, chest x-ray given the description of possible fever and shortness of breath.  Lab work is reassuring, chest x-ray is normal. EKG and troponin normal.  ----------------------------------------- 10:54 PM on 10/27/2016 -----------------------------------------  Patient reports she feels much better, itching has improved significantly although she still has erythematous rash. We will give Vicodin for pain from her shingles and continue to monitor.    ____________________________________________   FINAL CLINICAL IMPRESSION(S) / ED DIAGNOSES  Final diagnoses:  Allergic reaction, initial encounter  Urticaria      NEW MEDICATIONS  STARTED DURING THIS VISIT:  New Prescriptions   No medications on file     Note:  This document was prepared using Dragon voice recognition software and may include unintentional dictation errors.    Lavonia Drafts, MD 10/27/16 2255

## 2016-10-27 NOTE — ED Triage Notes (Signed)
Reports diagnoses with shingles on Tuesday.  Given Gabapentin for pain reports yesterday started with rash and short of breath and concerned for allergic reaction.

## 2016-10-27 NOTE — ED Notes (Signed)
CRITICAL LAB: Potassium is 2.6, [employer], Dr. Corky Downs notified, orders received

## 2016-11-01 ENCOUNTER — Encounter: Payer: Self-pay | Admitting: Family Medicine

## 2016-11-01 ENCOUNTER — Ambulatory Visit (INDEPENDENT_AMBULATORY_CARE_PROVIDER_SITE_OTHER): Payer: Commercial Managed Care - HMO | Admitting: Family Medicine

## 2016-11-01 VITALS — BP 128/68 | HR 101 | Temp 98.3°F | Resp 16 | Ht 60.0 in | Wt 126.8 lb

## 2016-11-01 DIAGNOSIS — B029 Zoster without complications: Secondary | ICD-10-CM

## 2016-11-01 DIAGNOSIS — E876 Hypokalemia: Secondary | ICD-10-CM | POA: Diagnosis not present

## 2016-11-01 LAB — BASIC METABOLIC PANEL
BUN: 15 mg/dL (ref 7–25)
CO2: 27 mmol/L (ref 20–31)
Calcium: 9.6 mg/dL (ref 8.6–10.4)
Chloride: 99 mmol/L (ref 98–110)
Creat: 0.82 mg/dL (ref 0.60–0.93)
Glucose, Bld: 139 mg/dL — ABNORMAL HIGH (ref 65–99)
Potassium: 4.2 mmol/L (ref 3.5–5.3)
Sodium: 137 mmol/L (ref 135–146)

## 2016-11-01 MED ORDER — PREGABALIN 50 MG PO CAPS: 50.0000 mg | ORAL_CAPSULE | Freq: Two times a day (BID) | ORAL | 0 refills | Status: DC

## 2016-11-01 MED ORDER — LIDOCAINE 5 % EX PTCH: 1.0000 | MEDICATED_PATCH | CUTANEOUS | 0 refills | Status: DC

## 2016-11-01 MED ORDER — HYDROCODONE-ACETAMINOPHEN 5-325 MG PO TABS: 1.0000 | ORAL_TABLET | ORAL | 0 refills | Status: DC | PRN

## 2016-11-01 NOTE — Patient Instructions (Signed)
Keep taking benadryl as needed for itchy.  Use the patches for the pain.  You may need to take lyrica for pain.  Start with 1 tab a day, okay to increase to 1 tab twice a day.  Go to the lab on the way out.  We'll contact you with your lab report. Take care.  Glad to see you.  Please update me Monday.

## 2016-11-01 NOTE — Progress Notes (Signed)
After the last OV, she had episodic fevers.  She went to ER with a new rash.  She was SOB.  The rash itched.  Clearly different from shingles rash.  No new changes other than the valtrex and gabapentin.  No lip or tongue swelling.  The rash seemed to flare up after taking each dose of gabapentin.  She was diagnosed with an allergy to gabapentin. Discussed with patient at office visit. I've never seen this type of allergic reaction to this medication.  Also noted that her potassium was low at ER.    Her SOB was clearly better with IV benadryl at ER.    She was discharged home and has stopped gabapentin in the meantime.  She just finished prednisone course today.  Still taking PO benadryl for itching as needed.   She is still in pain from shingles in spite of vicodin use.    Meds, vitals, and allergies reviewed.   ROS: Per HPI unless specifically indicated in ROS section   nad ncat mmm No lip or tongue swelling.  No stridor. ctab rrr Abd soft, normal BS Ext w/o edema Shingles rash still present in previously described dermatomal distribution. She has a faint blanching irregular macular rash on the trunk and extremities. It is not noted on the palms. This is clearly different from her shingles rash

## 2016-11-01 NOTE — Progress Notes (Signed)
Pre-visit discussion using our clinic review tool. No additional management support is needed unless otherwise documented below in the visit note.  

## 2016-11-03 NOTE — Assessment & Plan Note (Signed)
She continues to have pain form shingles. Vicodin is not helping. It is presumed that she had an allergic reaction to the gabapentin. Discussed with patient. I had no expectation for her to have this kind of reaction. She understood that. She asked about other medications. She can try topical lidocaine patches. If that is not effective, then she can try Lyrica at a low dose and gradually escalate if needed and tolerated. Discussed with patient about medication allergies in general. It is unlikely that she would have an allergic reaction to lidocaine or Lyrica. If she did, it would be unrelated to the previous issue that she had with gabapentin. She understood that. No airway symptoms at this point. No lip or tongue swelling. Okay for outpatient follow-up. All discussed with patient and she agreed.  She also has hypokalemia recently noted. Recheck labs pending. >25 minutes spent in face to face time with patient, >50% spent in counselling or coordination of care

## 2016-11-15 ENCOUNTER — Other Ambulatory Visit: Payer: Self-pay | Admitting: Family Medicine

## 2016-11-19 ENCOUNTER — Ambulatory Visit (INDEPENDENT_AMBULATORY_CARE_PROVIDER_SITE_OTHER): Payer: Commercial Managed Care - HMO | Admitting: Family Medicine

## 2016-11-19 VITALS — BP 140/76 | HR 88 | Temp 97.6°F | Ht 60.0 in | Wt 126.0 lb

## 2016-11-19 DIAGNOSIS — B029 Zoster without complications: Secondary | ICD-10-CM | POA: Diagnosis not present

## 2016-11-19 DIAGNOSIS — E0789 Other specified disorders of thyroid: Secondary | ICD-10-CM

## 2016-11-19 DIAGNOSIS — E079 Disorder of thyroid, unspecified: Secondary | ICD-10-CM

## 2016-11-19 DIAGNOSIS — E042 Nontoxic multinodular goiter: Secondary | ICD-10-CM | POA: Insufficient documentation

## 2016-11-19 MED ORDER — PREGABALIN 75 MG PO CAPS: 75.0000 mg | ORAL_CAPSULE | Freq: Two times a day (BID) | ORAL | 3 refills | Status: DC

## 2016-11-19 MED ORDER — HYDROCODONE-ACETAMINOPHEN 10-325 MG PO TABS: 1.0000 | ORAL_TABLET | Freq: Four times a day (QID) | ORAL | 0 refills | Status: DC | PRN

## 2016-11-19 NOTE — Assessment & Plan Note (Signed)
Persistent pain. Concerning for PHN. Will increase Lyrica to 75 mg twice daily and increase dose of vicodin. Discussed sedation precautions. Call or return to clinic prn if these symptoms worsen or fail to improve as anticipated. The patient indicates understanding of these issues and agrees with the plan.

## 2016-11-19 NOTE — Progress Notes (Signed)
Subjective:   Patient ID: Robin Peck, female    DOB: 10-Jan-1946, 71 y.o.   MRN: [id number]  Robin Peck is a pleasant 71 y.o. year old female who presents to clinic today with Herpes Zoster (Pt stated stated having pain/ burning and not getting any better.)  on 11/19/2016  HPI:  Saw my partner, Dr. Damita Dunnings on 10/21/16 for a new rash. Note reviewed. Diagnosed with shingles and started on valtrex and gabapentin.  Went to ER on 10/27/16- note reviewed- for new, itchy rash and SOB after taking each dose of Gabapentin. Diagnosed with allergy to gabapentin. Given IV benadryl in the ER, prednisone and advised to stop taking gabapentin.  Followed up again with Dr Damita Dunnings on 11/01/16- note reviewed. She was having persistent shingles pain despite which was refractory to Vicodin.  Given rxs for lyrica 50 mg twice daily and lidocaine patches. She does feel the lyrica has helped some.  Has not used the lidocaine patches as her rash has not fully scabbed over.  She is still having persistent pain.  Current Outpatient Prescriptions on File Prior to Visit  Medication Sig Dispense Refill  . ALPRAZolam (XANAX) 0.25 MG tablet Take 1 tablet (0.25 mg total) by mouth 2 (two) times daily as needed for anxiety or sleep. 60 tablet 0  . atorvastatin (LIPITOR) 10 MG tablet TAKE 1 TABLET (10 MG TOTAL) BY MOUTH DAILY. 90 tablet 2  . cetirizine (ZYRTEC) 10 MG tablet TAKE 1 TABLET (10 MG TOTAL) BY MOUTH DAILY. 90 tablet 0  . cyclobenzaprine (FLEXERIL) 5 MG tablet TAKE 1 TABLET BY MOUTH THREE TIMES A DAY OMS 60 tablet 0  . hydrochlorothiazide (HYDRODIURIL) 25 MG tablet TAKE 1 TABLET (25 MG TOTAL) BY MOUTH DAILY. 90 tablet 2  . lidocaine (LIDODERM) 5 % Place 1 patch onto the skin daily. Remove & Discard patch within 12 hours or as directed by MD 30 patch 0  . NORVASC 5 MG tablet TAKE 1-2 TABLETS BY MOUTH EVERY DAY 180 tablet 1  . potassium chloride SA (KLOR-CON M20) 20 MEQ tablet Take 1 tablet (20 mEq total) by  mouth 2 (two) times daily. 180 tablet 1  . [DISCONTINUED] potassium chloride 20 MEQ TBCR Take 10 mEq by mouth 2 (two) times daily. 30 tablet 0   No current facility-administered medications on file prior to visit.     Allergies  Allergen Reactions  . Gabapentin Rash    Possibly caused rash, was concurrently on valtrex.   . Lisinopril     REACTION: cough  . Valtrex [Valacyclovir Hcl] Other (See Comments)    Possible cause of rash, was concurrently on gabapentin    Past Medical History:  Diagnosis Date  . Allergic rhinitis   . Hyperlipidemia   . Hypertension     Past Surgical History:  Procedure Laterality Date  . gyn surgery  2002   hysterectomy    Family History  Problem Relation Age of Onset  . Heart attack Brother   . Hypertension Brother   . Hypertension Father   . Hypertension Mother   . Hypertension Sister     Social History   Social History  . Marital status: Married    Spouse name: N/A  . Number of children: 1  . Years of education: N/A   Occupational History  . Not on file.   Social History Main Topics  . Smoking status: Never Smoker  . Smokeless tobacco: Never Used  . Alcohol use No  . Drug use:  No  . Sexual activity: Not on file   Other Topics Concern  . Not on file   Social History Narrative  . No narrative on file   The PMH, PSH, Social History, Family History, Medications, and allergies have been reviewed in [facility], and have been updated if relevant.   Review of Systems  Constitutional: Negative.   Respiratory: Negative.   Cardiovascular: Negative.   Skin: Positive for rash.  Neurological:       + zoster pain  Hematological: Negative.   Psychiatric/Behavioral: Negative.   All other systems reviewed and are negative.      Objective:    BP 140/76   Pulse 88   Temp 97.6 F (36.4 C)   Ht 5' (1.524 m)   Wt 126 lb (57.2 kg)   SpO2 96%   BMI 24.61 kg/m    Physical Exam  Constitutional: She is oriented to person, place, and  time. She appears well-developed and well-nourished. No distress.  HENT:  Head: Normocephalic and atraumatic.  Eyes: Conjunctivae are normal.  Cardiovascular: Normal rate.   Pulmonary/Chest: Effort normal.  Musculoskeletal: Normal range of motion.  Neurological: She is alert and oriented to person, place, and time. No cranial nerve deficit.  Skin: She is not diaphoretic.     Psychiatric: She has a normal mood and affect. Her behavior is normal. Judgment and thought content normal.  Nursing note and vitals reviewed.         Assessment & Plan:   Herpes zoster without complication  Thyroid lesion No Follow-up on file.

## 2016-11-19 NOTE — Patient Instructions (Signed)
Please call me if you ever need me.  [phone]

## 2016-11-29 ENCOUNTER — Other Ambulatory Visit: Payer: Self-pay | Admitting: Family Medicine

## 2016-11-29 NOTE — Telephone Encounter (Signed)
Last office visit 11/19/16.  Last refilled 11/01/16 for #30 with no refills.  Ok to refill?

## 2016-12-02 MED ORDER — LIDOCAINE 5 % EX PTCH: 1.0000 | MEDICATED_PATCH | CUTANEOUS | 0 refills | Status: DC

## 2016-12-12 ENCOUNTER — Telehealth: Payer: Self-pay

## 2016-12-12 NOTE — Telephone Encounter (Signed)
Approved to 09-01-17

## 2016-12-12 NOTE — Telephone Encounter (Signed)
Received PA request for Lidocaine patch. Completed on cmm. Waiting on response

## 2017-01-16 ENCOUNTER — Other Ambulatory Visit: Payer: Self-pay | Admitting: Family Medicine

## 2017-01-16 MED ORDER — LIDOCAINE 5 % EX PTCH: 1.0000 | MEDICATED_PATCH | CUTANEOUS | 0 refills | Status: DC

## 2017-01-16 NOTE — Telephone Encounter (Signed)
Lastt refill 12/02/16, last OV 11/19/16

## 2017-01-22 ENCOUNTER — Other Ambulatory Visit: Payer: Self-pay | Admitting: Cardiothoracic Surgery

## 2017-01-22 DIAGNOSIS — R918 Other nonspecific abnormal finding of lung field: Secondary | ICD-10-CM

## 2017-02-05 ENCOUNTER — Encounter: Payer: Self-pay | Admitting: Family Medicine

## 2017-02-05 ENCOUNTER — Ambulatory Visit (INDEPENDENT_AMBULATORY_CARE_PROVIDER_SITE_OTHER): Payer: Medicare Other | Admitting: Family Medicine

## 2017-02-05 VITALS — BP 120/70 | HR 88 | Temp 97.9°F | Wt 129.0 lb

## 2017-02-05 DIAGNOSIS — J069 Acute upper respiratory infection, unspecified: Secondary | ICD-10-CM | POA: Diagnosis not present

## 2017-02-05 MED ORDER — PROMETHAZINE-DM 6.25-15 MG/5ML PO SYRP: 5.0000 mL | ORAL_SOLUTION | Freq: Four times a day (QID) | ORAL | 0 refills | Status: DC | PRN

## 2017-02-05 MED ORDER — AZITHROMYCIN 250 MG PO TABS: ORAL_TABLET | ORAL | 0 refills | Status: DC

## 2017-02-05 NOTE — Progress Notes (Signed)
SUBJECTIVE:  Robin Peck is a 71 y.o. female who complains of coryza, congestion and productive cough for 8 days. She denies a history of anorexia and chest pain and denies a history of asthma. Patient denies smoke cigarettes.   Current Outpatient Prescriptions on File Prior to Visit  Medication Sig Dispense Refill  . ALPRAZolam (XANAX) 0.25 MG tablet Take 1 tablet (0.25 mg total) by mouth 2 (two) times daily as needed for anxiety or sleep. 60 tablet 0  . aspirin EC 81 MG tablet Take 81 mg by mouth daily.    Marland Kitchen atorvastatin (LIPITOR) 10 MG tablet TAKE 1 TABLET (10 MG TOTAL) BY MOUTH DAILY. 90 tablet 2  . cetirizine (ZYRTEC) 10 MG tablet TAKE 1 TABLET (10 MG TOTAL) BY MOUTH DAILY. 90 tablet 0  . cyclobenzaprine (FLEXERIL) 5 MG tablet TAKE 1 TABLET BY MOUTH THREE TIMES A DAY OMS 60 tablet 0  . hydrochlorothiazide (HYDRODIURIL) 25 MG tablet TAKE 1 TABLET (25 MG TOTAL) BY MOUTH DAILY. 90 tablet 2  . HYDROcodone-acetaminophen (NORCO) 10-325 MG tablet Take 1 tablet by mouth every 6 (six) hours as needed. 60 tablet 0  . lidocaine (LIDODERM) 5 % Place 1 patch onto the skin daily. Remove & Discard patch within 12 hours or as directed by MD 30 patch 0  . NORVASC 5 MG tablet TAKE 1-2 TABLETS BY MOUTH EVERY DAY 180 tablet 1  . potassium chloride SA (KLOR-CON M20) 20 MEQ tablet Take 1 tablet (20 mEq total) by mouth 2 (two) times daily. 180 tablet 1  . pregabalin (LYRICA) 75 MG capsule Take 1 capsule (75 mg total) by mouth 2 (two) times daily. 60 capsule 3  . [DISCONTINUED] potassium chloride 20 MEQ TBCR Take 10 mEq by mouth 2 (two) times daily. 30 tablet 0   No current facility-administered medications on file prior to visit.     Allergies  Allergen Reactions  . Gabapentin Rash    Possibly caused rash, was concurrently on valtrex.   . Lisinopril     REACTION: cough  . Valtrex [Valacyclovir Hcl] Other (See Comments)    Possible cause of rash, was concurrently on gabapentin    Past Medical  History:  Diagnosis Date  . Allergic rhinitis   . Hyperlipidemia   . Hypertension     Past Surgical History:  Procedure Laterality Date  . gyn surgery  2002   hysterectomy    Family History  Problem Relation Age of Onset  . Heart attack Brother   . Hypertension Brother   . Hypertension Father   . Hypertension Mother   . Hypertension Sister     Social History   Social History  . Marital status: Married    Spouse name: N/A  . Number of children: 1  . Years of education: N/A   Occupational History  . Not on file.   Social History Main Topics  . Smoking status: Never Smoker  . Smokeless tobacco: Never Used  . Alcohol use No  . Drug use: No  . Sexual activity: Not on file   Other Topics Concern  . Not on file   Social History Narrative  . No narrative on file   The PMH, PSH, Social History, Family History, Medications, and allergies have been reviewed in [facility], and have been updated if relevant.  OBJECTIVE: BP 120/70   Pulse 88   Temp 97.9 F (36.6 C)   Wt 129 lb (58.5 kg)   SpO2 98%   BMI 25.19 kg/m  She appears well, vital signs are as noted. Ears normal.  Throat and pharynx normal.  Neck supple. No adenopathy in the neck. Nose is congested. Sinuses non tender. The chest is clear, without wheezes or rales.  ASSESSMENT:  bronchitis  PLAN: Given duration and progression of symptoms, will treat for bacterial process with zpack, promethazine dm as needed for cough.  Symptomatic therapy suggested: push fluids, rest and return office visit prn if symptoms persist or worsen.Call or return to clinic prn if these symptoms worsen or fail to improve as anticipated.

## 2017-02-05 NOTE — Patient Instructions (Signed)
Great to see you Take zpack and directed.  Promethazine dm as needed for cough (can make you sleepy).

## 2017-02-06 ENCOUNTER — Other Ambulatory Visit: Payer: Self-pay | Admitting: Family Medicine

## 2017-02-17 ENCOUNTER — Other Ambulatory Visit: Payer: Self-pay | Admitting: Family Medicine

## 2017-02-17 MED ORDER — LIDOCAINE 5 % EX PTCH: 1.0000 | MEDICATED_PATCH | CUTANEOUS | 0 refills | Status: DC

## 2017-02-17 MED ORDER — PROMETHAZINE-DM 6.25-15 MG/5ML PO SYRP: 5.0000 mL | ORAL_SOLUTION | Freq: Four times a day (QID) | ORAL | 0 refills | Status: DC | PRN

## 2017-02-17 NOTE — Telephone Encounter (Signed)
Last office visit 02/05/2017.  Last refilled-Cough Syrup 02/05/2017 for 118 ml.  Lidocaine Patch 01/16/2017 for #30 with no refills.  Ok to refill?

## 2017-02-17 NOTE — Telephone Encounter (Signed)
Promethazine DM called into [id number] - [place], [place] - [place]: [phone]

## 2017-02-18 ENCOUNTER — Telehealth: Payer: Self-pay | Admitting: *Deleted

## 2017-02-18 NOTE — Telephone Encounter (Signed)
Received message on triage line from pharmacist Alex calling to clarify directions for Promethazine.  Called pharmacy and spoke to [facility], told him Promethazine verbal order is take 5 mLs by mouth 4 times a day as needed. Alex verbalized understanding.

## 2017-02-25 ENCOUNTER — Other Ambulatory Visit: Payer: Commercial Managed Care - HMO

## 2017-02-26 ENCOUNTER — Ambulatory Visit: Payer: Commercial Managed Care - HMO | Admitting: Cardiothoracic Surgery

## 2017-03-13 ENCOUNTER — Other Ambulatory Visit: Payer: Self-pay | Admitting: Family Medicine

## 2017-03-14 MED ORDER — PREGABALIN 75 MG PO CAPS: 75.0000 mg | ORAL_CAPSULE | Freq: Two times a day (BID) | ORAL | 3 refills | Status: DC

## 2017-03-14 NOTE — Telephone Encounter (Signed)
Last refill 11/19/16 #60 +3  Last OV 02/05/17 Ok to refill?

## 2017-03-14 NOTE — Addendum Note (Signed)
Addended by: Mady Haagensen on: 03/14/2017 12:36 PM   Modules accepted: Orders

## 2017-03-14 NOTE — Telephone Encounter (Signed)
Faxed

## 2017-03-17 ENCOUNTER — Ambulatory Visit: Payer: Self-pay | Admitting: Family Medicine

## 2017-04-01 ENCOUNTER — Other Ambulatory Visit: Payer: Self-pay | Admitting: Family Medicine

## 2017-05-04 ENCOUNTER — Other Ambulatory Visit: Payer: Self-pay | Admitting: Family Medicine

## 2017-05-07 ENCOUNTER — Telehealth: Payer: Self-pay

## 2017-05-07 NOTE — Telephone Encounter (Signed)
Patient is requesting brand name for amlodipine 5 mg. Patient was on generic brand in past and medicare would not pay for name brand. Patient was advise previously with pharmacist about this in the past. Pharmacist will call patient to discuss with patient and will call back to let us know. If patient decides trade name then a PA must be completed.

## 2017-05-09 NOTE — Telephone Encounter (Signed)
Patient is okay with generic brand for Norvasc due to medicare only paying for generic brands. Patient is unsure why all this is happening when she never wanted name brand. Patient stated pharmacy told he she doesn't have any refill but Rx for Amlodipine was sent in 05/06/17 #180 R#1. Patient will contact pharmacy to clarify medication. Patient is sorry for any confusion.

## 2017-05-15 NOTE — Telephone Encounter (Signed)
Christie at [facility] called to see if PA for Norvasc is needed. I read the 05/09/17 entry to [place] and she said she is going to accept that and fill with generic nothing further needed.

## 2017-06-23 ENCOUNTER — Encounter: Payer: Self-pay | Admitting: Family Medicine

## 2017-06-23 ENCOUNTER — Ambulatory Visit (INDEPENDENT_AMBULATORY_CARE_PROVIDER_SITE_OTHER): Payer: Medicare Other | Admitting: Family Medicine

## 2017-06-23 VITALS — BP 136/76 | HR 78 | Temp 97.5°F | Ht 60.0 in | Wt 132.8 lb

## 2017-06-23 DIAGNOSIS — E0789 Other specified disorders of thyroid: Secondary | ICD-10-CM | POA: Diagnosis not present

## 2017-06-23 DIAGNOSIS — B0229 Other postherpetic nervous system involvement: Secondary | ICD-10-CM

## 2017-06-23 DIAGNOSIS — E079 Disorder of thyroid, unspecified: Secondary | ICD-10-CM

## 2017-06-23 MED ORDER — HYDROCODONE-ACETAMINOPHEN 10-325 MG PO TABS: 1.0000 | ORAL_TABLET | Freq: Four times a day (QID) | ORAL | 0 refills | Status: DC | PRN

## 2017-06-23 NOTE — Assessment & Plan Note (Signed)
Persistent but does appear to be slowly improving. Continue lyrica. Hydrocodone rx printed and given to pt for severe pain. The patient indicates understanding of these issues and agrees with the plan.

## 2017-06-23 NOTE — Progress Notes (Signed)
Subjective:   Patient ID: Robin Peck, female    DOB: 1946/08/14, 71 y.o.   MRN: [id number]  Robin Peck is a pleasant 71 y.o. year old female who presents to clinic today with Follow-up (Patient states that she is here today to go ahead with the U/S that was needed for her neck.  States that she put it on hold since she had been Dx with Shingles.  She also feels like she needs to stay on the Lyrica for a bit longer because of the nerve pain.  Also asking for a refill of the Hydrocodone for severe pain.)  on 06/23/2017  HPI:  PHN- Diagnosed with shingles in 11/19/16. Developed persist pain s/p acute shingles rash and was started on Lyrica and Lidoderm patch.  Feels she needs to continue Lyrica.  Still having bouts of neuralgia.  Even needs to take hydrocodone at times for severe shooting pain.  Asking for a refill of this.  In December, incidental thyroid lesion noted on chest CT. Ultrasound was recommended at that time but she wanted to wait until her shingles pain improved.  She is ready to proceed with this now.   Current Outpatient Prescriptions on File Prior to Visit  Medication Sig Dispense Refill  . ALPRAZolam (XANAX) 0.25 MG tablet Take 1 tablet (0.25 mg total) by mouth 2 (two) times daily as needed for anxiety or sleep. 60 tablet 0  . amLODipine (NORVASC) 5 MG tablet TAKE 1-2 TABLETS BY MOUTH EVERY DAY 180 tablet 1  . aspirin EC 81 MG tablet Take 81 mg by mouth daily.    Marland Kitchen atorvastatin (LIPITOR) 10 MG tablet TAKE 1 TABLET BY MOUTH EVERY DAY 90 tablet 2  . cetirizine (ZYRTEC) 10 MG tablet TAKE 1 TABLET (10 MG TOTAL) BY MOUTH DAILY. 90 tablet 0  . hydrochlorothiazide (HYDRODIURIL) 25 MG tablet TAKE 1 TABLET BY MOUTH EVERY DAY 90 tablet 2  . HYDROcodone-acetaminophen (NORCO) 10-325 MG tablet Take 1 tablet by mouth every 6 (six) hours as needed. 60 tablet 0  . KLOR-CON M20 20 MEQ tablet TAKE 1 TABLET BY MOUTH TWICE A DAY 180 tablet 1  . pregabalin (LYRICA) 75 MG capsule  Take 1 capsule (75 mg total) by mouth 2 (two) times daily. 60 capsule 3  . [DISCONTINUED] potassium chloride 20 MEQ TBCR Take 10 mEq by mouth 2 (two) times daily. 30 tablet 0   No current facility-administered medications on file prior to visit.     Allergies  Allergen Reactions  . Gabapentin Rash    Possibly caused rash, was concurrently on valtrex.   . Lisinopril     REACTION: cough  . Valtrex [Valacyclovir Hcl] Other (See Comments)    Possible cause of rash, was concurrently on gabapentin    Past Medical History:  Diagnosis Date  . Allergic rhinitis   . Hyperlipidemia   . Hypertension     Past Surgical History:  Procedure Laterality Date  . gyn surgery  2002   hysterectomy    Family History  Problem Relation Age of Onset  . Heart attack Brother   . Hypertension Brother   . Hypertension Father   . Hypertension Mother   . Hypertension Sister     Social History   Social History  . Marital status: Married    Spouse name: N/A  . Number of children: 1  . Years of education: N/A   Occupational History  . Not on file.   Social History Main Topics  .  Smoking status: Never Smoker  . Smokeless tobacco: Never Used  . Alcohol use No  . Drug use: No  . Sexual activity: Not on file   Other Topics Concern  . Not on file   Social History Narrative  . No narrative on file   The PMH, PSH, Social History, Family History, Medications, and allergies have been reviewed in [facility], and have been updated if relevant.   Review of Systems  Constitutional: Negative.   HENT: Negative.   Eyes: Negative.   Respiratory: Negative.   Cardiovascular: Negative.   Endocrine: Negative.   Genitourinary: Negative.   Musculoskeletal: Negative.   Neurological: Positive for numbness. Negative for dizziness, facial asymmetry, light-headedness and headaches.  Hematological: Negative.   Psychiatric/Behavioral: Negative.   All other systems reviewed and are negative.      Objective:      BP 136/76 (BP Location: Left Arm, Patient Position: Sitting, Cuff Size: Normal)   Pulse 78   Temp (!) 97.5 F (36.4 C) (Oral)   Ht 5' (1.524 m)   Wt 132 lb 12.8 oz (60.2 kg)   SpO2 98%   BMI 25.94 kg/m    Physical Exam  Constitutional: She is oriented to person, place, and time. She appears well-developed and well-nourished. No distress.  HENT:  Head: Normocephalic and atraumatic.  Eyes: Conjunctivae are normal.  Neck: Normal range of motion.  Cardiovascular: Normal rate.   Pulmonary/Chest: Effort normal.  Musculoskeletal: Normal range of motion. She exhibits no edema.  Neurological: She is alert and oriented to person, place, and time. No cranial nerve deficit.  Skin: Skin is dry. She is not diaphoretic.  Psychiatric: She has a normal mood and affect. Her behavior is normal. Judgment and thought content normal.  Nursing note and vitals reviewed.         Assessment & Plan:   Thyroid lesion - Plan: US THYROID  PHN (postherpetic neuralgia) No Follow-up on file.

## 2017-06-23 NOTE — Patient Instructions (Addendum)
Great to see you. Please stop by to see Marion on your way out.   

## 2017-06-23 NOTE — Assessment & Plan Note (Signed)
U/S order placed

## 2017-06-26 ENCOUNTER — Ambulatory Visit
Admission: RE | Admit: 2017-06-26 | Discharge: 2017-06-26 | Disposition: A | Discharge status: Home / Self Care | Payer: Medicare Other | Source: Ambulatory Visit | Attending: Family Medicine | Admitting: Family Medicine

## 2017-06-26 DIAGNOSIS — E0789 Other specified disorders of thyroid: Secondary | ICD-10-CM | POA: Insufficient documentation

## 2017-06-26 DIAGNOSIS — E079 Disorder of thyroid, unspecified: Secondary | ICD-10-CM

## 2017-06-26 DIAGNOSIS — E042 Nontoxic multinodular goiter: Secondary | ICD-10-CM | POA: Diagnosis not present

## 2017-07-12 ENCOUNTER — Other Ambulatory Visit: Payer: Self-pay | Admitting: Family Medicine

## 2017-07-14 NOTE — Telephone Encounter (Signed)
Requesting: Lyrica Contract: None UDS: None Last OV: 10.22.2018 Next OV: Not scheduled Last Refill: 7.13.2018 #60+3   Please advise

## 2017-07-14 NOTE — Telephone Encounter (Signed)
Faxed to pharm/thx dmf 

## 2017-08-02 ENCOUNTER — Other Ambulatory Visit: Payer: Self-pay | Admitting: Family Medicine

## 2017-09-16 ENCOUNTER — Encounter: Payer: Self-pay | Admitting: Family Medicine

## 2017-09-16 ENCOUNTER — Ambulatory Visit (INDEPENDENT_AMBULATORY_CARE_PROVIDER_SITE_OTHER): Payer: Medicare Other | Admitting: Family Medicine

## 2017-09-16 VITALS — BP 138/74 | HR 76 | Temp 97.7°F | Ht 60.0 in | Wt 129.8 lb

## 2017-09-16 DIAGNOSIS — E7800 Pure hypercholesterolemia, unspecified: Secondary | ICD-10-CM

## 2017-09-16 DIAGNOSIS — B0229 Other postherpetic nervous system involvement: Secondary | ICD-10-CM

## 2017-09-16 DIAGNOSIS — R52 Pain, unspecified: Secondary | ICD-10-CM | POA: Insufficient documentation

## 2017-09-16 LAB — COMPREHENSIVE METABOLIC PANEL
ALT: 13 U/L (ref 0–35)
AST: 15 U/L (ref 0–37)
Albumin: 4.4 g/dL (ref 3.5–5.2)
Alkaline Phosphatase: 83 U/L (ref 39–117)
BUN: 13 mg/dL (ref 6–23)
CO2: 33 mEq/L — ABNORMAL HIGH (ref 19–32)
Calcium: 9.7 mg/dL (ref 8.4–10.5)
Chloride: 102 mEq/L (ref 96–112)
Creatinine, Ser: 0.66 mg/dL (ref 0.40–1.20)
GFR: 93.67 mL/min (ref >60.00)
Glucose, Bld: 106 mg/dL — ABNORMAL HIGH (ref 70–99)
Potassium: 3.9 mEq/L (ref 3.5–5.1)
Sodium: 142 mEq/L (ref 135–145)
Total Bilirubin: 0.6 mg/dL (ref 0.2–1.2)
Total Protein: 7.9 g/dL (ref 6.0–8.3)

## 2017-09-16 LAB — LIPID PANEL
Cholesterol: 178 mg/dL (ref 0–200)
HDL: 45 mg/dL (ref >39.00)
LDL Cholesterol: 96 mg/dL (ref 0–99)
NonHDL: 132.91
Total CHOL/HDL Ratio: 4
Triglycerides: 184 mg/dL — ABNORMAL HIGH (ref 0.0–149.0)
VLDL: 36.8 mg/dL (ref 0.0–40.0)

## 2017-09-16 MED ORDER — ATORVASTATIN CALCIUM 10 MG PO TABS: 10.0000 mg | ORAL_TABLET | Freq: Every day | ORAL | 2 refills | Status: DC

## 2017-09-16 MED ORDER — POTASSIUM CHLORIDE CRYS ER 20 MEQ PO TBCR: 20.0000 meq | EXTENDED_RELEASE_TABLET | Freq: Two times a day (BID) | ORAL | 1 refills | Status: DC

## 2017-09-16 MED ORDER — PREGABALIN 75 MG PO CAPS: 75.0000 mg | ORAL_CAPSULE | Freq: Two times a day (BID) | ORAL | 1 refills | Status: DC

## 2017-09-16 NOTE — Assessment & Plan Note (Signed)
Continue lipitor. Check labs today. 

## 2017-09-16 NOTE — Assessment & Plan Note (Signed)
Improving but persistent. Lyrica is helping- continue this. No longer requiring regular narcotics for this.

## 2017-09-16 NOTE — Patient Instructions (Signed)
Happy New Year!  Great to see you. I will call you with your lab results from today and you can view them online.

## 2017-09-16 NOTE — Progress Notes (Signed)
Subjective:   Patient ID: Robin Peck, female    DOB: 1946/05/14, 72 y.o.   MRN: [id number]  Robin Peck is a pleasant 72 y.o. year old female who presents to clinic today with Medication Refill (Patient is here today as she is needing a refill of Lyrica.  [facility] and claimed that no one refilled it.  Still having the burning sensation on her skin from the post-herpetic neuralgia.  She is unable to wear long-sleves because it hurts to touch her skin.)  on 09/16/2017  HPI:   PHN- Diagnosed with shingles in 11/19/16. Developed persist pain s/p acute shingles rash and was started on Lyrica and Lidoderm patch.  Feels she needs to continue Lyrica.  Still having bouts of neuralgia.    HLD- taking lipitor 10 mg daily. Needs refills of this. Lab Results  Component Value Date   CHOL 163 09/17/2016   HDL 38.70 (L) 09/17/2016   LDLCALC 94 09/17/2016   LDLDIRECT 113.0 01/24/2015   TRIG 150.0 (H) 09/17/2016   CHOLHDL 4 09/17/2016   Due for labs.  Current Outpatient Medications on File Prior to Visit  Medication Sig Dispense Refill  . ALPRAZolam (XANAX) 0.25 MG tablet Take 1 tablet (0.25 mg total) by mouth 2 (two) times daily as needed for anxiety or sleep. 60 tablet 0  . amLODipine (NORVASC) 5 MG tablet TAKE 1-2 TABLETS BY MOUTH EVERY DAY 180 tablet 1  . aspirin EC 81 MG tablet Take 81 mg by mouth daily.    . cetirizine (ZYRTEC) 10 MG tablet TAKE 1 TABLET (10 MG TOTAL) BY MOUTH DAILY. 90 tablet 0  . hydrochlorothiazide (HYDRODIURIL) 25 MG tablet TAKE 1 TABLET BY MOUTH EVERY DAY 90 tablet 2  . HYDROcodone-acetaminophen (NORCO) 10-325 MG tablet Take 1 tablet by mouth every 6 (six) hours as needed for severe pain. 30 tablet 0  . [DISCONTINUED] potassium chloride 20 MEQ TBCR Take 10 mEq by mouth 2 (two) times daily. 30 tablet 0   No current facility-administered medications on file prior to visit.     Allergies  Allergen  Reactions  . Gabapentin Rash    Possibly caused rash, was concurrently on valtrex.   . Lisinopril     REACTION: cough  . Valtrex [Valacyclovir Hcl] Other (See Comments)    Possible cause of rash, was concurrently on gabapentin    Past Medical History:  Diagnosis Date  . Allergic rhinitis   . Hyperlipidemia   . Hypertension     Past Surgical History:  Procedure Laterality Date  . gyn surgery  2002   hysterectomy    Family History  Problem Relation Age of Onset  . Heart attack Brother   . Hypertension Brother   . Hypertension Father   . Hypertension Mother   . Hypertension Sister     Social History   Socioeconomic History  . Marital status: Married    Spouse name: Not on file  . Number of children: 1  . Years of education: Not on file  . Highest education level: Not on file  Social Needs  . Financial resource strain: Not on file  . Food insecurity - worry: Not on file  . Food insecurity - inability: Not on file  . Transportation needs - medical: Not on file  . Transportation needs - non-medical: Not on file  Occupational History  . Not on file  Tobacco Use  . Smoking status: Never Smoker  .  Smokeless tobacco: Never Used  Substance and Sexual Activity  . Alcohol use: No  . Drug use: No  . Sexual activity: Not on file  Other Topics Concern  . Not on file  Social History Narrative  . Not on file   The PMH, PSH, Social History, Family History, Medications, and allergies have been reviewed in [facility], and have been updated if relevant.   Review of Systems  Constitutional: Negative.   Respiratory: Negative.   Cardiovascular: Negative.   Musculoskeletal: Negative.   Allergic/Immunologic: Negative.   Neurological: Positive for numbness.  Hematological: Negative.   Psychiatric/Behavioral: Negative.   All other systems reviewed and are negative.      Objective:    BP 138/74 (BP Location: Right Arm, Patient Position: Sitting, Cuff Size: Normal)   Pulse 76    Temp 97.7 F (36.5 C) (Oral)   Ht 5' (1.524 m)   Wt 129 lb 12.8 oz (58.9 kg)   SpO2 98%   BMI 25.35 kg/m    Physical Exam    General:  Well-developed,well-nourished,in no acute distress; alert,appropriate and cooperative throughout examination Head:  normocephalic and atraumatic.   Eyes:  vision grossly intact, PERRL Ears:  R ear normal and L ear normal externally, TMs clear bilaterally Nose:  no external deformity.   Mouth:  good dentition.   Neck:  No deformities, masses, or tenderness noted. Lungs:  Normal respiratory effort, chest expands symmetrically. Lungs are clear to auscultation, no crackles or wheezes. Heart:  Normal rate and regular rhythm. S1 and S2 normal without gallop, murmur, click, rub or other extra sounds. Msk:  No deformity or scoliosis noted of thoracic or lumbar spine.   Extremities:  No clubbing, cyanosis, edema, or deformity noted with normal full range of motion of all joints.   Neurologic:  alert & oriented X3 and gait normal.   Skin:  Intact without suspicious lesions or rashes Psych:  Cognition and judgment appear intact. Alert and cooperative with normal attention span and concentration. No apparent delusions, illusions, hallucinations      Assessment & Plan:   PHN (postherpetic neuralgia)  Pain management  Pure hypercholesterolemia - Plan: Lipid panel, Comprehensive metabolic panel No Follow-up on file.

## 2017-11-11 ENCOUNTER — Other Ambulatory Visit: Payer: Self-pay | Admitting: Family Medicine

## 2017-11-23 DIAGNOSIS — N3001 Acute cystitis with hematuria: Secondary | ICD-10-CM | POA: Diagnosis not present

## 2017-12-05 ENCOUNTER — Encounter: Payer: Self-pay | Admitting: Nurse Practitioner

## 2017-12-05 ENCOUNTER — Ambulatory Visit: Payer: Medicare Other | Admitting: Nurse Practitioner

## 2017-12-12 ENCOUNTER — Ambulatory Visit: Payer: Medicare Other | Admitting: Internal Medicine

## 2017-12-25 ENCOUNTER — Other Ambulatory Visit: Payer: Self-pay | Admitting: Family Medicine

## 2018-04-15 ENCOUNTER — Other Ambulatory Visit: Payer: Self-pay | Admitting: Family Medicine

## 2018-07-22 ENCOUNTER — Other Ambulatory Visit: Payer: Self-pay | Admitting: Family Medicine

## 2018-07-24 ENCOUNTER — Encounter: Payer: Self-pay | Admitting: Family Medicine

## 2018-07-24 ENCOUNTER — Ambulatory Visit (INDEPENDENT_AMBULATORY_CARE_PROVIDER_SITE_OTHER): Payer: Medicare Other | Admitting: Family Medicine

## 2018-07-24 VITALS — BP 140/78 | HR 86 | Temp 97.6°F | Ht 60.0 in | Wt 129.6 lb

## 2018-07-24 DIAGNOSIS — R519 Headache, unspecified: Secondary | ICD-10-CM

## 2018-07-24 DIAGNOSIS — B0229 Other postherpetic nervous system involvement: Secondary | ICD-10-CM | POA: Diagnosis not present

## 2018-07-24 DIAGNOSIS — R51 Headache: Secondary | ICD-10-CM

## 2018-07-24 DIAGNOSIS — I10 Essential (primary) hypertension: Secondary | ICD-10-CM

## 2018-07-24 DIAGNOSIS — F419 Anxiety disorder, unspecified: Secondary | ICD-10-CM

## 2018-07-24 MED ORDER — PREGABALIN 75 MG PO CAPS: 75.0000 mg | ORAL_CAPSULE | Freq: Two times a day (BID) | ORAL | 0 refills | Status: DC

## 2018-07-24 MED ORDER — ALPRAZOLAM 0.25 MG PO TABS: 0.2500 mg | ORAL_TABLET | Freq: Two times a day (BID) | ORAL | 0 refills | Status: DC | PRN

## 2018-07-24 MED ORDER — LOSARTAN POTASSIUM 25 MG PO TABS: 25.0000 mg | ORAL_TABLET | Freq: Every day | ORAL | 3 refills | Status: DC

## 2018-07-24 NOTE — Progress Notes (Signed)
Robin Peck is a 72 y.o. female  Chief Complaint  Patient presents with  . Headache    pounding in head and ear ringing. lightheadness    HPI: Robin Peck is a 72 y.o. female here with her husband complains of 1 week h/o intermittent "pounding" and "pressure" more on the left side of her head compared to the right but present on both sides. She is currently asymptomatic. She endorses episodic lightheadedness, no vertigo. No URI symptoms. No ear pain or pressure. No fever, chills. No vision changes. She notes her ears have "felt hot" and that happens when she has elevated BP or is sick.   She has chronic tinnitus in her Rt ear and decreased hearing - this is not new or worse.  Her BP at home has been running high with SBP in 160-170. She also had increased neck pain in the past 1-2 weeks. Pt is taking HCTZ 25mg  daily and norvasc 5mg  tabs 1-2 tabs daily. Pt has been taking 2 tabs (10mg ) daily.   She has chronic neck pain, has done PT. She has seen specialist but was not recommended to have surgery.   She has seasonal allergies and takes zyrtec. She stopped taking this for her 2.5-3 wk trip to [place] that they just returned from 2 days ago.  She also requests refills of her xanax 0.25mg  PRN (last filled in 2018) and lyrica which she takes BID for post-herpetic neuralgia.   Past Medical History:  Diagnosis Date  . Allergic rhinitis   . Hyperlipidemia   . Hypertension     Past Surgical History:  Procedure Laterality Date  . gyn surgery  2002   hysterectomy    Social History   Socioeconomic History  . Marital status: Married    Spouse name: Not on file  . Number of children: 1  . Years of education: Not on file  . Highest education level: Not on file  Occupational History  . Not on file  Social Needs  . Financial resource strain: Not on file  . Food insecurity:    Worry: Not on file    Inability: Not on file  . Transportation needs:    Medical: Not on file      Non-medical: Not on file  Tobacco Use  . Smoking status: Never Smoker  . Smokeless tobacco: Never Used  Substance and Sexual Activity  . Alcohol use: No  . Drug use: No  . Sexual activity: Not on file  Lifestyle  . Physical activity:    Days per week: Not on file    Minutes per session: Not on file  . Stress: Not on file  Relationships  . Social connections:    Talks on phone: Not on file    Gets together: Not on file    Attends religious service: Not on file    Active member of club or organization: Not on file    Attends meetings of clubs or organizations: Not on file    Relationship status: Not on file  . Intimate partner violence:    Fear of current or ex partner: Not on file    Emotionally abused: Not on file    Physically abused: Not on file    Forced sexual activity: Not on file  Other Topics Concern  . Not on file  Social History Narrative  . Not on file    Family History  Problem Relation Age of Onset  . Heart attack Brother   .  Hypertension Brother   . Hypertension Father   . Hypertension Mother   . Hypertension Sister      Immunization History  Administered Date(s) Administered  . Pneumococcal Conjugate-13 02/14/2016  . Td 01/23/2016    Outpatient Encounter Medications as of 07/24/2018  Medication Sig  . ALPRAZolam (XANAX) 0.25 MG tablet Take 1 tablet (0.25 mg total) by mouth 2 (two) times daily as needed for anxiety or sleep.  Marland Kitchen amLODipine (NORVASC) 5 MG tablet TAKE 1-2 TABLETS BY MOUTH EVERY DAY  . aspirin EC 81 MG tablet Take 81 mg by mouth daily.  Marland Kitchen atorvastatin (LIPITOR) 10 MG tablet Take 1 tablet (10 mg total) by mouth daily.  . cetirizine (ZYRTEC) 10 MG tablet TAKE 1 TABLET (10 MG TOTAL) BY MOUTH DAILY.  . cyclobenzaprine (FLEXERIL) 5 MG tablet Take by mouth.  . hydrochlorothiazide (HYDRODIURIL) 25 MG tablet TAKE 1 TABLET BY MOUTH EVERY DAY  . KLOR-CON M20 20 MEQ tablet TAKE 1 TABLET BY MOUTH TWICE A DAY  . LYRICA 75 MG capsule TAKE 1  CAPSULE BY MOUTH TWICE A DAY  . HYDROcodone-acetaminophen (NORCO) 10-325 MG tablet Take 1 tablet by mouth every 6 (six) hours as needed for severe pain. (Patient not taking: Reported on 07/24/2018)  . [DISCONTINUED] potassium chloride 20 MEQ TBCR Take 10 mEq by mouth 2 (two) times daily.   No facility-administered encounter medications on file as of 07/24/2018.      ROS: Gen: no fever, chills  Skin: no rash, itching ENT: no ear pain, ear drainage, nasal congestion, rhinorrhea, sinus pressure, sore throat Eyes: no blurry vision, double vision Resp: no cough, wheeze,SOB CV: no CP, palpitations, LE edema,  Neuro: no dizziness; + lightheadedness (as per HPI), + headache (as per HPI) Psych: no depression; + anxiety   Allergies  Allergen Reactions  . Gabapentin Rash    Possibly caused rash, was concurrently on valtrex.   . Lisinopril     REACTION: cough  . Valtrex [Valacyclovir Hcl] Other (See Comments)    Possible cause of rash, was concurrently on gabapentin    BP 140/78 (BP Location: Left Arm, Patient Position: Sitting, Cuff Size: Normal)   Pulse 86   Temp 97.6 F (36.4 C) (Oral)   Ht 5' (1.524 m)   Wt 129 lb 9.6 oz (58.8 kg)   SpO2 98%   BMI 25.31 kg/m   BP Readings from Last 3 Encounters:  07/24/18 140/78  09/16/17 138/74  06/23/17 136/76     Physical Exam  Constitutional: She is oriented to person, place, and time. She appears well-developed and well-nourished. No distress.  HENT:  Head: Normocephalic and atraumatic.  Right Ear: Tympanic membrane, external ear and ear canal normal.  Left Ear: Tympanic membrane, external ear and ear canal normal.  Nose: Nose normal. Right sinus exhibits no maxillary sinus tenderness and no frontal sinus tenderness. Left sinus exhibits no maxillary sinus tenderness and no frontal sinus tenderness.  Mouth/Throat: Uvula is midline, oropharynx is clear and moist and mucous membranes are normal.  No TTP over temporal artery Normal  scalp  Eyes: Pupils are equal, round, and reactive to light. Conjunctivae and EOM are normal.  Cardiovascular: Normal rate, regular rhythm and normal heart sounds.  Neurological: She is alert and oriented to person, place, and time. She has normal reflexes. No cranial nerve deficit. Coordination normal.  Skin: Skin is warm and dry.  Psychiatric: She has a normal mood and affect. Her behavior is normal.     A/P:  1.  Essential hypertension 2. Nonintractable episodic headache, unspecified headache type - normal exam - cont HCTZ 25mg  daily and amlodipine 10mg  daily Rx: - losartan (COZAAR) 25 MG tablet; Take 1 tablet (25 mg total) by mouth daily.  Dispense: 90 tablet; Refill: 3 - check BP at home daily and keep log to bring to f/u appt - f/u appt with pcp in about 1.5 wks, sooner if symptoms worsen Discussed plan and reviewed medications with patient, including risks, benefits, and potential side effects. Pt expressed understand. All questions answered.  3. PHN (postherpetic neuralgia) - refilled today, advised f/u with PCP as pt appears to be due for this Refill: - pregabalin (LYRICA) 75 MG capsule; Take 1 capsule (75 mg total) by mouth 2 (two) times daily.  Dispense: 180 capsule; Refill: 0  4. Anxiety - agreed to refill with quant #45 as it was last filled > 1 year ago and Rx has expired Refill: - ALPRAZolam (XANAX) 0.25 MG tablet; Take 1 tablet (0.25 mg total) by mouth 2 (two) times daily as needed for anxiety or sleep.  Dispense: 45 tablet; Refill: 0

## 2018-07-24 NOTE — Patient Instructions (Signed)
Continue HCTZ 25mg  daily Continue with amlodipine (norvasc) 5mg  tabs 2 tabs (10mg ) daily Add losartan 25mg  1 tab daily Check BP at home daily and keep record/log Follow-up with Dr. Deborra Medina (or Dr. Loletha Grayer) in 1-2 wks

## 2018-07-28 NOTE — Progress Notes (Signed)
Subjective:   Robin Peck is a 72 y.o. female who presents for an Initial Medicare Annual Wellness Visit.  The Patient was informed that the wellness visit is to identify future health risk and educate and initiate measures that can reduce risk for increased disease through the lifespan.   Review of Systems   No ROS.  Medicare Wellness Visit. Additional risk factors are reflected in the social history. Cardiac Risk Factors include: advanced age (>12men, >49 women);dyslipidemia;hypertension Home Safety/Smoke Alarms: Feels safe in home. Smoke alarms in place. Lives with husband in 2 story home. Master on 1st.   Eye- pt states she goes for exam every 2 yrs.  Female:      Mammo- declines       Dexa scan- declines       CCS- declines     Objective:    Today's Vitals   07/29/18 0935  BP: 138/70  Pulse: 93  SpO2: 98%  Weight: 129 lb 6.4 oz (58.7 kg)  Height: 5' (1.524 m)   Body mass index is 25.27 kg/m.  Advanced Directives 07/29/2018 05/01/2016  Does Patient Have a Medical Advance Directive? Yes Yes  Type of Paramedic of [place];Living will Living will;Healthcare Power of Attorney  Does patient want to make changes to medical advance directive? - No - Patient declined  Copy of [place] in Chart? No - copy requested -    Current Medications (verified) Outpatient Encounter Medications as of 07/29/2018  Medication Sig  . ALPRAZolam (XANAX) 0.25 MG tablet Take 1 tablet (0.25 mg total) by mouth 2 (two) times daily as needed for anxiety or sleep.  Marland Kitchen amLODipine (NORVASC) 5 MG tablet TAKE 1-2 TABLETS BY MOUTH EVERY DAY  . aspirin EC 81 MG tablet Take 81 mg by mouth daily.  Marland Kitchen atorvastatin (LIPITOR) 10 MG tablet Take 1 tablet (10 mg total) by mouth daily.  . cetirizine (ZYRTEC) 10 MG tablet TAKE 1 TABLET (10 MG TOTAL) BY MOUTH DAILY.  . cyclobenzaprine (FLEXERIL) 5 MG tablet Take by mouth.  . hydrochlorothiazide (HYDRODIURIL) 25 MG  tablet TAKE 1 TABLET BY MOUTH EVERY DAY  . HYDROcodone-acetaminophen (NORCO) 10-325 MG tablet Take 1 tablet by mouth every 6 (six) hours as needed for severe pain.  Marland Kitchen KLOR-CON M20 20 MEQ tablet TAKE 1 TABLET BY MOUTH TWICE A DAY  . losartan (COZAAR) 25 MG tablet Take 1 tablet (25 mg total) by mouth daily.  . pregabalin (LYRICA) 75 MG capsule Take 1 capsule (75 mg total) by mouth 2 (two) times daily.  . [DISCONTINUED] potassium chloride 20 MEQ TBCR Take 10 mEq by mouth 2 (two) times daily.   No facility-administered encounter medications on file as of 07/29/2018.     Allergies (verified) Gabapentin; Lisinopril; and Valtrex [valacyclovir hcl]   History: Past Medical History:  Diagnosis Date  . Allergic rhinitis   . Hyperlipidemia   . Hypertension    Past Surgical History:  Procedure Laterality Date  . gyn surgery  2002   hysterectomy   Family History  Problem Relation Age of Onset  . Heart attack Brother   . Hypertension Brother   . Hypertension Father   . Hypertension Mother   . Stroke Mother   . Hypertension Sister   . Heart attack Maternal Grandfather    Social History   Socioeconomic History  . Marital status: Married    Spouse name: Not on file  . Number of children: 1  . Years of education:  Not on file  . Highest education level: Not on file  Occupational History  . Not on file  Social Needs  . Financial resource strain: Not on file  . Food insecurity:    Worry: Not on file    Inability: Not on file  . Transportation needs:    Medical: Not on file    Non-medical: Not on file  Tobacco Use  . Smoking status: Never Smoker  . Smokeless tobacco: Never Used  Substance and Sexual Activity  . Alcohol use: No  . Drug use: No  . Sexual activity: Not on file  Lifestyle  . Physical activity:    Days per week: Not on file    Minutes per session: Not on file  . Stress: Not on file  Relationships  . Social connections:    Talks on phone: Not on file    Gets  together: Not on file    Attends religious service: Not on file    Active member of club or organization: Not on file    Attends meetings of clubs or organizations: Not on file    Relationship status: Not on file  Other Topics Concern  . Not on file  Social History Narrative  . Not on file    Tobacco Counseling Counseling given: Not Answered   Clinical Intake: Pain : No/denies pain   Activities of Daily Living In your present state of health, do you have any difficulty performing the following activities: 07/29/2018  Hearing? Y  Comment ringing and difficulty hearing in Right ear. Pt states not new or worse.  Vision? N  Difficulty concentrating or making decisions? N  Walking or climbing stairs? N  Dressing or bathing? N  Doing errands, shopping? N  Preparing Food and eating ? N  Using the Toilet? N  In the past six months, have you accidently leaked urine? N  Do you have problems with loss of bowel control? N  Managing your Medications? N  Managing your Finances? Y  Housekeeping or managing your Housekeeping? N  Some recent data might be hidden     Immunizations and Health Maintenance Immunization History  Administered Date(s) Administered  . Pneumococcal Conjugate-13 02/14/2016  . Td 01/23/2016   There are no preventive care reminders to display for this patient.  Patient Care Team: Lucille Passy, MD as PCP - General (Family Medicine)  Indicate any recent Medical Services you may have received from other than [facility] providers in the past year (date may be approximate).     Assessment:   This is a routine wellness examination for Robin Peck. Physical assessment deferred to PCP.  Hearing/Vision screen  Visual Acuity Screening   Right eye Left eye Both eyes  Without correction:     With correction: 20/20 20/20 20/20   Hearing Screening Comments: Pt failed whisper test. Will discuss with PCP at appt next week. Pt states she has hearing aid, but not wearing  it.   Dietary issues and exercise activities discussed: Current Exercise Habits: Home exercise routine, Type of exercise: treadmill, Time (Minutes): 30, Intensity: Mild, Exercise limited by: None identified Diet (meal preparation, eat out, water intake, caffeinated beverages, dairy products, fruits and vegetables): in general, a "healthy" diet  , well balanced Breakfast:oatmeal or eggs. Fruit. Decaf coffee or hot tea Lunch: fruit Dinner:  Fish, potato, steam vegetable. Pt reports she drinks plenty of water.    Goals    . Increase physical activity     Walk on treadmill 30 minutes/  3x per week.      Depression Screen PHQ 2/9 Scores 07/29/2018 06/23/2017 02/05/2017  PHQ - 2 Score 0 0 0    Fall Risk Fall Risk  07/29/2018 06/23/2017  Falls in the past year? 0 No    Cognitive Function: Ad8 score reviewed for issues:  Issues making decisions:no  Less interest in hobbies / activities:no  Repeats questions, stories (family complaining):no  Trouble using ordinary gadgets (microwave, computer, phone):no  Forgets the month or year: no  Mismanaging finances: no  Remembering appts:no  Daily problems with thinking and/or memory:no Ad8 score is=0         Screening Tests Health Maintenance  Topic Date Due  . MAMMOGRAM  09/16/2018 (Originally 05/17/2010)  . PNA vac Low Risk Adult (2 of 2 - PPSV23) 09/16/2018 (Originally 02/13/2017)  . INFLUENZA VACCINE  07/30/2019 (Originally 04/02/2018)  . COLONOSCOPY  10/10/2020  . TETANUS/TDAP  01/22/2026  . DEXA SCAN  Completed  . Hepatitis C Screening  Completed      Plan:    Please schedule your next medicare wellness visit with me in 1 yr.  Continue to eat heart healthy diet (full of fruits, vegetables, whole grains, lean protein, water--limit salt, fat, and sugar intake) and increase physical activity as tolerated.  Continue doing brain stimulating activities (puzzles, reading, adult coloring books, staying active) to keep memory  sharp.    I have personally reviewed and noted the following in the patient's chart:   . Medical and social history . Use of alcohol, tobacco or illicit drugs  . Current medications and supplements . Functional ability and status . Nutritional status . Physical activity . Advanced directives . List of other physicians . Hospitalizations, surgeries, and ER visits in previous 12 months . Vitals . Screenings to include cognitive, depression, and falls . Referrals and appointments  In addition, I have reviewed and discussed with patient certain preventive protocols, quality metrics, and best practice recommendations. A written personalized care plan for preventive services as well as general preventive health recommendations were provided to patient.     Shela Nevin, [place]   07/29/2018

## 2018-07-29 ENCOUNTER — Encounter: Payer: Self-pay | Admitting: *Deleted

## 2018-07-29 ENCOUNTER — Other Ambulatory Visit: Payer: Self-pay | Admitting: Family Medicine

## 2018-07-29 ENCOUNTER — Ambulatory Visit (INDEPENDENT_AMBULATORY_CARE_PROVIDER_SITE_OTHER): Payer: Medicare Other | Admitting: *Deleted

## 2018-07-29 VITALS — BP 138/70 | HR 93 | Ht 60.0 in | Wt 129.4 lb

## 2018-07-29 DIAGNOSIS — E0789 Other specified disorders of thyroid: Secondary | ICD-10-CM

## 2018-07-29 DIAGNOSIS — I10 Essential (primary) hypertension: Secondary | ICD-10-CM | POA: Diagnosis not present

## 2018-07-29 DIAGNOSIS — E7800 Pure hypercholesterolemia, unspecified: Secondary | ICD-10-CM

## 2018-07-29 DIAGNOSIS — Z0000 Encounter for general adult medical examination without abnormal findings: Secondary | ICD-10-CM | POA: Diagnosis not present

## 2018-07-29 DIAGNOSIS — E079 Disorder of thyroid, unspecified: Secondary | ICD-10-CM

## 2018-07-29 LAB — COMPREHENSIVE METABOLIC PANEL
ALT: 12 U/L (ref 0–35)
AST: 17 U/L (ref 0–37)
Albumin: 4.5 g/dL (ref 3.5–5.2)
Alkaline Phosphatase: 87 U/L (ref 39–117)
BUN: 10 mg/dL (ref 6–23)
CO2: 31 mEq/L (ref 19–32)
Calcium: 10.1 mg/dL (ref 8.4–10.5)
Chloride: 100 mEq/L (ref 96–112)
Creatinine, Ser: 0.75 mg/dL (ref 0.40–1.20)
GFR: 80.62 mL/min (ref >60.00)
Glucose, Bld: 113 mg/dL — ABNORMAL HIGH (ref 70–99)
Potassium: 3.5 mEq/L (ref 3.5–5.1)
Sodium: 139 mEq/L (ref 135–145)
Total Bilirubin: 0.6 mg/dL (ref 0.2–1.2)
Total Protein: 8.5 g/dL — ABNORMAL HIGH (ref 6.0–8.3)

## 2018-07-29 LAB — CBC WITH DIFFERENTIAL/PLATELET
Basophils Absolute: 0 10*3/uL (ref 0.0–0.1)
Basophils Relative: 0.3 % (ref 0.0–3.0)
Eosinophils Absolute: 0.4 10*3/uL (ref 0.0–0.7)
Eosinophils Relative: 4.4 % (ref 0.0–5.0)
HCT: 42.5 % (ref 36.0–46.0)
Hemoglobin: 14.3 g/dL (ref 12.0–15.0)
Lymphocytes Relative: 39.1 % (ref 12.0–46.0)
Lymphs Abs: 3.4 10*3/uL (ref 0.7–4.0)
MCHC: 33.7 g/dL (ref 30.0–36.0)
MCV: 85 fl (ref 78.0–100.0)
Monocytes Absolute: 0.6 10*3/uL (ref 0.1–1.0)
Monocytes Relative: 7.5 % (ref 3.0–12.0)
Neutro Abs: 4.2 10*3/uL (ref 1.4–7.7)
Neutrophils Relative %: 48.7 % (ref 43.0–77.0)
Platelets: 390 10*3/uL (ref 150.0–400.0)
RBC: 5.01 Mil/uL (ref 3.87–5.11)
RDW: 13.3 % (ref 11.5–15.5)
WBC: 8.6 10*3/uL (ref 4.0–10.5)

## 2018-07-29 LAB — LIPID PANEL
Cholesterol: 168 mg/dL (ref 0–200)
HDL: 44.2 mg/dL (ref >39.00)
LDL Cholesterol: 85 mg/dL (ref 0–99)
NonHDL: 123.41
Total CHOL/HDL Ratio: 4
Triglycerides: 191 mg/dL — ABNORMAL HIGH (ref 0.0–149.0)
VLDL: 38.2 mg/dL (ref 0.0–40.0)

## 2018-07-29 LAB — TSH: TSH: 2.11 u[IU]/mL (ref 0.35–4.50)

## 2018-07-29 NOTE — Patient Instructions (Signed)
Please schedule your next medicare wellness visit with me in 1 yr.  Continue to eat heart healthy diet (full of fruits, vegetables, whole grains, lean protein, water--limit salt, fat, and sugar intake) and increase physical activity as tolerated.  Continue doing brain stimulating activities (puzzles, reading, adult coloring books, staying active) to keep memory sharp.    Robin Peck , Thank you for taking time to come for your Medicare Wellness Visit. I appreciate your ongoing commitment to your health goals. Please review the following plan we discussed and let me know if I can assist you in the future.   These are the goals we discussed: Goals    . Increase physical activity     Walk on treadmill 30 minutes/ 3x per week.       This is a list of the screening recommended for you and due dates:  Health Maintenance  Topic Date Due  . Mammogram  09/16/2018*  . Pneumonia vaccines (2 of 2 - PPSV23) 09/16/2018*  . Flu Shot  07/30/2019*  . Colon Cancer Screening  10/10/2020  . Tetanus Vaccine  01/22/2026  . DEXA scan (bone density measurement)  Completed  .  Hepatitis C: One time screening is recommended by Center for Disease Control  (CDC) for  adults born from [birth year] through 1965.   Completed  *Topic was postponed. The date shown is not the original due date.    Health Maintenance for Postmenopausal Women Menopause is a normal process in which your reproductive ability comes to an end. This process happens gradually over a span of months to years, usually between the ages of 32 and 36. Menopause is complete when you have missed 12 consecutive menstrual periods. It is important to talk with your health care provider about some of the most common conditions that affect postmenopausal women, such as heart disease, cancer, and bone loss (osteoporosis). Adopting a healthy lifestyle and getting preventive care can help to promote your health and wellness. Those actions can also lower your chances  of developing some of these common conditions. What should I know about menopause? During menopause, you may experience a number of symptoms, such as:  Moderate-to-severe hot flashes.  Night sweats.  Decrease in sex drive.  Mood swings.  Headaches.  Tiredness.  Irritability.  Memory problems.  Insomnia.  Choosing to treat or not to treat menopausal changes is an individual decision that you make with your health care provider. What should I know about hormone replacement therapy and supplements? Hormone therapy products are effective for treating symptoms that are associated with menopause, such as hot flashes and night sweats. Hormone replacement carries certain risks, especially as you become older. If you are thinking about using estrogen or estrogen with progestin treatments, discuss the benefits and risks with your health care provider. What should I know about heart disease and stroke? Heart disease, heart attack, and stroke become more likely as you age. This may be due, in part, to the hormonal changes that your body experiences during menopause. These can affect how your body processes dietary fats, triglycerides, and cholesterol. Heart attack and stroke are both medical emergencies. There are many things that you can do to help prevent heart disease and stroke:  Have your blood pressure checked at least every 1-2 years. High blood pressure causes heart disease and increases the risk of stroke.  If you are 36-40 years old, ask your health care provider if you should take aspirin to prevent a heart attack or a stroke.  Do not use any tobacco products, including cigarettes, chewing tobacco, or electronic cigarettes. If you need help quitting, ask your health care provider.  It is important to eat a healthy diet and maintain a healthy weight. ? Be sure to include plenty of vegetables, fruits, low-fat dairy products, and lean protein. ? Avoid eating foods that are high in  solid fats, added sugars, or salt (sodium).  Get regular exercise. This is one of the most important things that you can do for your health. ? Try to exercise for at least 150 minutes each week. The type of exercise that you do should increase your heart rate and make you sweat. This is known as moderate-intensity exercise. ? Try to do strengthening exercises at least twice each week. Do these in addition to the moderate-intensity exercise.  Know your numbers.Ask your health care provider to check your cholesterol and your blood glucose. Continue to have your blood tested as directed by your health care provider.  What should I know about cancer screening? There are several types of cancer. Take the following steps to reduce your risk and to catch any cancer development as early as possible. Breast Cancer  Practice breast self-awareness. ? This means understanding how your breasts normally appear and feel. ? It also means doing regular breast self-exams. Let your health care provider know about any changes, no matter how small.  If you are 26 or older, have a clinician do a breast exam (clinical breast exam or CBE) every year. Depending on your age, family history, and medical history, it may be recommended that you also have a yearly breast X-ray (mammogram).  If you have a family history of breast cancer, talk with your health care provider about genetic screening.  If you are at high risk for breast cancer, talk with your health care provider about having an MRI and a mammogram every year.  Breast cancer (BRCA) gene test is recommended for women who have family members with BRCA-related cancers. Results of the assessment will determine the need for genetic counseling and BRCA1 and for BRCA2 testing. BRCA-related cancers include these types: ? Breast. This occurs in males or females. ? Ovarian. ? Tubal. This may also be called fallopian tube cancer. ? Cancer of the abdominal or pelvic  lining (peritoneal cancer). ? Prostate. ? Pancreatic.  Cervical, Uterine, and Ovarian Cancer Your health care provider may recommend that you be screened regularly for cancer of the pelvic organs. These include your ovaries, uterus, and vagina. This screening involves a pelvic exam, which includes checking for microscopic changes to the surface of your cervix (Pap test).  For women ages 21-65, health care providers may recommend a pelvic exam and a Pap test every three years. For women ages 2-65, they may recommend the Pap test and pelvic exam, combined with testing for human papilloma virus (HPV), every five years. Some types of HPV increase your risk of cervical cancer. Testing for HPV may also be done on women of any age who have unclear Pap test results.  Other health care providers may not recommend any screening for nonpregnant women who are considered low risk for pelvic cancer and have no symptoms. Ask your health care provider if a screening pelvic exam is right for you.  If you have had past treatment for cervical cancer or a condition that could lead to cancer, you need Pap tests and screening for cancer for at least 20 years after your treatment. If Pap tests have been discontinued  for you, your risk factors (such as having a new sexual partner) need to be reassessed to determine if you should start having screenings again. Some women have medical problems that increase the chance of getting cervical cancer. In these cases, your health care provider may recommend that you have screening and Pap tests more often.  If you have a family history of uterine cancer or ovarian cancer, talk with your health care provider about genetic screening.  If you have vaginal bleeding after reaching menopause, tell your health care provider.  There are currently no reliable tests available to screen for ovarian cancer.  Lung Cancer Lung cancer screening is recommended for adults 73-17 years old who  are at high risk for lung cancer because of a history of smoking. A yearly low-dose CT scan of the lungs is recommended if you:  Currently smoke.  Have a history of at least 30 pack-years of smoking and you currently smoke or have quit within the past 15 years. A pack-year is smoking an average of one pack of cigarettes per day for one year.  Yearly screening should:  Continue until it has been 15 years since you quit.  Stop if you develop a health problem that would prevent you from having lung cancer treatment.  Colorectal Cancer  This type of cancer can be detected and can often be prevented.  Routine colorectal cancer screening usually begins at age 11 and continues through age 48.  If you have risk factors for colon cancer, your health care provider may recommend that you be screened at an earlier age.  If you have a family history of colorectal cancer, talk with your health care provider about genetic screening.  Your health care provider may also recommend using home test kits to check for hidden blood in your stool.  A small camera at the end of a tube can be used to examine your colon directly (sigmoidoscopy or colonoscopy). This is done to check for the earliest forms of colorectal cancer.  Direct examination of the colon should be repeated every 5-10 years until age 4. However, if early forms of precancerous polyps or small growths are found or if you have a family history or genetic risk for colorectal cancer, you may need to be screened more often.  Skin Cancer  Check your skin from head to toe regularly.  Monitor any moles. Be sure to tell your health care provider: ? About any new moles or changes in moles, especially if there is a change in a mole's shape or color. ? If you have a mole that is larger than the size of a pencil eraser.  If any of your family members has a history of skin cancer, especially at a young age, talk with your health care provider about  genetic screening.  Always use sunscreen. Apply sunscreen liberally and repeatedly throughout the day.  Whenever you are outside, protect yourself by wearing long sleeves, pants, a wide-brimmed hat, and sunglasses.  What should I know about osteoporosis? Osteoporosis is a condition in which bone destruction happens more quickly than new bone creation. After menopause, you may be at an increased risk for osteoporosis. To help prevent osteoporosis or the bone fractures that can happen because of osteoporosis, the following is recommended:  If you are 58-65 years old, get at least 1,000 mg of calcium and at least 600 mg of vitamin D per day.  If you are older than age 79 but younger than age 95, get  at least 1,200 mg of calcium and at least 600 mg of vitamin D per day.  If you are older than age 70, get at least 1,200 mg of calcium and at least 800 mg of vitamin D per day.  Smoking and excessive alcohol intake increase the risk of osteoporosis. Eat foods that are rich in calcium and vitamin D, and do weight-bearing exercises several times each week as directed by your health care provider. What should I know about how menopause affects my mental health? Depression may occur at any age, but it is more common as you become older. Common symptoms of depression include:  Low or sad mood.  Changes in sleep patterns.  Changes in appetite or eating patterns.  Feeling an overall lack of motivation or enjoyment of activities that you previously enjoyed.  Frequent crying spells.  Talk with your health care provider if you think that you are experiencing depression. What should I know about immunizations? It is important that you get and maintain your immunizations. These include:  Tetanus, diphtheria, and pertussis (Tdap) booster vaccine.  Influenza every year before the flu season begins.  Pneumonia vaccine.  Shingles vaccine.  Your health care provider may also recommend other  immunizations. This information is not intended to replace advice given to you by your health care provider. Make sure you discuss any questions you have with your health care provider. Document Released: 10/11/2005 Document Revised: 03/08/2016 Document Reviewed: 05/23/2015 Elsevier Interactive Patient Education  2018 [employer]  

## 2018-07-29 NOTE — Progress Notes (Signed)
I reviewed health advisor's note, was available for consultation, and agree with documentation and plan.  

## 2018-08-03 ENCOUNTER — Ambulatory Visit: Payer: Medicare Other | Admitting: Family Medicine

## 2018-08-04 ENCOUNTER — Ambulatory Visit (INDEPENDENT_AMBULATORY_CARE_PROVIDER_SITE_OTHER): Payer: Medicare Other | Admitting: Family Medicine

## 2018-08-04 ENCOUNTER — Encounter: Payer: Self-pay | Admitting: Family Medicine

## 2018-08-04 VITALS — BP 132/76 | HR 95 | Temp 98.7°F | Ht 60.0 in | Wt 131.0 lb

## 2018-08-04 DIAGNOSIS — H9311 Tinnitus, right ear: Secondary | ICD-10-CM | POA: Insufficient documentation

## 2018-08-04 DIAGNOSIS — B0229 Other postherpetic nervous system involvement: Secondary | ICD-10-CM | POA: Diagnosis not present

## 2018-08-04 DIAGNOSIS — E7800 Pure hypercholesterolemia, unspecified: Secondary | ICD-10-CM

## 2018-08-04 DIAGNOSIS — H9313 Tinnitus, bilateral: Secondary | ICD-10-CM | POA: Insufficient documentation

## 2018-08-04 DIAGNOSIS — I10 Essential (primary) hypertension: Secondary | ICD-10-CM | POA: Diagnosis not present

## 2018-08-04 NOTE — Assessment & Plan Note (Signed)
She is very pleased with her BP since losartan was added.  I have asked her to look for signs of dry cough as she is unsure if she is having this side effect.  She will also continue to check her BP at home for me and update me with those readings as well.

## 2018-08-04 NOTE — Assessment & Plan Note (Signed)
Well controlled on current dose of lipitor. No changes made today.

## 2018-08-04 NOTE — Assessment & Plan Note (Signed)
Still has breakthrough symptoms, continue current dose of Lyrica. The patient indicates understanding of these issues and agrees with the plan.

## 2018-08-04 NOTE — Patient Instructions (Signed)
Great to see you. Happy Holidays!  Please continue checking your blood pressures and keep me updated.

## 2018-08-04 NOTE — Assessment & Plan Note (Signed)
Ongoing for years and it is likely that there will not be much improvement but I did suggest ENT referral for evaluation and possible tx options.  She will consider this options and let me know if she decides she does want referral. The patient indicates understanding of these issues and agrees with the plan.

## 2018-08-04 NOTE — Progress Notes (Signed)
Subjective:   Patient ID: Robin Peck, female    DOB: 05/16/1946, 72 y.o.   MRN: [id number]  Robin Peck is a pleasant 73 y.o. year old female who presents to clinic today with Follow-up (C/O dry cough and dry mouth, elevatiion in her blood pressure when she was in [place] this month. Admits to ringing in her right ear.  Denies headaches. Has been tolrating losartan dailt has noticed her BP has improved. t)  on 08/04/2018  HPI:  Here for follow up. Had annual wellness visit with Gary Fleet, RN on 07/29/18.  Note reviewed. She also saw Dr. Bryan Lemma on 07/24/18 for headache, BP elevated- note reviewed.  At that [place], her BP at home had been running 160s- 170s with HCTZ 25 mg daily and Norvac 10 mg daily. Losartan 25 mg daily was added at that OV.  BP have improved dramatically since then.  She is feeling better.  She is allergic to ACEI (cough) so upon questioning, she thinks maybe she has had a slight cough with lostartan.  She also just flew back from [place] visiting her new grandson.   BP Readings from Last 3 Encounters:  08/04/18 132/76  07/29/18 138/70  07/24/18 140/78   Lab Results  Component Value Date   CREATININE 0.75 07/29/2018   She has had right ear tinnitus for years.  Sometimes really bothers her.  Not sure what makes it better or worse.  Hld- well controlled on low dose lipitor, 10 mg daily.  Lab Results  Component Value Date   CHOL 168 07/29/2018   HDL 44.20 07/29/2018   LDLCALC 85 07/29/2018   LDLDIRECT 113.0 01/24/2015   TRIG 191.0 (H) 07/29/2018   CHOLHDL 4 07/29/2018   PHN- she does still having symptoms on left side- especially with touch, sometimes sharp shooting pain.  She is afraid to stop lyrica, feels she needs to continue this.  Current Outpatient Medications on File Prior to Visit  Medication Sig Dispense Refill  . ALPRAZolam (XANAX) 0.25 MG tablet Take 1 tablet (0.25 mg total) by mouth 2 (two) times daily as needed for anxiety or  sleep. 45 tablet 0  . amLODipine (NORVASC) 5 MG tablet TAKE 1-2 TABLETS BY MOUTH EVERY DAY 180 tablet 1  . aspirin EC 81 MG tablet Take 81 mg by mouth daily.    Marland Kitchen atorvastatin (LIPITOR) 10 MG tablet Take 1 tablet (10 mg total) by mouth daily. 90 tablet 2  . cetirizine (ZYRTEC) 10 MG tablet TAKE 1 TABLET (10 MG TOTAL) BY MOUTH DAILY. 90 tablet 0  . cyclobenzaprine (FLEXERIL) 5 MG tablet Take by mouth.    . hydrochlorothiazide (HYDRODIURIL) 25 MG tablet TAKE 1 TABLET BY MOUTH EVERY DAY 90 tablet 2  . KLOR-CON M20 20 MEQ tablet TAKE 1 TABLET BY MOUTH TWICE A DAY 180 tablet 0  . losartan (COZAAR) 25 MG tablet Take 1 tablet (25 mg total) by mouth daily. 90 tablet 3  . pregabalin (LYRICA) 75 MG capsule Take 1 capsule (75 mg total) by mouth 2 (two) times daily. 180 capsule 0  . [DISCONTINUED] potassium chloride 20 MEQ TBCR Take 10 mEq by mouth 2 (two) times daily. 30 tablet 0   No current facility-administered medications on file prior to visit.     Allergies  Allergen Reactions  . Gabapentin Rash    Possibly caused rash, was concurrently on valtrex.   . Lisinopril     REACTION: cough  . Valtrex [Valacyclovir Hcl] Other (See Comments)  Possible cause of rash, was concurrently on gabapentin    Past Medical History:  Diagnosis Date  . Allergic rhinitis   . Hyperlipidemia   . Hypertension     Past Surgical History:  Procedure Laterality Date  . gyn surgery  2002   hysterectomy    Family History  Problem Relation Age of Onset  . Heart attack Brother   . Hypertension Brother   . Hypertension Father   . Hypertension Mother   . Stroke Mother   . Hypertension Sister   . Heart attack Maternal Grandfather     Social History   Socioeconomic History  . Marital status: Married    Spouse name: Not on file  . Number of children: 1  . Years of education: Not on file  . Highest education level: Not on file  Occupational History  . Not on file  Social Needs  . Financial resource  strain: Not on file  . Food insecurity:    Worry: Not on file    Inability: Not on file  . Transportation needs:    Medical: Not on file    Non-medical: Not on file  Tobacco Use  . Smoking status: Never Smoker  . Smokeless tobacco: Never Used  Substance and Sexual Activity  . Alcohol use: No  . Drug use: No  . Sexual activity: Not on file  Lifestyle  . Physical activity:    Days per week: Not on file    Minutes per session: Not on file  . Stress: Not on file  Relationships  . Social connections:    Talks on phone: Not on file    Gets together: Not on file    Attends religious service: Not on file    Active member of club or organization: Not on file    Attends meetings of clubs or organizations: Not on file    Relationship status: Not on file  . Intimate partner violence:    Fear of current or ex partner: Not on file    Emotionally abused: Not on file    Physically abused: Not on file    Forced sexual activity: Not on file  Other Topics Concern  . Not on file  Social History Narrative  . Not on file   The PMH, PSH, Social History, Family History, Medications, and allergies have been reviewed in [facility], and have been updated if relevant.   Review of Systems  Constitutional: Negative.   HENT: Positive for tinnitus. Negative for congestion, dental problem, facial swelling, hearing loss, nosebleeds, rhinorrhea, sinus pressure, sinus pain and sneezing.   Eyes: Negative.   Respiratory: Negative.   Cardiovascular: Negative.   Gastrointestinal: Negative.   Endocrine: Negative.   Musculoskeletal: Negative.   Skin: Negative.   Allergic/Immunologic: Negative.   Neurological: Positive for numbness. Negative for dizziness, tremors, seizures, syncope, facial asymmetry, speech difficulty, weakness, light-headedness and headaches.  Hematological: Negative.   Psychiatric/Behavioral: Negative.   All other systems reviewed and are negative.      Objective:    BP 132/76   Pulse  95   Temp 98.7 F (37.1 C) (Oral)   Ht 5' (1.524 m)   Wt 131 lb (59.4 kg)   SpO2 97%   BMI 25.58 kg/m   Wt Readings from Last 3 Encounters:  08/04/18 131 lb (59.4 kg)  07/29/18 129 lb 6.4 oz (58.7 kg)  07/24/18 129 lb 9.6 oz (58.8 kg)     Physical Exam  Constitutional: She is oriented to person,  place, and time. She appears well-developed and well-nourished. No distress.  HENT:  Head: Normocephalic and atraumatic.  Right Ear: External ear normal.  Left Ear: External ear normal.  Eyes: EOM are normal.  Neck: Normal range of motion.  Cardiovascular: Normal rate and regular rhythm.  Pulmonary/Chest: Effort normal and breath sounds normal.  Musculoskeletal: Normal range of motion. She exhibits no edema.  Neurological: She is alert and oriented to person, place, and time. No cranial nerve deficit.  Skin: Skin is warm and dry. She is not diaphoretic.  Psychiatric: She has a normal mood and affect. Her behavior is normal. Judgment and thought content normal.  Nursing note and vitals reviewed.         Assessment & Plan:   Essential hypertension  Pure hypercholesterolemia  PHN (postherpetic neuralgia)  Tinnitus aurium, right No follow-ups on file.

## 2018-09-03 ENCOUNTER — Encounter: Payer: Self-pay | Admitting: Family Medicine

## 2018-09-03 ENCOUNTER — Ambulatory Visit (INDEPENDENT_AMBULATORY_CARE_PROVIDER_SITE_OTHER): Payer: Medicare Other | Admitting: Family Medicine

## 2018-09-03 VITALS — BP 120/78 | HR 68 | Temp 97.6°F | Ht 60.0 in | Wt 130.6 lb

## 2018-09-03 DIAGNOSIS — R399 Unspecified symptoms and signs involving the genitourinary system: Secondary | ICD-10-CM

## 2018-09-03 DIAGNOSIS — R682 Dry mouth, unspecified: Secondary | ICD-10-CM | POA: Diagnosis not present

## 2018-09-03 DIAGNOSIS — R300 Dysuria: Secondary | ICD-10-CM

## 2018-09-03 DIAGNOSIS — H9311 Tinnitus, right ear: Secondary | ICD-10-CM | POA: Diagnosis not present

## 2018-09-03 LAB — POCT URINALYSIS DIPSTICK
Bilirubin, UA: NEGATIVE
Glucose, UA: NEGATIVE
Ketones, UA: NEGATIVE
Nitrite, UA: NEGATIVE
Protein, UA: NEGATIVE
Spec Grav, UA: 1.01 (ref 1.010–1.025)
Urobilinogen, UA: 0.2 E.U./dL
pH, UA: 7 (ref 5.0–8.0)

## 2018-09-03 MED ORDER — NITROFURANTOIN MONOHYD MACRO 100 MG PO CAPS
100.0000 mg | ORAL_CAPSULE | Freq: Two times a day (BID) | ORAL | 0 refills | Status: AC
Start: 2018-09-03 — End: 2018-09-08

## 2018-09-03 NOTE — Progress Notes (Signed)
Robin Peck is a 72 y.o. female  Chief Complaint  Patient presents with  . Urinary Tract Infection    burning w/urination for 7 days, odorcomes on and off/ dry mouth since 07/2018/ noise going on in head banging since 07/2018    HPI: Robin Peck is a 73 y.o. female here with her husband and complains of dysuria, urgency, frequency x 7 days. No gross hematuria. She notes a stronger odor to her urine this past week. No fever, chills. No back pain. No n/v. She has been drinking cranberry juice BID.   Pt complains of dry mouth worse at night since 07/2018. Pt states she does not snore, does not have nasal congestion, does not sleep with her mouth open. She is drinking 3 bottles of water per night at this point without improvement. She states PCP told her to drink more water but this isn't helping  She also complains of "banging" that she "hears" in her head since 07/2018. She states she hears it. Symptoms last hours to days. She estimates in 1 week she can have 1-7 episodes. Associated lightheadedness at times. Not associated with position. She notes some "blurry vision" when she experiences the "head banging". It is not a pain. Pt is not able to further describe symptoms. Symptoms not related to BP as her BP appears to be at goal now. She has chronic, intermittent neck pain but does not feel symptoms are related to this. She does have tinnitus that has been going on for months (longer than above symptoms). She has not seen ENT. She is very anxious about symptoms and worries "something terrible is wrong".   Past Medical History:  Diagnosis Date  . Allergic rhinitis   . Hyperlipidemia   . Hypertension     Past Surgical History:  Procedure Laterality Date  . gyn surgery  2002   hysterectomy    Social History   Socioeconomic History  . Marital status: Married    Spouse name: Not on file  . Number of children: 1  . Years of education: Not on file  . Highest education level: Not  on file  Occupational History  . Not on file  Social Needs  . Financial resource strain: Not on file  . Food insecurity:    Worry: Not on file    Inability: Not on file  . Transportation needs:    Medical: Not on file    Non-medical: Not on file  Tobacco Use  . Smoking status: Never Smoker  . Smokeless tobacco: Never Used  Substance and Sexual Activity  . Alcohol use: No  . Drug use: No  . Sexual activity: Not on file  Lifestyle  . Physical activity:    Days per week: Not on file    Minutes per session: Not on file  . Stress: Not on file  Relationships  . Social connections:    Talks on phone: Not on file    Gets together: Not on file    Attends religious service: Not on file    Active member of club or organization: Not on file    Attends meetings of clubs or organizations: Not on file    Relationship status: Not on file  . Intimate partner violence:    Fear of current or ex partner: Not on file    Emotionally abused: Not on file    Physically abused: Not on file    Forced sexual activity: Not on file  Other Topics Concern  .  Not on file  Social History Narrative  . Not on file    Family History  Problem Relation Age of Onset  . Heart attack Brother   . Hypertension Brother   . Hypertension Father   . Hypertension Mother   . Stroke Mother   . Hypertension Sister   . Heart attack Maternal Grandfather      Immunization History  Administered Date(s) Administered  . Pneumococcal Conjugate-13 02/14/2016  . Td 01/23/2016    Outpatient Encounter Medications as of 09/03/2018  Medication Sig  . ALPRAZolam (XANAX) 0.25 MG tablet Take 1 tablet (0.25 mg total) by mouth 2 (two) times daily as needed for anxiety or sleep.  Marland Kitchen amLODipine (NORVASC) 5 MG tablet TAKE 1-2 TABLETS BY MOUTH EVERY DAY  . aspirin EC 81 MG tablet Take 81 mg by mouth daily.  Marland Kitchen atorvastatin (LIPITOR) 10 MG tablet Take 1 tablet (10 mg total) by mouth daily.  . cetirizine (ZYRTEC) 10 MG tablet TAKE  1 TABLET (10 MG TOTAL) BY MOUTH DAILY.  . cyclobenzaprine (FLEXERIL) 5 MG tablet Take by mouth.  . hydrochlorothiazide (HYDRODIURIL) 25 MG tablet TAKE 1 TABLET BY MOUTH EVERY DAY  . KLOR-CON M20 20 MEQ tablet TAKE 1 TABLET BY MOUTH TWICE A DAY  . losartan (COZAAR) 25 MG tablet Take 1 tablet (25 mg total) by mouth daily.  . pregabalin (LYRICA) 75 MG capsule Take 1 capsule (75 mg total) by mouth 2 (two) times daily.  . [DISCONTINUED] potassium chloride 20 MEQ TBCR Take 10 mEq by mouth 2 (two) times daily.   No facility-administered encounter medications on file as of 09/03/2018.      ROS: Pertinent positives and negatives noted in HPI. Remainder of ROS non-contributory    Allergies  Allergen Reactions  . Gabapentin Rash    Possibly caused rash, was concurrently on valtrex.   . Lisinopril     REACTION: cough  . Valtrex [Valacyclovir Hcl] Other (See Comments)    Possible cause of rash, was concurrently on gabapentin    BP 120/78   Pulse 68   Temp 97.6 F (36.4 C) (Oral)   Ht 5' (1.524 m)   Wt 130 lb 9.6 oz (59.2 kg)   SpO2 96%   BMI 25.51 kg/m   BP Readings from Last 3 Encounters:  09/03/18 120/78  08/04/18 132/76  07/29/18 138/70     Physical Exam  Constitutional: She is oriented to person, place, and time. She appears well-developed and well-nourished. No distress.  HENT:  Head: Normocephalic and atraumatic.  Right Ear: Hearing, tympanic membrane, external ear and ear canal normal.  Left Ear: Hearing, tympanic membrane, external ear and ear canal normal.  Nose: Nose normal. No mucosal edema or rhinorrhea. Right sinus exhibits no maxillary sinus tenderness and no frontal sinus tenderness. Left sinus exhibits no maxillary sinus tenderness and no frontal sinus tenderness.  Mouth/Throat: Mucous membranes are normal. No posterior oropharyngeal edema or posterior oropharyngeal erythema.  Neck: Normal range of motion. Neck supple.  Abdominal: Soft. Bowel sounds are normal.  She exhibits no distension. There is no abdominal tenderness.  Musculoskeletal:        General: No tenderness (no CVA TTP).  Lymphadenopathy:    She has no cervical adenopathy.  Neurological: She is alert and oriented to person, place, and time.  Skin: Skin is warm and dry.  Psychiatric: She has a normal mood and affect. Her behavior is normal.     A/P:  1. Dysuria 2. UTI symptoms - POCT  Urinalysis Dipstick  - Urine Culture - pt is very anxious about her health and symptoms. We agreed to send culture but will start abx today with plan to stop if culture is negative Rx: - nitrofurantoin, macrocrystal-monohydrate, (MACROBID) 100 MG capsule; Take 1 capsule (100 mg total) by mouth 2 (two) times daily for 5 days.  Dispense: 10 capsule; Refill: 0 Discussed plan and reviewed medications with patient, including risks, benefits, and potential side effects. Pt expressed understand. All questions answered.  3. Dry mouth - could be medication related (? Lyrica, hctz) vs other etiology - pt drinking more water daily but still with symptoms and she feels worsening symptoms - "like sand in my mouth" - advised pt to suck on hard candy or try biotene oral rinse - can also consider stopping or decreasing dose of lyrica - pt asked that I discuss with PCP   4. Tinnitus, right ear - pt has tinnitus but continues to complain of a "banging" that she "hears" in her head. BP controlled, no sinus/allergy symptoms, no headache. I am not able to fully understand symptoms pt is describing. Could consider ENT referral, CT head. Pt is very anxious about symptoms. Will discuss with pts PCP and call pt/husband tomorrow

## 2018-09-04 ENCOUNTER — Encounter: Payer: Self-pay | Admitting: Family Medicine

## 2018-09-04 LAB — URINE CULTURE
MICRO NUMBER:: 3043
SPECIMEN QUALITY:: ADEQUATE

## 2018-09-08 ENCOUNTER — Telehealth: Payer: Self-pay

## 2018-09-08 MED ORDER — PREGABALIN 50 MG PO CAPS: 50.0000 mg | ORAL_CAPSULE | Freq: Two times a day (BID) | ORAL | 1 refills | Status: DC

## 2018-09-08 NOTE — Telephone Encounter (Signed)
eRx sent for 50 mg twice daily just in case she is unable to wean to daily, she will have enough for the month.

## 2018-09-08 NOTE — Telephone Encounter (Signed)
Spoke with pt , she states that Dr.Aron agreed to refill her Pregablin if she would go down to 50mg  daily. Pt is requesting a refill on Pregablin. Please advise

## 2018-09-09 ENCOUNTER — Other Ambulatory Visit: Payer: Self-pay | Admitting: Family Medicine

## 2018-09-10 ENCOUNTER — Other Ambulatory Visit: Payer: Self-pay | Admitting: Family Medicine

## 2018-09-10 ENCOUNTER — Encounter: Payer: Self-pay | Admitting: Family Medicine

## 2018-09-10 MED ORDER — PREGABALIN 50 MG PO CAPS
50.0000 mg | ORAL_CAPSULE | Freq: Two times a day (BID) | ORAL | 1 refills | Status: DC
Start: 2018-09-10 — End: 2018-11-06

## 2018-09-20 ENCOUNTER — Other Ambulatory Visit: Payer: Self-pay | Admitting: Family Medicine

## 2018-09-22 ENCOUNTER — Other Ambulatory Visit: Payer: Self-pay | Admitting: Family Medicine

## 2018-09-22 ENCOUNTER — Other Ambulatory Visit: Payer: Self-pay | Admitting: Physician Assistant

## 2018-09-22 ENCOUNTER — Other Ambulatory Visit (HOSPITAL_COMMUNITY): Payer: Self-pay | Admitting: Physician Assistant

## 2018-09-22 DIAGNOSIS — H93A9 Pulsatile tinnitus, unspecified ear: Secondary | ICD-10-CM | POA: Diagnosis not present

## 2018-09-22 DIAGNOSIS — H903 Sensorineural hearing loss, bilateral: Secondary | ICD-10-CM | POA: Diagnosis not present

## 2018-09-24 ENCOUNTER — Other Ambulatory Visit: Payer: Self-pay

## 2018-09-24 ENCOUNTER — Telehealth: Payer: Self-pay | Admitting: Family Medicine

## 2018-09-24 DIAGNOSIS — E876 Hypokalemia: Secondary | ICD-10-CM

## 2018-09-24 DIAGNOSIS — I10 Essential (primary) hypertension: Secondary | ICD-10-CM

## 2018-09-24 DIAGNOSIS — R519 Headache, unspecified: Secondary | ICD-10-CM

## 2018-09-24 DIAGNOSIS — E7800 Pure hypercholesterolemia, unspecified: Secondary | ICD-10-CM

## 2018-09-24 DIAGNOSIS — R51 Headache: Secondary | ICD-10-CM

## 2018-09-24 MED ORDER — ATORVASTATIN CALCIUM 10 MG PO TABS: 10.0000 mg | ORAL_TABLET | Freq: Every day | ORAL | 2 refills | Status: DC

## 2018-09-24 MED ORDER — AMLODIPINE BESYLATE 5 MG PO TABS: ORAL_TABLET | ORAL | 0 refills | Status: DC

## 2018-09-24 MED ORDER — POTASSIUM CHLORIDE CRYS ER 20 MEQ PO TBCR: 20.0000 meq | EXTENDED_RELEASE_TABLET | Freq: Two times a day (BID) | ORAL | 2 refills | Status: DC

## 2018-09-24 MED ORDER — LOSARTAN POTASSIUM 25 MG PO TABS: 25.0000 mg | ORAL_TABLET | Freq: Every day | ORAL | 2 refills | Status: DC

## 2018-09-24 MED ORDER — LOSARTAN POTASSIUM 25 MG PO TABS: 25.0000 mg | ORAL_TABLET | Freq: Every day | ORAL | 3 refills | Status: DC

## 2018-09-24 MED ORDER — ATORVASTATIN CALCIUM 10 MG PO TABS: 10.0000 mg | ORAL_TABLET | Freq: Every day | ORAL | 0 refills | Status: DC

## 2018-09-24 MED ORDER — POTASSIUM CHLORIDE CRYS ER 20 MEQ PO TBCR: 20.0000 meq | EXTENDED_RELEASE_TABLET | Freq: Two times a day (BID) | ORAL | 0 refills | Status: DC

## 2018-09-24 MED ORDER — POTASSIUM CHLORIDE CRYS ER 20 MEQ PO TBCR: 20.0000 meq | EXTENDED_RELEASE_TABLET | Freq: Two times a day (BID) | ORAL | Status: DC

## 2018-09-24 NOTE — Addendum Note (Signed)
Addended by: Marrion Coy on: 09/24/2018 04:34 PM   Modules accepted: Orders

## 2018-09-24 NOTE — Telephone Encounter (Signed)
I called pharmacy and clarified their questions. They just needed verbal stating if she needs brand or generic of potassium chloride

## 2018-09-24 NOTE — Telephone Encounter (Signed)
Copied from [place] [id number]. Topic: Quick Communication - See Telephone Encounter >> Sep 24, 2018  2:36 PM Conception Chancy, NT wrote: CRM for notification. See Telephone encounter for: 09/24/18.  [facility] is calling and states they are needing to know if the patient is supposed to be on the generic or brand of potassium chloride SA (KLOR-CON M20) 20 MEQ tablet. She states the patient is saying she has always used the brand name but generic was sent over. Please contact.   [facility] [address] [place] [address] [place] [address] Phone: [phone] Fax: [phone]

## 2018-09-24 NOTE — Telephone Encounter (Signed)
Completed/thx dmf 

## 2018-09-24 NOTE — Telephone Encounter (Signed)
Patient walked in and wants to get refills on atorvastatin 10mg  90d/s, Klor-con M20 20MEG 90d/s and losartan 25mg  90d/s. Pt is out of refills but he also wants to change pharmacies and have [facility] removed from his list and to send new scripts to [facility], [place], [place] [address]

## 2018-09-28 ENCOUNTER — Ambulatory Visit
Admission: RE | Admit: 2018-09-28 | Discharge: 2018-09-28 | Disposition: A | Discharge status: Home / Self Care | Payer: Medicare Other | Source: Ambulatory Visit | Attending: Physician Assistant | Admitting: Physician Assistant

## 2018-09-28 DIAGNOSIS — H93A9 Pulsatile tinnitus, unspecified ear: Secondary | ICD-10-CM | POA: Diagnosis not present

## 2018-09-28 DIAGNOSIS — R51 Headache: Secondary | ICD-10-CM | POA: Diagnosis not present

## 2018-09-28 DIAGNOSIS — E042 Nontoxic multinodular goiter: Secondary | ICD-10-CM | POA: Diagnosis not present

## 2018-09-28 LAB — POCT I-STAT CREATININE: Creatinine, Ser: 0.8 mg/dL (ref 0.44–1.00)

## 2018-09-28 MED ORDER — IOHEXOL 350 MG/ML SOLN
75.0000 mL | Freq: Once | INTRAVENOUS | Status: AC | PRN
  Administered 2018-09-28: 75 mL via INTRAVENOUS

## 2018-10-13 DIAGNOSIS — H93A9 Pulsatile tinnitus, unspecified ear: Secondary | ICD-10-CM | POA: Diagnosis not present

## 2018-10-13 DIAGNOSIS — I672 Cerebral atherosclerosis: Secondary | ICD-10-CM | POA: Diagnosis not present

## 2018-10-13 DIAGNOSIS — E079 Disorder of thyroid, unspecified: Secondary | ICD-10-CM | POA: Diagnosis not present

## 2018-10-13 DIAGNOSIS — J984 Other disorders of lung: Secondary | ICD-10-CM | POA: Diagnosis not present

## 2018-10-13 DIAGNOSIS — M5013 Cervical disc disorder with radiculopathy, cervicothoracic region: Secondary | ICD-10-CM | POA: Diagnosis not present

## 2018-10-20 ENCOUNTER — Other Ambulatory Visit: Payer: Self-pay | Admitting: Family Medicine

## 2018-10-20 DIAGNOSIS — E876 Hypokalemia: Secondary | ICD-10-CM

## 2018-11-05 ENCOUNTER — Other Ambulatory Visit: Payer: Self-pay | Admitting: Family Medicine

## 2018-11-05 ENCOUNTER — Encounter: Payer: Self-pay | Admitting: Family Medicine

## 2018-11-06 NOTE — Telephone Encounter (Signed)
TA-Patient sent a note message stating that the Lyrica 50mg  bid is working well for her and she is requesting a 90d supply to [facility] as it will be cheaper for her/plz advise/thx dmf

## 2018-11-09 ENCOUNTER — Other Ambulatory Visit: Payer: Self-pay | Admitting: Family Medicine

## 2018-11-09 MED ORDER — PREGABALIN 50 MG PO CAPS: 50.0000 mg | ORAL_CAPSULE | Freq: Two times a day (BID) | ORAL | 1 refills | Status: DC

## 2019-01-19 ENCOUNTER — Other Ambulatory Visit: Payer: Self-pay | Admitting: Family Medicine

## 2019-01-19 DIAGNOSIS — E876 Hypokalemia: Secondary | ICD-10-CM

## 2019-02-08 ENCOUNTER — Encounter: Payer: Self-pay | Admitting: Family Medicine

## 2019-02-08 ENCOUNTER — Telehealth (INDEPENDENT_AMBULATORY_CARE_PROVIDER_SITE_OTHER): Payer: Medicare Other | Admitting: Family Medicine

## 2019-02-08 VITALS — BP 155/79 | HR 90 | Wt 130.0 lb

## 2019-02-08 DIAGNOSIS — I10 Essential (primary) hypertension: Secondary | ICD-10-CM | POA: Diagnosis not present

## 2019-02-08 MED ORDER — LOSARTAN POTASSIUM 50 MG PO TABS: 50.0000 mg | ORAL_TABLET | Freq: Two times a day (BID) | ORAL | 3 refills | Status: DC

## 2019-02-08 MED ORDER — CYCLOBENZAPRINE HCL 5 MG PO TABS: 5.0000 mg | ORAL_TABLET | Freq: Every day | ORAL | 2 refills | Status: DC

## 2019-02-08 NOTE — Assessment & Plan Note (Signed)
Remains high on Cozaar 25 mg twice daily and HCTZ 25 mg daily. We agreed a trial of Cozaar 50 mg twice daily, continue HCTZ 25 mg daily. 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  So I told her if her BP is low at night when she checks it- we can back down to Cozaar 50 mg every morning and 25 mg every night. She will update me in 2 weeks, sooner if she has any concerns. The patient indicates understanding of these issues and agrees with the plan.

## 2019-02-08 NOTE — Progress Notes (Signed)
Virtual Visit via Video   Due to the COVID-19 pandemic, this visit was completed with telemedicine (audio/video) technology to reduce patient and provider exposure as well as to preserve personal protective equipment.   I connected with Robin Peck by a video enabled telemedicine application and verified that I am speaking with the correct person using two identifiers. Location patient: Home Location provider: [facility], Office Persons participating in the virtual visit: Robin Mina, MD   I discussed the limitations of evaluation and management by telemedicine and the availability of in person appointments. The patient expressed understanding and agreed to proceed.  Care Team   Patient Care Team: Robin Passy, MD as PCP - General (Family Medicine)  Subjective:   HPI:   Follow up HTN- she states BP has been elevated around 7 pm when she goes to bed- around 140/87.  She is symptomatic when this occurs- she gets headaches on and off down the back of her head and down her neck.  Quit taking amlodipine 1 month ago as she felt it was keeping her up at night.  The head throbbing started last year- has had a CT angiography of head and neck due to head throbbing, right head and neck pain and right tinnitus on 09/28/18- reviewed below.   IMPRESSION: 1. Atherosclerotic calcifications within the cavernous internal carotid arteries bilaterally without the significant stenosis or change. 2. No significant proximal stenosis, aneurysm, or branch vessel occlusion. 3. Normal CT appearance of the brain without and with contrast. 4. Multinodular goiter without a dominant lesion. 5. No acute or focal lesion in the right neck to explain her pain otherwise.  She is currently taking HCTZ 25 mg daily, Cozaar 25 mg twice daily (increased it from once daily to twice daily since she stopped taking the amlodipine.  She is much less tired but BP is remaining high and she is still  having pounding headaches.  Denies CP or SOB.  Lab Results  Component Value Date   CREATININE 0.80 09/28/2018     BP Readings from Last 3 Encounters:  02/08/19 (!) 155/79  09/03/18 120/78  08/04/18 132/76   Review of Systems  Constitutional: Negative.   HENT: Negative.   Respiratory: Negative.   Cardiovascular: Negative.   Gastrointestinal: Negative.   Genitourinary: Negative.   Musculoskeletal: Negative.   Skin: Negative.   Neurological: Positive for headaches. Negative for dizziness, tingling, tremors, sensory change, speech change, focal weakness, seizures, loss of consciousness and weakness.  Endo/Heme/Allergies: Negative.   Psychiatric/Behavioral: Negative.   All other systems reviewed and are negative.    Patient Active Problem List   Diagnosis Date Noted  . Tinnitus aurium, right 08/04/2018  . Pain management 09/16/2017  . PHN (postherpetic neuralgia) 06/23/2017  . Thyroid lesion 11/19/2016  . Cervical pain (neck) 03/11/2016  . Lung nodule seen on imaging study 08/31/2015  . Panic attack as reaction to stress 06/01/2015  . Hypokalemia 08/02/2014  . DJD (degenerative joint disease) of cervical spine 02/17/2013  . HLD (hyperlipidemia) 11/08/2010  . Essential hypertension 11/08/2010  . ALLERGIC RHINITIS 11/08/2010    Social History   Tobacco Use  . Smoking status: Never Smoker  . Smokeless tobacco: Never Used  Substance Use Topics  . Alcohol use: No    Current Outpatient Medications:  .  ALPRAZolam (XANAX) 0.25 MG tablet, Take 1 tablet (0.25 mg total) by mouth 2 (two) times daily as needed for anxiety or sleep., Disp: 45 tablet, Rfl: 0 .  aspirin EC 81 MG tablet, Take 81 mg by mouth daily., Disp: , Rfl:  .  atorvastatin (LIPITOR) 10 MG tablet, Take 1 tablet (10 mg total) by mouth daily., Disp: 90 tablet, Rfl: 2 .  cetirizine (ZYRTEC) 10 MG tablet, TAKE 1 TABLET (10 MG TOTAL) BY MOUTH DAILY., Disp: 90 tablet, Rfl: 0 .  cyclobenzaprine (FLEXERIL) 5 MG  tablet, Take 1 tablet (5 mg total) by mouth at bedtime., Disp: 30 tablet, Rfl: 2 .  hydrochlorothiazide (HYDRODIURIL) 25 MG tablet, TAKE 1 TABLET BY MOUTH EVERY DAY, Disp: 90 tablet, Rfl: 1 .  KLOR-CON M20 20 MEQ tablet, TAKE 1 TABLET BY MOUTH TWICE A DAY, Disp: 180 tablet, Rfl: 0 .  pregabalin (LYRICA) 50 MG capsule, Take 1 capsule (50 mg total) by mouth 2 (two) times daily., Disp: 180 capsule, Rfl: 1 .  losartan (COZAAR) 50 MG tablet, Take 1 tablet (50 mg total) by mouth 2 (two) times daily., Disp: 90 tablet, Rfl: 3  Allergies  Allergen Reactions  . Gabapentin Rash    Possibly caused rash, was concurrently on valtrex.   . Lisinopril     REACTION: cough  . Valtrex [Valacyclovir Hcl] Other (See Comments)    Possible cause of rash, was concurrently on gabapentin    Objective:  BP (!) 155/79   Pulse 90   Wt 130 lb (59 kg)   SpO2 97%   BMI 25.39 kg/m   VITALS: Per patient if applicable, see vitals. GENERAL: Alert, appears well and in no acute distress. HEENT: Atraumatic, conjunctiva clear, no obvious abnormalities on inspection of external nose and ears. NECK: Normal movements of the head and neck. CARDIOPULMONARY: No increased WOB. Speaking in clear sentences. I:E ratio WNL.  MS: Moves all visible extremities without noticeable abnormality. PSYCH: Pleasant and cooperative, well-groomed. Speech normal rate and rhythm. Affect is appropriate. Insight and judgement are appropriate. Attention is focused, linear, and appropriate.  NEURO: CN grossly intact. Oriented as arrived to appointment on time with no prompting. Moves both UE equally.  SKIN: No obvious lesions, wounds, erythema, or cyanosis noted on face or hands.  Depression screen [facility] 2/9 07/29/2018 06/23/2017 02/05/2017  Decreased Interest 0 0 0  Down, Depressed, Hopeless 0 0 0  PHQ - 2 Score 0 0 0    Assessment and Plan:   Robin Peck was seen today for hypertension and addendum.  Diagnoses and all orders for this visit:   Essential hypertension  Other orders -     cyclobenzaprine (FLEXERIL) 5 MG tablet; Take 1 tablet (5 mg total) by mouth at bedtime. -     losartan (COZAAR) 50 MG tablet; Take 1 tablet (50 mg total) by mouth 2 (two) times daily.    Marland Kitchen COVID-19 Education: The signs and symptoms of COVID-19 were discussed with the patient and how to seek care for testing if needed. The importance of social distancing was discussed today. . Reviewed expectations re: course of current medical issues. . Discussed self-management of symptoms. . Outlined signs and symptoms indicating need for more acute intervention. . Patient verbalized understanding and all questions were answered. Marland Kitchen Health Maintenance issues including appropriate healthy diet, exercise, and smoking avoidance were discussed with patient. . See orders for this visit as documented in the electronic medical record.  Arnette Norris, MD  Records requested if needed. Time spent: 15 minutes, of which >50% was spent in obtaining information about her symptoms, reviewing her previous labs, evaluations, and treatments, counseling her about her condition (please see the discussed topics  above), and developing a plan to further investigate it; she had a number of questions which I addressed.

## 2019-04-27 ENCOUNTER — Other Ambulatory Visit: Payer: Self-pay | Admitting: Family Medicine

## 2019-04-27 DIAGNOSIS — E876 Hypokalemia: Secondary | ICD-10-CM

## 2019-06-10 ENCOUNTER — Other Ambulatory Visit: Payer: Self-pay | Admitting: Family Medicine

## 2019-07-22 ENCOUNTER — Other Ambulatory Visit: Payer: Self-pay | Admitting: Family Medicine

## 2019-08-03 ENCOUNTER — Other Ambulatory Visit: Payer: Self-pay

## 2019-08-03 ENCOUNTER — Other Ambulatory Visit: Payer: Self-pay | Admitting: Family Medicine

## 2019-08-03 DIAGNOSIS — E876 Hypokalemia: Secondary | ICD-10-CM

## 2019-08-03 MED ORDER — POTASSIUM CHLORIDE CRYS ER 20 MEQ PO TBCR: 20.0000 meq | EXTENDED_RELEASE_TABLET | Freq: Two times a day (BID) | ORAL | 0 refills | Status: DC

## 2019-09-05 ENCOUNTER — Other Ambulatory Visit: Payer: Self-pay | Admitting: Family Medicine

## 2019-09-05 DIAGNOSIS — E7800 Pure hypercholesterolemia, unspecified: Secondary | ICD-10-CM

## 2019-09-06 NOTE — Telephone Encounter (Signed)
Last OV 02/08/19 Last fill 09/24/18  #90/2 Patient need a follow up visit before any other refills.

## 2019-09-21 ENCOUNTER — Encounter: Payer: Self-pay | Admitting: Emergency Medicine

## 2019-09-21 ENCOUNTER — Other Ambulatory Visit: Payer: Self-pay

## 2019-09-21 ENCOUNTER — Observation Stay
Admission: EM | Admit: 2019-09-21 | Discharge: 2019-09-22 | Disposition: A | Discharge status: Home / Self Care | Payer: Medicare Other | Attending: Internal Medicine | Admitting: Internal Medicine

## 2019-09-21 DIAGNOSIS — Z90710 Acquired absence of both cervix and uterus: Secondary | ICD-10-CM | POA: Insufficient documentation

## 2019-09-21 DIAGNOSIS — R000 Tachycardia, unspecified: Secondary | ICD-10-CM | POA: Diagnosis not present

## 2019-09-21 DIAGNOSIS — Z20822 Contact with and (suspected) exposure to covid-19: Secondary | ICD-10-CM | POA: Insufficient documentation

## 2019-09-21 DIAGNOSIS — F430 Acute stress reaction: Secondary | ICD-10-CM | POA: Insufficient documentation

## 2019-09-21 DIAGNOSIS — I160 Hypertensive urgency: Secondary | ICD-10-CM | POA: Insufficient documentation

## 2019-09-21 DIAGNOSIS — R42 Dizziness and giddiness: Secondary | ICD-10-CM | POA: Insufficient documentation

## 2019-09-21 DIAGNOSIS — R0789 Other chest pain: Secondary | ICD-10-CM | POA: Diagnosis not present

## 2019-09-21 DIAGNOSIS — R0682 Tachypnea, not elsewhere classified: Secondary | ICD-10-CM | POA: Diagnosis not present

## 2019-09-21 DIAGNOSIS — B0229 Other postherpetic nervous system involvement: Secondary | ICD-10-CM | POA: Insufficient documentation

## 2019-09-21 DIAGNOSIS — H9311 Tinnitus, right ear: Secondary | ICD-10-CM | POA: Diagnosis not present

## 2019-09-21 DIAGNOSIS — R519 Headache, unspecified: Secondary | ICD-10-CM | POA: Diagnosis not present

## 2019-09-21 DIAGNOSIS — E876 Hypokalemia: Secondary | ICD-10-CM | POA: Insufficient documentation

## 2019-09-21 DIAGNOSIS — Z823 Family history of stroke: Secondary | ICD-10-CM | POA: Insufficient documentation

## 2019-09-21 DIAGNOSIS — I200 Unstable angina: Secondary | ICD-10-CM

## 2019-09-21 DIAGNOSIS — Z79899 Other long term (current) drug therapy: Secondary | ICD-10-CM | POA: Diagnosis not present

## 2019-09-21 DIAGNOSIS — G4700 Insomnia, unspecified: Secondary | ICD-10-CM | POA: Diagnosis not present

## 2019-09-21 DIAGNOSIS — F410 Panic disorder [episodic paroxysmal anxiety] without agoraphobia: Secondary | ICD-10-CM | POA: Insufficient documentation

## 2019-09-21 DIAGNOSIS — J309 Allergic rhinitis, unspecified: Secondary | ICD-10-CM | POA: Insufficient documentation

## 2019-09-21 DIAGNOSIS — E785 Hyperlipidemia, unspecified: Secondary | ICD-10-CM | POA: Diagnosis not present

## 2019-09-21 DIAGNOSIS — M47812 Spondylosis without myelopathy or radiculopathy, cervical region: Secondary | ICD-10-CM | POA: Insufficient documentation

## 2019-09-21 DIAGNOSIS — I700 Atherosclerosis of aorta: Secondary | ICD-10-CM | POA: Diagnosis not present

## 2019-09-21 DIAGNOSIS — I10 Essential (primary) hypertension: Secondary | ICD-10-CM | POA: Diagnosis not present

## 2019-09-21 DIAGNOSIS — Z8249 Family history of ischemic heart disease and other diseases of the circulatory system: Secondary | ICD-10-CM | POA: Insufficient documentation

## 2019-09-21 DIAGNOSIS — R079 Chest pain, unspecified: Secondary | ICD-10-CM | POA: Diagnosis not present

## 2019-09-21 DIAGNOSIS — Z7982 Long term (current) use of aspirin: Secondary | ICD-10-CM | POA: Insufficient documentation

## 2019-09-21 DIAGNOSIS — M542 Cervicalgia: Secondary | ICD-10-CM | POA: Diagnosis not present

## 2019-09-21 DIAGNOSIS — E871 Hypo-osmolality and hyponatremia: Secondary | ICD-10-CM | POA: Diagnosis not present

## 2019-09-21 DIAGNOSIS — Z888 Allergy status to other drugs, medicaments and biological substances status: Secondary | ICD-10-CM | POA: Insufficient documentation

## 2019-09-21 MED ORDER — NITROGLYCERIN 2 % TD OINT
1.0000 [in_us] | TOPICAL_OINTMENT | Freq: Once | TRANSDERMAL | Status: DC
  Filled 2019-09-21: qty 1

## 2019-09-21 MED ORDER — ASPIRIN 81 MG PO CHEW
324.0000 mg | CHEWABLE_TABLET | Freq: Once | ORAL | Status: AC
  Administered 2019-09-22: 01:00:00 324 mg via ORAL
  Filled 2019-09-21: qty 4

## 2019-09-21 NOTE — ED Triage Notes (Signed)
Pt arrived via POV with reports of chest tightness that started last night and came back this morning, pt states the tightness resembles indigestion. Pt states it comes and goes, no distress noted on arrival.  Pt alert and oriented, HOH.  Pt states it feels like her heart is skipping a beat.

## 2019-09-21 NOTE — ED Provider Notes (Signed)
[facility] Emergency Department Provider Note   ____________________________________________   First MD Initiated Contact with Patient 09/21/19 2350     (approximate)  I have reviewed the triage vital signs and the nursing notes.   HISTORY  Chief Complaint Chest Pain    HPI Robin Peck is a 74 y.o. female who presents to the ED from home with a chief complaint of chest pain.  Patient reports waxing/waning nonradiating central chest tightness that began yesterday evening.  Came back this morning and went away.  Returned tonight associated with shortness of breath dizziness.  Denies diaphoresis, palpitations, nausea/vomiting.  Denies recent travel, trauma or exposure to COVID-19.       Past Medical History:  Diagnosis Date  . Allergic rhinitis   . Hyperlipidemia   . Hypertension     Patient Active Problem List   Diagnosis Date Noted  . Tinnitus aurium, right 08/04/2018  . Pain management 09/16/2017  . PHN (postherpetic neuralgia) 06/23/2017  . Thyroid lesion 11/19/2016  . Cervical pain (neck) 03/11/2016  . Lung nodule seen on imaging study 08/31/2015  . Panic attack as reaction to stress 06/01/2015  . Hypokalemia 08/02/2014  . DJD (degenerative joint disease) of cervical spine 02/17/2013  . HLD (hyperlipidemia) 11/08/2010  . Essential hypertension 11/08/2010  . ALLERGIC RHINITIS 11/08/2010    Past Surgical History:  Procedure Laterality Date  . gyn surgery  2002   hysterectomy    Prior to Admission medications   Medication Sig Start Date End Date Taking? Authorizing Provider  ALPRAZolam (XANAX) 0.25 MG tablet Take 1 tablet (0.25 mg total) by mouth 2 (two) times daily as needed for anxiety or sleep. 07/24/18   Cirigliano, Garvin Fila, DO  aspirin EC 81 MG tablet Take 81 mg by mouth daily.    [provider]  atorvastatin (LIPITOR) 10 MG tablet TAKE ONE TABLET BY MOUTH ONE TIME DAILY  09/06/19   Lucille Passy, MD  cetirizine  (ZYRTEC) 10 MG tablet TAKE 1 TABLET (10 MG TOTAL) BY MOUTH DAILY. 04/29/14   Lucille Passy, MD  cyclobenzaprine (FLEXERIL) 5 MG tablet TAKE ONE TABLET BY MOUTH DAILY AT BEDTIME  06/11/19   Lucille Passy, MD  hydrochlorothiazide (HYDRODIURIL) 25 MG tablet TAKE 1 TABLET BY MOUTH EVERY DAY 08/03/19   Lucille Passy, MD  losartan (COZAAR) 50 MG tablet TAKE ONE TABLET BY MOUTH TWICE DAILY  07/22/19   Lucille Passy, MD  potassium chloride SA (KLOR-CON M20) 20 MEQ tablet Take 1 tablet (20 mEq total) by mouth 2 (two) times daily. 08/03/19   Lucille Passy, MD  pregabalin (LYRICA) 50 MG capsule Take 1 capsule (50 mg total) by mouth 2 (two) times daily. 11/09/18 05/08/19  Lucille Passy, MD  potassium chloride 20 MEQ TBCR Take 10 mEq by mouth 2 (two) times daily. 05/30/15 05/30/15  Palumbo, April, MD    Allergies Valacyclovir hcl, Gabapentin, and Lisinopril  Family History  Problem Relation Age of Onset  . Heart attack Brother   . Hypertension Brother   . Hypertension Father   . Hypertension Mother   . Stroke Mother   . Hypertension Sister   . Heart attack Maternal Grandfather     Social History Social History   Tobacco Use  . Smoking status: Never Smoker  . Smokeless tobacco: Never Used  Substance Use Topics  . Alcohol use: No  . Drug use: No    Review of Systems  Constitutional: No fever/chills Eyes:  No visual changes. ENT: No sore throat. Cardiovascular: Positive for chest pain. Respiratory: Positive for shortness of breath. Gastrointestinal: No abdominal pain.  No nausea, no vomiting.  No diarrhea.  No constipation. Genitourinary: Negative for dysuria. Musculoskeletal: Negative for back pain. Skin: Negative for rash. Neurological: Negative for headaches, focal weakness or numbness.   ____________________________________________   PHYSICAL EXAM:  VITAL SIGNS: ED Triage Vitals  Enc Vitals Group     BP 09/21/19 2346 (!) 192/80     Pulse Rate 09/21/19 2346 (!) 106     Resp 09/21/19  2346 18     Temp 09/21/19 2346 97.9 F (36.6 C)     Temp Source 09/21/19 2346 Oral     SpO2 09/21/19 2346 98 %     Weight 09/21/19 2346 129 lb (58.5 kg)     Height 09/21/19 2346 5' (1.524 m)     Head Circumference --      Peak Flow --      Pain Score 09/21/19 2344 3     Pain Loc --      Pain Edu? --      Excl. in [place]? --     Constitutional: Alert and oriented. Well appearing and in no acute distress. Eyes: Conjunctivae are normal. PERRL. EOMI. Head: Atraumatic. Nose: No congestion/rhinnorhea. Mouth/Throat: Mucous membranes are moist.  Oropharynx non-erythematous. Neck: No stridor.   Cardiovascular: Normal rate, regular rhythm. Grossly normal heart sounds.  Good peripheral circulation. Respiratory: Normal respiratory effort.  No retractions. Lungs CTAB. Gastrointestinal: Soft and nontender. No distention. No abdominal bruits. No CVA tenderness. Musculoskeletal: No lower extremity tenderness nor edema.  No joint effusions. Neurologic:  Normal speech and language. No gross focal neurologic deficits are appreciated. Skin:  Skin is warm, dry and intact. No rash noted. Psychiatric: Mood and affect are normal. Speech and behavior are normal.  ____________________________________________   LABS (all labs ordered are listed, but only abnormal results are displayed)  Labs Reviewed  COMPREHENSIVE METABOLIC PANEL - Abnormal; Notable for the following components:      Result Value   Sodium 134 (*)    Glucose, Bld 115 (*)    All other components within normal limits  RESPIRATORY PANEL BY RT PCR (FLU A&B, COVID)  CULTURE, BLOOD (ROUTINE X 2)  CULTURE, BLOOD (ROUTINE X 2)  CBC  LIPASE, BLOOD  LACTIC ACID, PLASMA  LACTIC ACID, PLASMA  TROPONIN I (HIGH SENSITIVITY)  TROPONIN I (HIGH SENSITIVITY)   ____________________________________________  EKG  ED ECG REPORT I, Dyonna Jaspers J, the attending physician, personally viewed and interpreted this ECG.   Date: 09/22/2019  EKG Time:  2345  Rate: 101  Rhythm: sinus tachycardia  Axis: Normal  Intervals:none  ST&T Change: T wave inversion lead III New T wave inversion from 10/27/2016 ____________________________________________  RADIOLOGY  ED MD interpretation: Opacity at right base; atelectasis versus infiltrate  Official radiology report(s): DG Chest Port 1 View  Result Date: 09/22/2019 CLINICAL DATA:  Chest pain. EXAM: PORTABLE CHEST 1 VIEW COMPARISON:  10/27/2016 FINDINGS: The heart size is mildly enlarged. Aortic calcifications are noted. There is a streaky right basilar airspace opacity. There is no pneumothorax. No large pleural effusion. There is no acute osseous abnormality. IMPRESSION: 1. Streaky right basilar airspace opacity, atelectasis versus infiltrate. 2. Mild cardiomegaly.  Aortic atherosclerosis is noted. Electronically Signed   By: Constance Holster M.D.   On: 09/22/2019 00:17    ____________________________________________   PROCEDURES  Procedure(s) performed (including Critical Care):  Procedures   ____________________________________________  INITIAL IMPRESSION / ASSESSMENT AND PLAN / ED COURSE  As part of my medical decision making, I reviewed the following data within the [place] notes reviewed and incorporated, Labs reviewed, EKG interpreted, Old chart reviewed, Radiograph reviewed, Discussed with admitting physician and Notes from prior ED visits     Jazline B Howze was evaluated in Emergency Department on 09/22/2019 for the symptoms described in the history of present illness. She was evaluated in the context of the global COVID-19 pandemic, which necessitated consideration that the patient might be at risk for infection with the SARS-CoV-2 virus that causes COVID-19. Institutional protocols and algorithms that pertain to the evaluation of patients at risk for COVID-19 are in a state of rapid change based on information released by regulatory bodies  including the CDC and federal and state organizations. These policies and algorithms were followed during the patient's care in the ED.    74 year old female who presents with chest tightness. Differential diagnosis includes, but is not limited to, ACS, aortic dissection, pulmonary embolism, cardiac tamponade, pneumothorax, pneumonia, pericarditis, myocarditis, GI-related causes including esophagitis/gastritis, and musculoskeletal chest wall pain.    Administer aspirin, nitroglycerin paste.  Check cardiac panel, chest x-ray.  Anticipate admission for unstable angina.   Clinical Course as of Sep 21 201  Wed Sep 22, 2019  0157 Chest x-ray noted.  Lactic acid unremarkable.  Patient denies fever or cough.  Will hold antibiotics.   [JS]    Clinical Course User Index [JS] Paulette Blanch, MD     ____________________________________________   FINAL CLINICAL IMPRESSION(S) / ED DIAGNOSES  Final diagnoses:  Nonspecific chest pain  Unstable angina [facility])     ED Discharge Orders    None       Note:  This document was prepared using Dragon voice recognition software and may include unintentional dictation errors.   Paulette Blanch, MD 09/22/19 [phone]

## 2019-09-22 ENCOUNTER — Emergency Department: Payer: Medicare Other

## 2019-09-22 DIAGNOSIS — B0229 Other postherpetic nervous system involvement: Secondary | ICD-10-CM | POA: Diagnosis not present

## 2019-09-22 DIAGNOSIS — R079 Chest pain, unspecified: Secondary | ICD-10-CM | POA: Diagnosis present

## 2019-09-22 DIAGNOSIS — E785 Hyperlipidemia, unspecified: Secondary | ICD-10-CM | POA: Diagnosis not present

## 2019-09-22 DIAGNOSIS — I160 Hypertensive urgency: Secondary | ICD-10-CM | POA: Diagnosis not present

## 2019-09-22 LAB — CBC
HCT: 36.8 % (ref 36.0–46.0)
Hemoglobin: 12.3 g/dL (ref 12.0–15.0)
MCH: 28.5 pg (ref 26.0–34.0)
MCHC: 33.4 g/dL (ref 30.0–36.0)
MCV: 85.2 fL (ref 80.0–100.0)
Platelets: 336 10*3/uL (ref 150–400)
RBC: 4.32 MIL/uL (ref 3.87–5.11)
RDW: 11.8 % (ref 11.5–15.5)
WBC: 6.4 10*3/uL (ref 4.0–10.5)
nRBC: 0 % (ref 0.0–0.2)

## 2019-09-22 LAB — COMPREHENSIVE METABOLIC PANEL
ALT: 13 U/L (ref 0–44)
AST: 17 U/L (ref 15–41)
Albumin: 3.9 g/dL (ref 3.5–5.0)
Alkaline Phosphatase: 90 U/L (ref 38–126)
Anion gap: 10 (ref 5–15)
BUN: 20 mg/dL (ref 8–23)
CO2: 26 mmol/L (ref 22–32)
Calcium: 9.4 mg/dL (ref 8.9–10.3)
Chloride: 98 mmol/L (ref 98–111)
Creatinine, Ser: 0.69 mg/dL (ref 0.44–1.00)
GFR calc Af Amer: >60 mL/min (ref >60)
GFR calc non Af Amer: >60 mL/min (ref >60)
Glucose, Bld: 115 mg/dL — ABNORMAL HIGH (ref 70–99)
Potassium: 3.5 mmol/L (ref 3.5–5.1)
Sodium: 134 mmol/L — ABNORMAL LOW (ref 135–145)
Total Bilirubin: 0.5 mg/dL (ref 0.3–1.2)
Total Protein: 7.5 g/dL (ref 6.5–8.1)

## 2019-09-22 LAB — LIPID PANEL
Cholesterol: 186 mg/dL (ref 0–200)
HDL: 41 mg/dL (ref >40)
LDL Cholesterol: 113 mg/dL — ABNORMAL HIGH (ref 0–99)
Total CHOL/HDL Ratio: 4.5 RATIO
Triglycerides: 162 mg/dL — ABNORMAL HIGH (ref <150)
VLDL: 32 mg/dL (ref 0–40)

## 2019-09-22 LAB — TROPONIN I (HIGH SENSITIVITY)
Troponin I (High Sensitivity): 4 ng/L
Troponin I (High Sensitivity): 4 ng/L (ref <18)
Troponin I (High Sensitivity): 5 ng/L (ref <18)
Troponin I (High Sensitivity): 6 ng/L (ref <18)

## 2019-09-22 LAB — RESPIRATORY PANEL BY RT PCR (FLU A&B, COVID)
Influenza A by PCR: NEGATIVE
Influenza B by PCR: NEGATIVE
SARS Coronavirus 2 by RT PCR: NEGATIVE

## 2019-09-22 LAB — LIPASE, BLOOD: Lipase: 46 U/L (ref 11–51)

## 2019-09-22 LAB — LACTIC ACID, PLASMA: Lactic Acid, Venous: 1 mmol/L (ref 0.5–1.9)

## 2019-09-22 MED ORDER — PROPRANOLOL HCL ER 80 MG PO CP24: 80.0000 mg | ORAL_CAPSULE | Freq: Every day | ORAL | 1 refills | Status: DC

## 2019-09-22 MED ORDER — ALUM & MAG HYDROXIDE-SIMETH 200-200-20 MG/5ML PO SUSP
30.0000 mL | Freq: Once | ORAL | Status: AC
  Administered 2019-09-22: 03:00:00 30 mL via ORAL
  Filled 2019-09-22: qty 30

## 2019-09-22 MED ORDER — PREGABALIN 50 MG PO CAPS
50.0000 mg | ORAL_CAPSULE | Freq: Two times a day (BID) | ORAL | Status: DC
  Administered 2019-09-22 (×2): 50 mg via ORAL
  Filled 2019-09-22 (×2): qty 1

## 2019-09-22 MED ORDER — BUTALBITAL-APAP-CAFFEINE 50-325-40 MG PO TABS
1.0000 | ORAL_TABLET | Freq: Four times a day (QID) | ORAL | Status: DC | PRN
  Administered 2019-09-22: 13:00:00 1 via ORAL
  Filled 2019-09-22: qty 1

## 2019-09-22 MED ORDER — LIDOCAINE VISCOUS HCL 2 % MT SOLN
15.0000 mL | Freq: Once | OROMUCOSAL | Status: AC
  Administered 2019-09-22: 03:00:00 15 mL via ORAL
  Filled 2019-09-22: qty 15

## 2019-09-22 MED ORDER — ALPRAZOLAM 0.25 MG PO TABS
0.2500 mg | ORAL_TABLET | Freq: Two times a day (BID) | ORAL | Status: DC | PRN
  Administered 2019-09-22: 13:00:00 0.25 mg via ORAL
  Filled 2019-09-22: qty 1

## 2019-09-22 MED ORDER — HYDROCHLOROTHIAZIDE 25 MG PO TABS
25.0000 mg | ORAL_TABLET | Freq: Every day | ORAL | Status: DC
  Administered 2019-09-22: 09:00:00 25 mg via ORAL
  Filled 2019-09-22: qty 1

## 2019-09-22 MED ORDER — SUMATRIPTAN SUCCINATE 50 MG PO TABS: 50.0000 mg | ORAL_TABLET | Freq: Once | ORAL | 0 refills | Status: DC | PRN

## 2019-09-22 MED ORDER — BUTALBITAL-APAP-CAFFEINE 50-325-40 MG PO TABS: 1.0000 | ORAL_TABLET | Freq: Four times a day (QID) | ORAL | 0 refills | Status: AC | PRN

## 2019-09-22 MED ORDER — SODIUM CHLORIDE 0.9 % IV SOLN: INTRAVENOUS | Status: DC

## 2019-09-22 MED ORDER — CYCLOBENZAPRINE HCL 10 MG PO TABS: 5.0000 mg | ORAL_TABLET | Freq: Every day | ORAL | Status: DC

## 2019-09-22 MED ORDER — ONDANSETRON HCL 4 MG/2ML IJ SOLN: 4.0000 mg | Freq: Four times a day (QID) | INTRAMUSCULAR | Status: DC | PRN

## 2019-09-22 MED ORDER — MELOXICAM 7.5 MG PO TABS: 7.5000 mg | ORAL_TABLET | Freq: Every day | ORAL | 1 refills | Status: DC

## 2019-09-22 MED ORDER — ZOLPIDEM TARTRATE 5 MG PO TABS: 5.0000 mg | ORAL_TABLET | Freq: Every evening | ORAL | Status: DC | PRN

## 2019-09-22 MED ORDER — LORATADINE 10 MG PO TABS
10.0000 mg | ORAL_TABLET | Freq: Every day | ORAL | Status: DC
  Administered 2019-09-22: 09:00:00 10 mg via ORAL
  Filled 2019-09-22: qty 1

## 2019-09-22 MED ORDER — ENOXAPARIN SODIUM 40 MG/0.4ML ~~LOC~~ SOLN
30.0000 mg | SUBCUTANEOUS | Status: DC
  Administered 2019-09-22: 03:00:00 30 mg via SUBCUTANEOUS
  Filled 2019-09-22: qty 0.4

## 2019-09-22 MED ORDER — LABETALOL HCL 5 MG/ML IV SOLN: 20.0000 mg | INTRAVENOUS | Status: DC | PRN

## 2019-09-22 MED ORDER — BUSPIRONE HCL 7.5 MG PO TABS: 7.5000 mg | ORAL_TABLET | Freq: Two times a day (BID) | ORAL | 0 refills | Status: DC

## 2019-09-22 MED ORDER — ASPIRIN EC 325 MG PO TBEC
325.0000 mg | DELAYED_RELEASE_TABLET | Freq: Every day | ORAL | Status: DC
  Administered 2019-09-22: 09:00:00 325 mg via ORAL
  Filled 2019-09-22: qty 1

## 2019-09-22 MED ORDER — ATORVASTATIN CALCIUM 20 MG PO TABS
10.0000 mg | ORAL_TABLET | Freq: Every day | ORAL | Status: DC
  Administered 2019-09-22: 09:00:00 10 mg via ORAL
  Filled 2019-09-22: qty 1

## 2019-09-22 MED ORDER — LOSARTAN POTASSIUM 50 MG PO TABS
50.0000 mg | ORAL_TABLET | Freq: Two times a day (BID) | ORAL | Status: DC
  Administered 2019-09-22 (×2): 50 mg via ORAL
  Filled 2019-09-22 (×2): qty 1

## 2019-09-22 MED ORDER — ACETAMINOPHEN 325 MG PO TABS: 650.0000 mg | ORAL_TABLET | ORAL | Status: DC | PRN

## 2019-09-22 MED ORDER — ENOXAPARIN SODIUM 40 MG/0.4ML ~~LOC~~ SOLN: 40.0000 mg | SUBCUTANEOUS | Status: DC

## 2019-09-22 MED ORDER — POTASSIUM CHLORIDE CRYS ER 20 MEQ PO TBCR
20.0000 meq | EXTENDED_RELEASE_TABLET | Freq: Two times a day (BID) | ORAL | Status: DC
  Administered 2019-09-22 (×2): 20 meq via ORAL
  Filled 2019-09-22 (×2): qty 1

## 2019-09-22 MED ORDER — PANTOPRAZOLE SODIUM 40 MG PO TBEC
40.0000 mg | DELAYED_RELEASE_TABLET | Freq: Every day | ORAL | Status: DC
  Administered 2019-09-22: 09:00:00 40 mg via ORAL
  Filled 2019-09-22: qty 1

## 2019-09-22 NOTE — ED Notes (Signed)
Pt bp is 141/106. Sharion Settler, NP, notified of BP.

## 2019-09-22 NOTE — ED Notes (Addendum)
Sharion Settler, NP, states that pt BP of 141/106 is not an accurate BP. No new orders placed at this time.

## 2019-09-22 NOTE — H&P (Addendum)
[place] [facility] [place] NAME: Robin Peck    MR#:  [id number]  DATE OF BIRTH:  [birth year]  DATE OF ADMISSION:  09/22/2019  PRIMARY CARE PHYSICIAN: Lucille Passy, MD   REQUESTING/REFERRING PHYSICIAN: Lurline Hare, MD CHIEF COMPLAINT:   Chief Complaint  Patient presents with  . Chest Pain    HISTORY OF PRESENT ILLNESS:  Robin Peck  is a 74 y.o. female with a known history of hypertension, dyslipidemia and allergic rhinitis, who presented to the emergency room with acute onset of midsternal chest pain felt as tightness that started last night and has been intermittent since then with no radiation, nausea or vomiting or diaphoresis.  It felt occasionally as indigestion.  She graded the mild in intensity.  She denies being mild intermittent dyspnea.  The patient denies any cough or wheezing or hemoptysis.  No leg edema or travels.  No reported fever or chills.  No exposure to COVID-19.  She complains of insomnia and states that when she cannot sleep she gets headache with head pounding and tachypnea as well as chest heaviness.  She takes Lyrica for postherpetic neuralgia with associated numbness in the left upper chest with occasional burning and stabbing pain.  Opposition to the emergency room, blood pressure was 192/81 pulse 106 with otherwise normal vital signs.  Labs revealed borderline potassium of 3.5 and mild hyponatremia 134.  Unassisted troponin I was 4 and later the same.  Blood pressure was one.  CBC was unremarkable.  Influenza AMB antigen as well as COVID-19 antigen came back negative.  Chest x-ray showed nonspecific basal airspace disease, atelectasis versus infiltrate with mild cardiomegaly and aortic atherosclerosis. EKG showed sinus tachycardia with rate 101 with possible left atrial enlargement.  She was given 4 baby aspirin and was offered an inch of Nitropaste which she refused.  She was more comfortable during my interview.  She will be admitted to an  observation telemetry bed for further evaluation and monitoring PAST MEDICAL HISTORY:   Past Medical History:  Diagnosis Date  . Allergic rhinitis   . Hyperlipidemia   . Hypertension     PAST SURGICAL HISTORY:   Past Surgical History:  Procedure Laterality Date  . gyn surgery  2002   hysterectomy    SOCIAL HISTORY:   Social History   Tobacco Use  . Smoking status: Never Smoker  . Smokeless tobacco: Never Used  Substance Use Topics  . Alcohol use: No    FAMILY HISTORY:   Family History  Problem Relation Age of Onset  . Heart attack Brother   . Hypertension Brother   . Hypertension Father   . Hypertension Mother   . Stroke Mother   . Hypertension Sister   . Heart attack Maternal Grandfather     DRUG ALLERGIES:   Allergies  Allergen Reactions  . Valacyclovir Hcl Other (See Comments) and Anaphylaxis    Possible cause of rash, was concurrently on gabapentin Possible cause of rash, was concurrently on gabapentin  . Gabapentin Rash    Possibly caused rash, was concurrently on valtrex.   . Lisinopril     REACTION: cough    REVIEW OF SYSTEMS:   ROS As per history of present illness. All pertinent systems were reviewed above. Constitutional,  HEENT, cardiovascular, respiratory, GI, GU, musculoskeletal, neuro, psychiatric, endocrine,  integumentary and hematologic systems were reviewed and are otherwise  negative/unremarkable except for positive findings mentioned above in the HPI.   MEDICATIONS AT HOME:  Prior to Admission medications   Medication Sig Start Date End Date Taking? Authorizing Provider  ALPRAZolam (XANAX) 0.25 MG tablet Take 1 tablet (0.25 mg total) by mouth 2 (two) times daily as needed for anxiety or sleep. 07/24/18   Cirigliano, Garvin Fila, DO  aspirin EC 81 MG tablet Take 81 mg by mouth daily.    [provider]  atorvastatin (LIPITOR) 10 MG tablet TAKE ONE TABLET BY MOUTH ONE TIME DAILY  09/06/19   Lucille Passy, MD  cetirizine  (ZYRTEC) 10 MG tablet TAKE 1 TABLET (10 MG TOTAL) BY MOUTH DAILY. 04/29/14   Lucille Passy, MD  cyclobenzaprine (FLEXERIL) 5 MG tablet TAKE ONE TABLET BY MOUTH DAILY AT BEDTIME  06/11/19   Lucille Passy, MD  hydrochlorothiazide (HYDRODIURIL) 25 MG tablet TAKE 1 TABLET BY MOUTH EVERY DAY 08/03/19   Lucille Passy, MD  losartan (COZAAR) 50 MG tablet TAKE ONE TABLET BY MOUTH TWICE DAILY  07/22/19   Lucille Passy, MD  potassium chloride SA (KLOR-CON M20) 20 MEQ tablet Take 1 tablet (20 mEq total) by mouth 2 (two) times daily. 08/03/19   Lucille Passy, MD  pregabalin (LYRICA) 50 MG capsule Take 1 capsule (50 mg total) by mouth 2 (two) times daily. 11/09/18 05/08/19  Lucille Passy, MD  potassium chloride 20 MEQ TBCR Take 10 mEq by mouth 2 (two) times daily. 05/30/15 05/30/15  Palumbo, April, MD      VITAL SIGNS:  Blood pressure (!) 192/80, pulse (!) 106, temperature 97.9 F (36.6 C), temperature source Oral, resp. rate 18, height 5' (1.524 m), weight 58.5 kg, SpO2 98 %.  PHYSICAL EXAMINATION:  Physical Exam  GENERAL:  74 y.o.-year-old female patient lying in the bed with no acute distress.  EYES: Pupils equal, round, reactive to light and accommodation. No scleral icterus. Extraocular muscles intact.  HEENT: Head atraumatic, normocephalic. Oropharynx and nasopharynx clear.  NECK:  Supple, no jugular venous distention. No thyroid enlargement, no tenderness.  LUNGS: Normal breath sounds bilaterally, no wheezing, rales,rhonchi or crepitation. No use of accessory muscles of respiration.  CARDIOVASCULAR: Regular rate and rhythm, S1, S2 normal. No murmurs, rubs, or gallops.  ABDOMEN: Soft, nondistended, nontender. Bowel sounds present. No organomegaly or mass.  EXTREMITIES: No pedal edema, cyanosis, or clubbing.  NEUROLOGIC: Cranial nerves II through XII are intact. Muscle strength 5/5 in all extremities. Sensation intact. Gait not checked.  PSYCHIATRIC: The patient is alert and oriented x 3.  Normal affect and  good eye contact. SKIN: No obvious rash, lesion, or ulcer.   LABORATORY PANEL:   CBC Recent Labs  Lab 09/22/19 0108  WBC 6.4  HGB 12.3  HCT 36.8  PLT 336   ------------------------------------------------------------------------------------------------------------------  Chemistries  Recent Labs  Lab 09/22/19 0108  NA 134*  K 3.5  CL 98  CO2 26  GLUCOSE 115*  BUN 20  CREATININE 0.69  CALCIUM 9.4  AST 17  ALT 13  ALKPHOS 90  BILITOT 0.5   ------------------------------------------------------------------------------------------------------------------  Cardiac Enzymes No results for input(s): TROPONINI in the last 168 hours. ------------------------------------------------------------------------------------------------------------------  RADIOLOGY:  DG Chest Port 1 View  Result Date: 09/22/2019 CLINICAL DATA:  Chest pain. EXAM: PORTABLE CHEST 1 VIEW COMPARISON:  10/27/2016 FINDINGS: The heart size is mildly enlarged. Aortic calcifications are noted. There is a streaky right basilar airspace opacity. There is no pneumothorax. No large pleural effusion. There is no acute osseous abnormality. IMPRESSION: 1. Streaky right basilar airspace opacity, atelectasis versus infiltrate. 2. Mild cardiomegaly.  Aortic atherosclerosis is noted. Electronically Signed   By: Constance Holster M.D.   On: 09/22/2019 00:17      IMPRESSION AND PLAN:   1.Chest pain, rule out acute coronary syndrome.  The patient will be admitted to an observation telemetry bed.  Will follow serial cardiac enzymes and EKGs.  We will obtain a cardiology consult in a.m. for further cardiac risk stratification.  The patient will be placed on aspirin as well as p.r.n. sublingual nitroglycerin and morphine sulfate for pain.  I notified Dr. Crissie Sickles about the patient.  2.  Hypertensive urgency.  This could be the culprit but main contributor to #1.  She will be placed on as needed IV labetalol.  Will continue  HCTZ and Cozaar.  3.  Dyslipidemia.  We will continue Lipitor.  Will check fasting lipids.   4.  Postherpetic neuralgia.  We will continue Lyrica.  5.  Panic disorder.  She has associated insomnia.  We will place her on as needed Ambien.  We willl continue Xanax.  6.  DVT prophylaxis.  Subcutaneous Lovenox.  7.  GI prophylaxis.  PPI therapy given possibility of GI etiology.   All the records are reviewed and case discussed with ED provider. The plan of care was discussed in details with the patient (and family). I answered all questions. The patient agreed to proceed with the above mentioned plan. Further management will depend upon hospital course.   CODE STATUS: Full code  TOTAL TIME TAKING CARE OF THIS PATIENT: 55 minutes.    Christel Mormon M.D on 09/22/2019 at 3:01 AM  [facility]   From 7 PM-7 AM, contact night-coverage [email or web address]  CC: Primary care physician; Lucille Passy, MD   Note: This dictation was prepared with Dragon dictation along with smaller phrase technology. Any transcriptional errors that result from this process are unintentional.

## 2019-09-22 NOTE — Progress Notes (Signed)
IV removed from patient. Discharge instructions given to patient. Verbalized understanding. No acute distress at this time. Patient to call husband to transport her home.  

## 2019-09-22 NOTE — Discharge Summary (Signed)
Physician Discharge Summary  Robin Peck [id number] DOB: 1946-08-20 DOA: 09/21/2019  PCP: Lucille Passy, MD  Admit date: 09/21/2019 Discharge date: 09/22/2019  Admitted From: Home Disposition:  Home  Recommendations for Outpatient Follow-up:  1. Follow up with PCP in 1-2 weeks 2. Please obtain BMP/CBC in one week 3. Recommend referral to neurology for management of frequent migraines 4. Address anxiety.  Started on Buspar due to Xanax causing sedation, per patient 5. Address headaches.  Started on propranolol for prevention, Imitrex for abortive therapy.  Fioricet as needed if not controlled with other medications.   Home Health: No  Equipment/Devices: None   Discharge Condition: Stable  CODE STATUS: Full  Diet recommendation: Heart Healthy  Brief/Interim Summary:  Head pounding every day --> heart races --> anxiety --> heart races more and BP goes up Propranolol, Imitrex, Fioricet, Buspar  74 y.o. female with a history of hypertension, dyslipidemia and allergic rhinitis, who presented to the emergency room with midsternal chest pain/tightness.  In the ED, BP elevated 192/81, HR 106. Notable labs K 3.5, troponins trended and remained negative.  Flu A/B and Covid 19 negative.  CXR nonspecific basilar atelectasis vs infiltrate, mild cardiomegaly.  Patient without respiratory symptoms, fever or chills.  ECG sinus tach 101, possible LAE.  Treated in the ED with 4 baby aspirin and was offered an inch of Nitropaste which she refused due to headache.  Admitted for observation and ACS rule out. Chest pain resolved.  Headache persisted.  She reported episodes of pounding headaches daily for months.  Describes heart racing and elevated blood pressures with this chest discomfort as a result of headaches.  Describes pounding headaches, light sensitivity, insomnia.  Acknowledges she has significant anxiety, and when headache pain leads to her heart racing, she gets more anxious and symptoms  worsen.  Also reports taking Lyrica for postherpetic neuralgia with associated numbness in the left upper chest with occasional burning and stabbing pain.  Patient agreeable to initiate migraine prophylaxis with propranolol and Imitrex for as needed abortive headache therapy.  Buspar initiated for anxiety with patient agreeable to close PCP follow up.  Agrees to monitor blood pressure at home and keep headache diary.  Discharge Diagnoses: Active Problems:   Chest pain    Discharge Instructions   Discharge Instructions    Call MD for:   Complete by: As directed    Headaches not improving after 2 weeks of new medications.   Call MD for:  severe uncontrolled pain   Complete by: As directed    Diet - low sodium heart healthy   Complete by: As directed    Discharge instructions   Complete by: As directed    Take propranolol daily for prevention of migraine headaches.  Take Imitrex (aka sumatriptan) right away when headache starts.  If not better, can take another dose 2 hours later. If Imitrex does not relieve headache, take Fioricet (aka butalbital-acetaminophen-caffeine).  Please see your primary care doctor within 1 week for follow up to discuss these new medications. Frequent migraines are often best managed by a neurologist.  Recommend referral to neurologist by your primary care doctor.   Increase activity slowly   Complete by: As directed      Allergies as of 09/22/2019      Reactions   Valacyclovir Hcl Other (See Comments), Anaphylaxis   Possible cause of rash, was concurrently on gabapentin Possible cause of rash, was concurrently on gabapentin   Gabapentin Rash   Possibly caused rash, was  concurrently on valtrex.    Lisinopril    REACTION: cough      Medication List    TAKE these medications   ALPRAZolam 0.25 MG tablet Commonly known as: XANAX Take 1 tablet (0.25 mg total) by mouth 2 (two) times daily as needed for anxiety or sleep.   aspirin EC 81 MG tablet Take  81 mg by mouth daily.   atorvastatin 10 MG tablet Commonly known as: LIPITOR TAKE ONE TABLET BY MOUTH ONE TIME DAILY   busPIRone 7.5 MG tablet Commonly known as: BUSPAR Take 1 tablet (7.5 mg total) by mouth 2 (two) times daily.   butalbital-acetaminophen-caffeine 50-325-40 MG tablet Commonly known as: FIORICET Take 1 tablet by mouth every 6 (six) hours as needed for up to 2 days for headache.   cetirizine 10 MG tablet Commonly known as: ZYRTEC TAKE 1 TABLET (10 MG TOTAL) BY MOUTH DAILY. What changed: See the new instructions.   cyclobenzaprine 5 MG tablet Commonly known as: FLEXERIL TAKE ONE TABLET BY MOUTH DAILY AT BEDTIME What changed:   when to take this  reasons to take this   hydrochlorothiazide 25 MG tablet Commonly known as: HYDRODIURIL TAKE 1 TABLET BY MOUTH EVERY DAY   losartan 50 MG tablet Commonly known as: COZAAR TAKE ONE TABLET BY MOUTH TWICE DAILY   meloxicam 7.5 MG tablet Commonly known as: Mobic Take 1 tablet (7.5 mg total) by mouth daily.   potassium chloride SA 20 MEQ tablet Commonly known as: Klor-Con M20 Take 1 tablet (20 mEq total) by mouth 2 (two) times daily.   pregabalin 50 MG capsule Commonly known as: LYRICA Take 1 capsule (50 mg total) by mouth 2 (two) times daily.   propranolol ER 80 MG 24 hr capsule Commonly known as: Inderal LA Take 1 capsule (80 mg total) by mouth daily.   SUMAtriptan 50 MG tablet Commonly known as: Imitrex Take 1 tablet (50 mg total) by mouth once as needed for migraine. May repeat in 2 hours if headache persists or recurs.      Follow-up Information    Lucille Passy, MD. Schedule an appointment as soon as possible for a visit in 1 week(s).   Specialty: Family Medicine Contact information: [address] [place] [address] [phone]          Allergies  Allergen Reactions  . Valacyclovir Hcl Other (See Comments) and Anaphylaxis    Possible cause of rash, was concurrently on  gabapentin Possible cause of rash, was concurrently on gabapentin  . Gabapentin Rash    Possibly caused rash, was concurrently on valtrex.   . Lisinopril     REACTION: cough    Consultations:  None    Procedures/Studies: DG Chest Port 1 View  Result Date: 09/22/2019 CLINICAL DATA:  Chest pain. EXAM: PORTABLE CHEST 1 VIEW COMPARISON:  10/27/2016 FINDINGS: The heart size is mildly enlarged. Aortic calcifications are noted. There is a streaky right basilar airspace opacity. There is no pneumothorax. No large pleural effusion. There is no acute osseous abnormality. IMPRESSION: 1. Streaky right basilar airspace opacity, atelectasis versus infiltrate. 2. Mild cardiomegaly.  Aortic atherosclerosis is noted. Electronically Signed   By: Constance Holster M.D.   On: 09/22/2019 00:17       Subjective: No acute events reported since admission.  Patient reports her usual headache.  Chest pain better.  No fever.chills, cough, or other respiratory symptoms except intermittent dyspnea she attributes to anxiety provoked by pounding headaches.   Discharge Exam: Vitals:  09/22/19 0830 09/22/19 0945  BP:    Pulse: 68 69  Resp: 14 17  Temp:    SpO2: 100% 100%   Vitals:   09/22/19 0530 09/22/19 0630 09/22/19 0830 09/22/19 0945  BP: 102/73 114/74    Pulse: 69 70 68 69  Resp: 18 (!) 22 14 17   Temp:      TempSrc:      SpO2: 96% 98% 100% 100%  Weight:      Height:        General: Pt is alert, awake, not in acute distress Cardiovascular: RRR, S1/S2 +, no rubs, no gallops Respiratory: CTA bilaterally, no wheezing, no rhonchi Abdominal: Soft, NT, ND, bowel sounds + Extremities: no edema, no cyanosis    The results of significant diagnostics from this hospitalization (including imaging, microbiology, ancillary and laboratory) are listed below for reference.     Microbiology: Recent Results (from the past 240 hour(s))  Respiratory Panel by RT PCR (Flu A&B, Covid) - Nasopharyngeal Swab      Status: None   Collection Time: 09/22/19  1:08 AM   Specimen: Nasopharyngeal Swab  Result Value Ref Range Status   SARS Coronavirus 2 by RT PCR NEGATIVE NEGATIVE Final    Comment: (NOTE) SARS-CoV-2 target nucleic acids are NOT DETECTED. The SARS-CoV-2 RNA is generally detectable in upper respiratoy specimens during the acute phase of infection. The lowest concentration of SARS-CoV-2 viral copies this assay can detect is 131 copies/mL. A negative result does not preclude SARS-Cov-2 infection and should not be used as the sole basis for treatment or other patient management decisions. A negative result may occur with  improper specimen collection/handling, submission of specimen other than nasopharyngeal swab, presence of viral mutation(s) within the areas targeted by this assay, and inadequate number of viral copies (<131 copies/mL). A negative result must be combined with clinical observations, patient history, and epidemiological information. The expected result is Negative. Fact Sheet for Patients:  PinkCheek.be Fact Sheet for Healthcare Providers:  GravelBags.it This test is not yet ap proved or cleared by the [place] FDA and  has been authorized for detection and/or diagnosis of SARS-CoV-2 by FDA under an Emergency Use Authorization (EUA). This EUA will remain  in effect (meaning this test can be used) for the duration of the COVID-19 declaration under Section 564(b)(1) of the Act, 21 U.S.C. section 360bbb-3(b)(1), unless the authorization is terminated or revoked sooner.    Influenza A by PCR NEGATIVE NEGATIVE Final   Influenza B by PCR NEGATIVE NEGATIVE Final    Comment: (NOTE) The Xpert Xpress SARS-CoV-2/FLU/RSV assay is intended as an aid in  the diagnosis of influenza from Nasopharyngeal swab specimens and  should not be used as a sole basis for treatment. Nasal washings and  aspirates are unacceptable for  Xpert Xpress SARS-CoV-2/FLU/RSV  testing. Fact Sheet for Patients: PinkCheek.be Fact Sheet for Healthcare Providers: GravelBags.it This test is not yet approved or cleared by the [place] FDA and  has been authorized for detection and/or diagnosis of SARS-CoV-2 by  FDA under an Emergency Use Authorization (EUA). This EUA will remain  in effect (meaning this test can be used) for the duration of the  Covid-19 declaration under Section 564(b)(1) of the Act, 21  U.S.C. section 360bbb-3(b)(1), unless the authorization is  terminated or revoked. Performed at [facility], [address] [place], [place] [address]   Culture, blood (routine x 2)     Status: None (Preliminary result)   Collection Time: 09/22/19  1:08 AM   Specimen: BLOOD  Result Value Ref Range Status   Specimen Description BLOOD RIGHT ANTECUBITAL  Final   Special Requests   Final    BOTTLES DRAWN AEROBIC AND ANAEROBIC Blood Culture adequate volume   Culture   Final    NO GROWTH < 12 HOURS Performed at [facility], [address], [place], [place] [address]    Report Status PENDING  Incomplete  Culture, blood (routine x 2)     Status: None (Preliminary result)   Collection Time: 09/22/19  1:08 AM   Specimen: BLOOD  Result Value Ref Range Status   Specimen Description BLOOD LEFT ANTECUBITAL  Final   Special Requests   Final    BOTTLES DRAWN AEROBIC AND ANAEROBIC Blood Culture adequate volume   Culture   Final    NO GROWTH < 12 HOURS Performed at [facility], [address] [place], [place] [address]    Report Status PENDING  Incomplete     Labs: BNP (last 3 results) No results for input(s): BNP in the last 8760 hours. Basic Metabolic Panel: Recent Labs  Lab 09/22/19 0108  NA 134*  K 3.5  CL 98  CO2 26  GLUCOSE 115*  BUN 20  CREATININE 0.69  CALCIUM 9.4   Liver Function Tests: Recent Labs  Lab  09/22/19 0108  AST 17  ALT 13  ALKPHOS 90  BILITOT 0.5  PROT 7.5  ALBUMIN 3.9   Recent Labs  Lab 09/22/19 0108  LIPASE 46   No results for input(s): AMMONIA in the last 168 hours. CBC: Recent Labs  Lab 09/22/19 0108  WBC 6.4  HGB 12.3  HCT 36.8  MCV 85.2  PLT 336   Cardiac Enzymes: No results for input(s): CKTOTAL, CKMB, CKMBINDEX, TROPONINI in the last 168 hours. BNP: Invalid input(s): POCBNP CBG: No results for input(s): GLUCAP in the last 168 hours. D-Dimer No results for input(s): DDIMER in the last 72 hours. Hgb A1c No results for input(s): HGBA1C in the last 72 hours. Lipid Profile Recent Labs    09/22/19 0327  CHOL 186  HDL 41  LDLCALC 113*  TRIG 162*  CHOLHDL 4.5   Thyroid function studies No results for input(s): TSH, T4TOTAL, T3FREE, THYROIDAB in the last 72 hours.  Invalid input(s): FREET3 Anemia work up No results for input(s): VITAMINB12, FOLATE, FERRITIN, TIBC, IRON, RETICCTPCT in the last 72 hours. Urinalysis    Component Value Date/Time   COLORURINE YELLOW 01/11/2016 1001   APPEARANCEUR Clear 07/01/2016 0906   LABSPEC 1.010 01/11/2016 1001   PHURINE 7.0 01/11/2016 1001   GLUCOSEU Negative 07/01/2016 0906   GLUCOSEU NEGATIVE 01/11/2016 1001   HGBUR LARGE (A) 01/11/2016 1001   BILIRUBINUR neg 09/03/2018 1352   BILIRUBINUR Negative 07/01/2016 0906   KETONESUR NEGATIVE 01/11/2016 1001   PROTEINUR Negative 09/03/2018 1352   PROTEINUR Negative 07/01/2016 0906   PROTEINUR NEGATIVE 05/30/2015 0059   UROBILINOGEN 0.2 09/03/2018 1352   UROBILINOGEN 0.2 01/11/2016 1001   NITRITE neg 09/03/2018 1352   NITRITE Negative 07/01/2016 0906   NITRITE NEGATIVE 01/11/2016 1001   LEUKOCYTESUR Trace (A) 09/03/2018 1352   LEUKOCYTESUR 1+ (A) 07/01/2016 0906   Sepsis Labs Invalid input(s): PROCALCITONIN,  WBC,  LACTICIDVEN Microbiology Recent Results (from the past 240 hour(s))  Respiratory Panel by RT PCR (Flu A&B, Covid) - Nasopharyngeal Swab      Status: None   Collection Time: 09/22/19  1:08 AM   Specimen: Nasopharyngeal Swab  Result Value Ref Range  Status   SARS Coronavirus 2 by RT PCR NEGATIVE NEGATIVE Final    Comment: (NOTE) SARS-CoV-2 target nucleic acids are NOT DETECTED. The SARS-CoV-2 RNA is generally detectable in upper respiratoy specimens during the acute phase of infection. The lowest concentration of SARS-CoV-2 viral copies this assay can detect is 131 copies/mL. A negative result does not preclude SARS-Cov-2 infection and should not be used as the sole basis for treatment or other patient management decisions. A negative result may occur with  improper specimen collection/handling, submission of specimen other than nasopharyngeal swab, presence of viral mutation(s) within the areas targeted by this assay, and inadequate number of viral copies (<131 copies/mL). A negative result must be combined with clinical observations, patient history, and epidemiological information. The expected result is Negative. Fact Sheet for Patients:  PinkCheek.be Fact Sheet for Healthcare Providers:  GravelBags.it This test is not yet ap proved or cleared by the [place] FDA and  has been authorized for detection and/or diagnosis of SARS-CoV-2 by FDA under an Emergency Use Authorization (EUA). This EUA will remain  in effect (meaning this test can be used) for the duration of the COVID-19 declaration under Section 564(b)(1) of the Act, 21 U.S.C. section 360bbb-3(b)(1), unless the authorization is terminated or revoked sooner.    Influenza A by PCR NEGATIVE NEGATIVE Final   Influenza B by PCR NEGATIVE NEGATIVE Final    Comment: (NOTE) The Xpert Xpress SARS-CoV-2/FLU/RSV assay is intended as an aid in  the diagnosis of influenza from Nasopharyngeal swab specimens and  should not be used as a sole basis for treatment. Nasal washings and  aspirates are unacceptable for  Xpert Xpress SARS-CoV-2/FLU/RSV  testing. Fact Sheet for Patients: PinkCheek.be Fact Sheet for Healthcare Providers: GravelBags.it This test is not yet approved or cleared by the [place] FDA and  has been authorized for detection and/or diagnosis of SARS-CoV-2 by  FDA under an Emergency Use Authorization (EUA). This EUA will remain  in effect (meaning this test can be used) for the duration of the  Covid-19 declaration under Section 564(b)(1) of the Act, 21  U.S.C. section 360bbb-3(b)(1), unless the authorization is  terminated or revoked. Performed at [facility], [address] [place], [place] [address]   Culture, blood (routine x 2)     Status: None (Preliminary result)   Collection Time: 09/22/19  1:08 AM   Specimen: BLOOD  Result Value Ref Range Status   Specimen Description BLOOD RIGHT ANTECUBITAL  Final   Special Requests   Final    BOTTLES DRAWN AEROBIC AND ANAEROBIC Blood Culture adequate volume   Culture   Final    NO GROWTH < 12 HOURS Performed at [facility], [address] [place], [place] [address]    Report Status PENDING  Incomplete  Culture, blood (routine x 2)     Status: None (Preliminary result)   Collection Time: 09/22/19  1:08 AM   Specimen: BLOOD  Result Value Ref Range Status   Specimen Description BLOOD LEFT ANTECUBITAL  Final   Special Requests   Final    BOTTLES DRAWN AEROBIC AND ANAEROBIC Blood Culture adequate volume   Culture   Final    NO GROWTH < 12 HOURS Performed at [facility], [address], [place], [place] [address]    Report Status PENDING  Incomplete     Time coordinating discharge: Over 30 minutes  SIGNED:   Ezekiel Slocumb, DO [facility] 09/22/2019, 1:41 PM   If 7PM-7AM, please  contact night-coverage [email or web address]

## 2019-09-22 NOTE — Progress Notes (Signed)
PHARMACIST - PHYSICIAN COMMUNICATION  CONCERNING:  Enoxaparin (Lovenox) for DVT Prophylaxis    RECOMMENDATION: Patient was prescribed enoxaprin 30mg  q24 hours for VTE prophylaxis.   Filed Weights   09/21/19 2346  Weight: 129 lb (58.5 kg)    Body mass index is 25.19 kg/m.  Estimated Creatinine Clearance: 50.1 mL/min (by C-G formula based on SCr of 0.69 mg/dL).   Based on [place] patient is candidate for enoxaparin 40mg  every 24 hour based on Wt >45 kg and CrCl >30 ml/min  DESCRIPTION: Pharmacy has adjusted enoxaparin dose per [facility] policy.  Patient is now receiving enoxaparin 40mg  every 24 hours.   Pernell Dupre, PharmD, BCPS Clinical Pharmacist 09/22/2019 12:16 PM

## 2019-09-22 NOTE — ED Notes (Signed)
Called and cancelled admit per RN [place] as patient is being d/c to home

## 2019-09-24 ENCOUNTER — Telehealth: Payer: Self-pay | Admitting: *Deleted

## 2019-09-24 NOTE — Telephone Encounter (Signed)
LVM for pt to call office to schedule hospital follow up appointment.

## 2019-09-27 ENCOUNTER — Encounter: Payer: Self-pay | Admitting: *Deleted

## 2019-09-27 LAB — CULTURE, BLOOD (ROUTINE X 2)
Culture: NO GROWTH
Culture: NO GROWTH
Special Requests: ADEQUATE
Special Requests: ADEQUATE

## 2019-09-27 NOTE — Telephone Encounter (Signed)
I have made two attempts and have been unable to reach patient. I have mailed the patient a letter requesting they call the office to schedule a hospital follow up appointment.   

## 2019-10-05 ENCOUNTER — Telehealth: Payer: Self-pay

## 2019-10-05 NOTE — Telephone Encounter (Signed)
Patient's husband Rodney Dangler (who is a patient of Dr. Synthia Innocent)  called and asked if Dr. Darnell Level would accept his wife as a new patient.  Patient was seen in the ED for chest pain on 09/21/19 and is needing a Hosp F/U, but she is also needing so est care with a new PCP, as her current PCP, Dr. Deborra Medina, is leaving [place].  Dr. Darnell Level, would you be willing to accept patient and do a NP and Hosp F/U visit?

## 2019-10-05 NOTE — Telephone Encounter (Signed)
Ok to schedule with me.  plz place in open 30 min slot.

## 2019-10-06 NOTE — Telephone Encounter (Signed)
2/10 appointment spouse aware

## 2019-10-13 ENCOUNTER — Other Ambulatory Visit: Payer: Self-pay

## 2019-10-13 ENCOUNTER — Encounter: Payer: Self-pay | Admitting: Family Medicine

## 2019-10-13 ENCOUNTER — Ambulatory Visit (INDEPENDENT_AMBULATORY_CARE_PROVIDER_SITE_OTHER): Payer: Medicare Other | Admitting: Family Medicine

## 2019-10-13 VITALS — BP 140/86 | HR 68 | Temp 97.9°F | Ht 60.5 in | Wt 131.2 lb

## 2019-10-13 DIAGNOSIS — R519 Headache, unspecified: Secondary | ICD-10-CM

## 2019-10-13 DIAGNOSIS — F064 Anxiety disorder due to known physiological condition: Secondary | ICD-10-CM | POA: Diagnosis not present

## 2019-10-13 DIAGNOSIS — B0229 Other postherpetic nervous system involvement: Secondary | ICD-10-CM

## 2019-10-13 DIAGNOSIS — E876 Hypokalemia: Secondary | ICD-10-CM

## 2019-10-13 DIAGNOSIS — I10 Essential (primary) hypertension: Secondary | ICD-10-CM

## 2019-10-13 DIAGNOSIS — F5104 Psychophysiologic insomnia: Secondary | ICD-10-CM | POA: Diagnosis not present

## 2019-10-13 DIAGNOSIS — I200 Unstable angina: Secondary | ICD-10-CM | POA: Diagnosis not present

## 2019-10-13 DIAGNOSIS — F410 Panic disorder [episodic paroxysmal anxiety] without agoraphobia: Secondary | ICD-10-CM | POA: Diagnosis not present

## 2019-10-13 DIAGNOSIS — E042 Nontoxic multinodular goiter: Secondary | ICD-10-CM | POA: Diagnosis not present

## 2019-10-13 MED ORDER — TRAZODONE HCL 50 MG PO TABS: 25.0000 mg | ORAL_TABLET | Freq: Every evening | ORAL | 1 refills | Status: DC | PRN

## 2019-10-13 MED ORDER — PROPRANOLOL HCL ER 80 MG PO CP24: 80.0000 mg | ORAL_CAPSULE | Freq: Every day | ORAL | 1 refills | Status: DC

## 2019-10-13 MED ORDER — POTASSIUM CHLORIDE CRYS ER 20 MEQ PO TBCR: 20.0000 meq | EXTENDED_RELEASE_TABLET | Freq: Two times a day (BID) | ORAL | 1 refills | Status: DC

## 2019-10-13 MED ORDER — ALPRAZOLAM 0.25 MG PO TABS: 0.2500 mg | ORAL_TABLET | Freq: Two times a day (BID) | ORAL | 0 refills | Status: DC | PRN

## 2019-10-13 MED ORDER — LOSARTAN POTASSIUM 50 MG PO TABS: 50.0000 mg | ORAL_TABLET | Freq: Two times a day (BID) | ORAL | 3 refills | Status: DC

## 2019-10-13 MED ORDER — PREGABALIN 25 MG PO CAPS: 25.0000 mg | ORAL_CAPSULE | Freq: Two times a day (BID) | ORAL | 3 refills | Status: DC

## 2019-10-13 NOTE — Progress Notes (Signed)
This visit was conducted in person.  BP 140/86 (BP Location: Right Arm, Cuff Size: Normal)   Pulse 68   Temp 97.9 F (36.6 C) (Temporal)   Ht 5' 0.5" (1.537 m)   Wt 131 lb 3 oz (59.5 kg)   SpO2 98%   BMI 25.20 kg/m    CC: transfer of care Subjective:    Patient ID: Robin Peck, female    DOB: March 13, 1946, 74 y.o.   MRN: [id number]  HPI: Robin Peck is a 74 y.o. female presenting on 10/13/2019 for Transitions Of Care (TOC from Dr. Deborra Medina.)   I see patient's husband. Transfer of care from Dr Deborra Medina.  Last saw PCP 02/2019 for video visit, note reviewed.  Seen at ER last month with chest tightness, palpitations, pounding headache and dyspnea. Ruled out for ACS with negative troponins. Propranolol LA 80mg  daily was started. She was also treated with meloxicam 7.5mg  - she stopped this due to worsening GI upset, nausea with NSAID. She was also provided Rx for buspar 7.5mg  bid, fioricet PRN and sumatriptan PRN. buspar may have caused R sided facial pain. Did not continue any of these.   Ongoing pounding of head and "walking zombie" mental fogginess feeling. Continuous pounding in head started 2019 on trip to [place] - at same time as she started generic pregabalin and amlodipine (in place of brand Lyrica due to insurance coverage change). She did have CT angio of head and neck for pounding feeling:  CTA ANGIO HEAD AND NECK IMPRESSION (09/28/2018): 1. Atherosclerotic calcifications within the cavernous internal carotid arteries bilaterally without the significant stenosis or change. 2. No significant proximal stenosis, aneurysm, or branch vessel occlusion. 3. Normal CT appearance of the brain without and with contrast. 4. Multinodular goiter without a dominant lesion. 5. No acute or focal lesion in the right neck to explain her pain otherwise.  HTN - Compliant with current antihypertensive regimen of cozaar 50mg  bid, hctz 25mg  daily, and propranolol LA 80mg  daily. Felt amlodipine was  causing insomnia. Does check blood pressures twice daily at home and keeps a good log: overall well controlled 110-130/60s.  No low blood pressure readings or symptoms of dizziness/syncope.  Denies HA, vision changes, CP/tightness, SOB, leg swelling.    She is on pregabalin for post herpetic neuralgia to L chest wall to axilla and back (persistent numbness, tingling, burning) 2018. Gabapentin may have caused rash.   She has developed anxiety since she developed post herpetic neuralgia - managed with xanax PRN. She continues noticing trouble with insomnia. She tried melatonin - caused headache in the morning.      Relevant past medical, surgical, family and social history reviewed and updated as indicated. Interim medical history since our last visit reviewed. Allergies and medications reviewed and updated. Outpatient Medications Prior to Visit  Medication Sig Dispense Refill  . aspirin EC 81 MG tablet Take 81 mg by mouth daily.    Marland Kitchen atorvastatin (LIPITOR) 10 MG tablet TAKE ONE TABLET BY MOUTH ONE TIME DAILY  90 tablet 0  . cetirizine (ZYRTEC) 10 MG tablet TAKE 1 TABLET (10 MG TOTAL) BY MOUTH DAILY. (Patient taking differently: Take 10 mg by mouth at bedtime. ) 90 tablet 0  . hydrochlorothiazide (HYDRODIURIL) 25 MG tablet TAKE 1 TABLET BY MOUTH EVERY DAY 90 tablet 2  . ALPRAZolam (XANAX) 0.25 MG tablet Take 1 tablet (0.25 mg total) by mouth 2 (two) times daily as needed for anxiety or sleep. 45 tablet 0  . losartan (COZAAR) 50  MG tablet TAKE ONE TABLET BY MOUTH TWICE DAILY  180 tablet 0  . potassium chloride SA (KLOR-CON M20) 20 MEQ tablet Take 1 tablet (20 mEq total) by mouth 2 (two) times daily. 180 tablet 0  . pregabalin (LYRICA) 50 MG capsule Take 1 capsule (50 mg total) by mouth 2 (two) times daily. 180 capsule 1  . propranolol ER (INDERAL LA) 80 MG 24 hr capsule Take 1 capsule (80 mg total) by mouth daily. 30 capsule 1  . busPIRone (BUSPAR) 7.5 MG tablet Take 1 tablet (7.5 mg total) by mouth  2 (two) times daily. 60 tablet 0  . cyclobenzaprine (FLEXERIL) 5 MG tablet TAKE ONE TABLET BY MOUTH DAILY AT BEDTIME  (Patient taking differently: Take 5 mg by mouth at bedtime as needed for muscle spasms. ) 30 tablet 1  . meloxicam (MOBIC) 7.5 MG tablet Take 1 tablet (7.5 mg total) by mouth daily. 30 tablet 1  . SUMAtriptan (IMITREX) 50 MG tablet Take 1 tablet (50 mg total) by mouth once as needed for migraine. May repeat in 2 hours if headache persists or recurs. 30 tablet 0   No facility-administered medications prior to visit.     Per HPI unless specifically indicated in ROS section below Review of Systems Objective:    BP 140/86 (BP Location: Right Arm, Cuff Size: Normal)   Pulse 68   Temp 97.9 F (36.6 C) (Temporal)   Ht 5' 0.5" (1.537 m)   Wt 131 lb 3 oz (59.5 kg)   SpO2 98%   BMI 25.20 kg/m   Wt Readings from Last 3 Encounters:  10/13/19 131 lb 3 oz (59.5 kg)  09/21/19 129 lb (58.5 kg)  02/08/19 130 lb (59 kg)    Physical Exam Vitals and nursing note reviewed.  Constitutional:      Appearance: Normal appearance. She is not ill-appearing.  HENT:     Right Ear: Tympanic membrane, ear canal and external ear normal. There is no impacted cerumen.     Left Ear: Tympanic membrane, ear canal and external ear normal. There is no impacted cerumen.  Eyes:     Extraocular Movements: Extraocular movements intact.     Pupils: Pupils are equal, round, and reactive to light.  Cardiovascular:     Rate and Rhythm: Normal rate and regular rhythm.     Pulses: Normal pulses.     Heart sounds: Murmur (2/6 systolic) present.  Pulmonary:     Effort: Pulmonary effort is normal. No respiratory distress.     Breath sounds: Normal breath sounds. No wheezing, rhonchi or rales.  Musculoskeletal:     Right lower leg: No edema.     Left lower leg: No edema.  Neurological:     Mental Status: She is alert.  Psychiatric:        Mood and Affect: Mood normal.        Behavior: Behavior normal.        Results for orders placed or performed during the hospital encounter of 09/21/19  Respiratory Panel by RT PCR (Flu A&B, Covid) - Nasopharyngeal Swab   Specimen: Nasopharyngeal Swab  Result Value Ref Range   SARS Coronavirus 2 by RT PCR NEGATIVE NEGATIVE   Influenza A by PCR NEGATIVE NEGATIVE   Influenza B by PCR NEGATIVE NEGATIVE  Culture, blood (routine x 2)   Specimen: BLOOD  Result Value Ref Range   Specimen Description BLOOD RIGHT ANTECUBITAL    Special Requests      BOTTLES DRAWN AEROBIC AND  ANAEROBIC Blood Culture adequate volume   Culture      NO GROWTH 5 DAYS Performed at [facility], [address] [place], [place] [address]    Report Status 09/27/2019 FINAL   Culture, blood (routine x 2)   Specimen: BLOOD  Result Value Ref Range   Specimen Description BLOOD LEFT ANTECUBITAL    Special Requests      BOTTLES DRAWN AEROBIC AND ANAEROBIC Blood Culture adequate volume   Culture      NO GROWTH 5 DAYS Performed at [facility], [address] [place], [place] [address]    Report Status 09/27/2019 FINAL   CBC  Result Value Ref Range   WBC 6.4 4.0 - 10.5 K/uL   RBC 4.32 3.87 - 5.11 MIL/uL   Hemoglobin 12.3 12.0 - 15.0 g/dL   HCT 36.8 36.0 - 46.0 %   MCV 85.2 80.0 - 100.0 fL   MCH 28.5 26.0 - 34.0 pg   MCHC 33.4 30.0 - 36.0 g/dL   RDW 11.8 11.5 - 15.5 %   Platelets 336 150 - 400 K/uL   nRBC 0.0 0.0 - 0.2 %  Comprehensive metabolic panel  Result Value Ref Range   Sodium 134 (L) 135 - 145 mmol/L   Potassium 3.5 3.5 - 5.1 mmol/L   Chloride 98 98 - 111 mmol/L   CO2 26 22 - 32 mmol/L   Glucose, Bld 115 (H) 70 - 99 mg/dL   BUN 20 8 - 23 mg/dL   Creatinine, Ser 0.69 0.44 - 1.00 mg/dL   Calcium 9.4 8.9 - 10.3 mg/dL   Total Protein 7.5 6.5 - 8.1 g/dL   Albumin 3.9 3.5 - 5.0 g/dL   AST 17 15 - 41 U/L   ALT 13 0 - 44 U/L   Alkaline Phosphatase 90 38 - 126 U/L   Total Bilirubin 0.5 0.3 - 1.2 mg/dL   GFR calc non Af Amer >60 >60 mL/min    GFR calc Af Amer >60 >60 mL/min   Anion gap 10 5 - 15  Lipase, blood  Result Value Ref Range   Lipase 46 11 - 51 U/L  Lactic acid, plasma  Result Value Ref Range   Lactic Acid, Venous 1.0 0.5 - 1.9 mmol/L  Lipid panel  Result Value Ref Range   Cholesterol 186 0 - 200 mg/dL   Triglycerides 162 (H) <150 mg/dL   HDL 41 >40 mg/dL   Total CHOL/HDL Ratio 4.5 RATIO   VLDL 32 0 - 40 mg/dL   LDL Cholesterol 113 (H) 0 - 99 mg/dL  Troponin I (High Sensitivity)  Result Value Ref Range   Troponin I (High Sensitivity) 4 <18 ng/L  Troponin I (High Sensitivity)  Result Value Ref Range   Troponin I (High Sensitivity) 4 <18 ng/L  Troponin I (High Sensitivity)  Result Value Ref Range   Troponin I (High Sensitivity) 5 <18 ng/L  Troponin I (High Sensitivity)  Result Value Ref Range   Troponin I (High Sensitivity) 6 <18 ng/L   Assessment & Plan:  This visit occurred during the SARS-CoV-2 public health emergency.  Safety protocols were in place, including screening questions prior to the visit, additional usage of staff PPE, and extensive cleaning of exam room while observing appropriate contact time as indicated for disinfecting solutions.   Problem List Items Addressed This Visit    Pounding in head    Doesn't describe true headache but rather pounding feeling in head - which may have started after  brand Lyrica was changed to generic pregabalin due to poor insurance coverage. I would like to try taper off pregabalin (possible improved PHN, 2 yrs later) and if PHN pain returning, would consider return to [facility] Lyrica. For now will decrease pregabalin to 25mg  bid. Pt agrees with plan.  RTC 1 mo for close f/u as we try pregabalin taper.      Relevant Medications   pregabalin (LYRICA) 25 MG capsule   traZODone (DESYREL) 50 MG tablet   propranolol ER (INDERAL LA) 80 MG 24 hr capsule   PHN (postherpetic neuralgia)    Chronic PHN after episode of shingles 2018 - on Lyrica ever since (see below).  I  did suggest she get her shingrix series - she will check with pharmacy.  Previous gabapentin trial may have caused rash.       Multiple thyroid nodules    None suspicious by [place] 2018      Relevant Medications   propranolol ER (INDERAL LA) 80 MG 24 hr capsule   RESOLVED: Hypokalemia   Relevant Medications   potassium chloride SA (KLOR-CON M20) 20 MEQ tablet   Essential hypertension    Chronic, stable. Actually improved control with addition of propranolol - continue this.       Relevant Medications   losartan (COZAAR) 50 MG tablet   propranolol ER (INDERAL LA) 80 MG 24 hr capsule   Chronic insomnia    Poor sleep is contributing to anxiety.  Sleep hygiene measures reviewed, checklist provided. Trial trazodone 25-50mg  nightly.  Consider TCA if ongoing PHN after pregabalin taper      Anxiety disorder due to general medical condition with panic attack - Primary    Acute worsening anxiety after shingles with prolonged post herpetic neuralgia - she avoids excursions without her husband due to increased anxiety when traveling alone. She has managed this with PRN xanax. Also has associated insomnia.  Recent ER visit sounds like it may have been related to another anxiety attack. She feels propranolol has helped - and continues this.       Relevant Medications   ALPRAZolam (XANAX) 0.25 MG tablet   traZODone (DESYREL) 50 MG tablet       Meds ordered this encounter  Medications  . ALPRAZolam (XANAX) 0.25 MG tablet    Sig: Take 1 tablet (0.25 mg total) by mouth 2 (two) times daily as needed for anxiety or sleep.    Dispense:  30 tablet    Refill:  0  . losartan (COZAAR) 50 MG tablet    Sig: Take 1 tablet (50 mg total) by mouth 2 (two) times daily.    Dispense:  180 tablet    Refill:  3  . potassium chloride SA (KLOR-CON M20) 20 MEQ tablet    Sig: Take 1 tablet (20 mEq total) by mouth 2 (two) times daily.    Dispense:  180 tablet    Refill:  1  . pregabalin (LYRICA) 25 MG capsule     Sig: Take 1 capsule (25 mg total) by mouth 2 (two) times daily.    Dispense:  60 capsule    Refill:  3    Note new dose  . traZODone (DESYREL) 50 MG tablet    Sig: Take 0.5-1 tablets (25-50 mg total) by mouth at bedtime as needed for sleep.    Dispense:  30 tablet    Refill:  1  . propranolol ER (INDERAL LA) 80 MG 24 hr capsule    Sig: Take 1 capsule (80  mg total) by mouth daily.    Dispense:  90 capsule    Refill:  1   No orders of the defined types were placed in this encounter.   Patient Instructions  If interested, check with pharmacy about new 2 shot shingles series (shingrix).  I am suspicious for pregabalin causing pounding. Decrease pregabalin down to 25mg  twice daily in hopes of being able to taper off fully. Xanax refilled today.  Trial trazodone 1/2 tablet for sleep. Look below for bedtime routine recommendations.  Nice to meet you today!  Return in 2-3 months for medicare wellness visit.   Sleep hygiene checklist: 1. Avoid naps during the day 2. Avoid stimulants such as caffeine and nicotine. Avoid bedtime alcohol (it can speed onset of sleep but the body's metabolism can cause awakenings). 3. All forms of exercise help ensure sound sleep - limit vigorous exercise to morning or late afternoon 4. Avoid food too close to bedtime including chocolate (which contains caffeine) 5. Soak up natural light 6. Establish regular bedtime routine. 7. Associate bed with sleep - avoid TV, computer or phone, reading while in bed. 8. Ensure pleasant, relaxing sleep environment - quiet, dark, cool room.    Follow up plan: Return in about 4 weeks (around 11/10/2019), or if symptoms worsen or fail to improve, for follow up visit, annual exam, prior fasting for blood work.  [name], MD

## 2019-10-13 NOTE — Patient Instructions (Addendum)
If interested, check with pharmacy about new 2 shot shingles series (shingrix).  I am suspicious for pregabalin causing pounding. Decrease pregabalin down to 25mg  twice daily in hopes of being able to taper off fully. Xanax refilled today.  Trial trazodone 1/2 tablet for sleep. Look below for bedtime routine recommendations.  Nice to meet you today!  Return in 2-3 months for medicare wellness visit.   Sleep hygiene checklist: 1. Avoid naps during the day 2. Avoid stimulants such as caffeine and nicotine. Avoid bedtime alcohol (it can speed onset of sleep but the body's metabolism can cause awakenings). 3. All forms of exercise help ensure sound sleep - limit vigorous exercise to morning or late afternoon 4. Avoid food too close to bedtime including chocolate (which contains caffeine) 5. Soak up natural light 6. Establish regular bedtime routine. 7. Associate bed with sleep - avoid TV, computer or phone, reading while in bed. 8. Ensure pleasant, relaxing sleep environment - quiet, dark, cool room.

## 2019-10-16 DIAGNOSIS — F5104 Psychophysiologic insomnia: Secondary | ICD-10-CM | POA: Insufficient documentation

## 2019-10-16 DIAGNOSIS — R519 Headache, unspecified: Secondary | ICD-10-CM | POA: Insufficient documentation

## 2019-10-16 NOTE — Assessment & Plan Note (Addendum)
Doesn't describe true headache but rather pounding feeling in head - which may have started after brand Lyrica was changed to generic pregabalin due to poor insurance coverage. I would like to try taper off pregabalin (possible improved PHN, 2 yrs later) and if PHN pain returning, would consider return to [facility] Lyrica. For now will decrease pregabalin to 25mg  bid. Pt agrees with plan.  RTC 1 mo for close f/u as we try pregabalin taper.

## 2019-10-16 NOTE — Assessment & Plan Note (Addendum)
None suspicious by [place] 2018

## 2019-10-16 NOTE — Assessment & Plan Note (Signed)
Poor sleep is contributing to anxiety.  Sleep hygiene measures reviewed, checklist provided. Trial trazodone 25-50mg  nightly.  Consider TCA if ongoing PHN after pregabalin taper

## 2019-10-16 NOTE — Assessment & Plan Note (Addendum)
Chronic PHN after episode of shingles 2018 - on Lyrica ever since (see below).  I did suggest she get her shingrix series - she will check with pharmacy.  Previous gabapentin trial may have caused rash.

## 2019-10-16 NOTE — Assessment & Plan Note (Signed)
Chronic, stable. Actually improved control with addition of propranolol - continue this.

## 2019-10-16 NOTE — Assessment & Plan Note (Signed)
Acute worsening anxiety after shingles with prolonged post herpetic neuralgia - she avoids excursions without her husband due to increased anxiety when traveling alone. She has managed this with PRN xanax. Also has associated insomnia.  Recent ER visit sounds like it may have been related to another anxiety attack. She feels propranolol has helped - and continues this.

## 2019-11-30 ENCOUNTER — Encounter: Payer: Self-pay | Admitting: Family Medicine

## 2019-11-30 DIAGNOSIS — E7800 Pure hypercholesterolemia, unspecified: Secondary | ICD-10-CM

## 2019-11-30 MED ORDER — ATORVASTATIN CALCIUM 10 MG PO TABS: 10.0000 mg | ORAL_TABLET | Freq: Every day | ORAL | 3 refills | Status: DC

## 2019-12-08 ENCOUNTER — Other Ambulatory Visit: Payer: Self-pay

## 2019-12-14 ENCOUNTER — Other Ambulatory Visit: Payer: Self-pay | Admitting: Family Medicine

## 2019-12-14 DIAGNOSIS — E7800 Pure hypercholesterolemia, unspecified: Secondary | ICD-10-CM

## 2019-12-14 DIAGNOSIS — E042 Nontoxic multinodular goiter: Secondary | ICD-10-CM

## 2019-12-14 DIAGNOSIS — R739 Hyperglycemia, unspecified: Secondary | ICD-10-CM

## 2019-12-15 ENCOUNTER — Other Ambulatory Visit (INDEPENDENT_AMBULATORY_CARE_PROVIDER_SITE_OTHER): Payer: Medicare Other

## 2019-12-15 DIAGNOSIS — E7800 Pure hypercholesterolemia, unspecified: Secondary | ICD-10-CM

## 2019-12-15 DIAGNOSIS — E042 Nontoxic multinodular goiter: Secondary | ICD-10-CM | POA: Diagnosis not present

## 2019-12-15 DIAGNOSIS — R739 Hyperglycemia, unspecified: Secondary | ICD-10-CM | POA: Diagnosis not present

## 2019-12-15 LAB — BASIC METABOLIC PANEL
BUN: 16 mg/dL (ref 6–23)
CO2: 30 mEq/L (ref 19–32)
Calcium: 9.8 mg/dL (ref 8.4–10.5)
Chloride: 100 mEq/L (ref 96–112)
Creatinine, Ser: 0.84 mg/dL (ref 0.40–1.20)
GFR: 66.3 mL/min (ref >60.00)
Glucose, Bld: 108 mg/dL — ABNORMAL HIGH (ref 70–99)
Potassium: 4 mEq/L (ref 3.5–5.1)
Sodium: 134 mEq/L — ABNORMAL LOW (ref 135–145)

## 2019-12-15 LAB — HEMOGLOBIN A1C: Hgb A1c MFr Bld: 6.1 % (ref 4.6–6.5)

## 2019-12-15 LAB — TSH: TSH: 3.52 u[IU]/mL (ref 0.35–4.50)

## 2019-12-20 ENCOUNTER — Other Ambulatory Visit: Payer: Self-pay

## 2019-12-20 ENCOUNTER — Telehealth: Payer: Self-pay | Admitting: Family Medicine

## 2019-12-20 ENCOUNTER — Ambulatory Visit (INDEPENDENT_AMBULATORY_CARE_PROVIDER_SITE_OTHER): Payer: Medicare Other | Admitting: Family Medicine

## 2019-12-20 ENCOUNTER — Encounter: Payer: Self-pay | Admitting: Family Medicine

## 2019-12-20 VITALS — BP 140/90 | HR 63 | Temp 97.0°F | Ht 60.5 in | Wt 129.3 lb

## 2019-12-20 DIAGNOSIS — F410 Panic disorder [episodic paroxysmal anxiety] without agoraphobia: Secondary | ICD-10-CM

## 2019-12-20 DIAGNOSIS — B0229 Other postherpetic nervous system involvement: Secondary | ICD-10-CM | POA: Diagnosis not present

## 2019-12-20 DIAGNOSIS — F064 Anxiety disorder due to known physiological condition: Secondary | ICD-10-CM

## 2019-12-20 DIAGNOSIS — Z0001 Encounter for general adult medical examination with abnormal findings: Secondary | ICD-10-CM

## 2019-12-20 DIAGNOSIS — I200 Unstable angina: Secondary | ICD-10-CM

## 2019-12-20 DIAGNOSIS — R519 Headache, unspecified: Secondary | ICD-10-CM | POA: Diagnosis not present

## 2019-12-20 DIAGNOSIS — Z7189 Other specified counseling: Secondary | ICD-10-CM | POA: Diagnosis not present

## 2019-12-20 DIAGNOSIS — F5104 Psychophysiologic insomnia: Secondary | ICD-10-CM | POA: Diagnosis not present

## 2019-12-20 DIAGNOSIS — E7800 Pure hypercholesterolemia, unspecified: Secondary | ICD-10-CM

## 2019-12-20 DIAGNOSIS — M8580 Other specified disorders of bone density and structure, unspecified site: Secondary | ICD-10-CM | POA: Insufficient documentation

## 2019-12-20 DIAGNOSIS — M810 Age-related osteoporosis without current pathological fracture: Secondary | ICD-10-CM | POA: Insufficient documentation

## 2019-12-20 DIAGNOSIS — I10 Essential (primary) hypertension: Secondary | ICD-10-CM | POA: Diagnosis not present

## 2019-12-20 DIAGNOSIS — H547 Unspecified visual loss: Secondary | ICD-10-CM

## 2019-12-20 DIAGNOSIS — M8588 Other specified disorders of bone density and structure, other site: Secondary | ICD-10-CM

## 2019-12-20 DIAGNOSIS — Z0000 Encounter for general adult medical examination without abnormal findings: Secondary | ICD-10-CM | POA: Insufficient documentation

## 2019-12-20 MED ORDER — B COMPLEX VITAMINS PO CAPS: 1.0000 | ORAL_CAPSULE | Freq: Every day | ORAL | Status: DC

## 2019-12-20 MED ORDER — PREGABALIN 50 MG PO CAPS: 50.0000 mg | ORAL_CAPSULE | Freq: Every day | ORAL | 3 refills | Status: DC

## 2019-12-20 MED ORDER — PREGABALIN 50 MG PO CAPS: 50.0000 mg | ORAL_CAPSULE | Freq: Every day | ORAL | 1 refills | Status: DC

## 2019-12-20 NOTE — Telephone Encounter (Signed)
Sent in

## 2019-12-20 NOTE — Assessment & Plan Note (Signed)
Chronic, overall stable with some fluctuations. Continue losartan 50mg  bid with hctz 25mg  daily.

## 2019-12-20 NOTE — Assessment & Plan Note (Signed)

## 2019-12-20 NOTE — Assessment & Plan Note (Signed)
Update DEXA as overdue.

## 2019-12-20 NOTE — Assessment & Plan Note (Signed)
Fails vision screen today - and noted cataracts. I have asked her to call and schedule eye exam.

## 2019-12-20 NOTE — Assessment & Plan Note (Signed)
Notes recurrent PHN burning pain with too fast taper off pregabalin - will continue at lower 50mg  once daily dose. Discussed Lyrica options - see below (HA section)

## 2019-12-20 NOTE — Assessment & Plan Note (Addendum)
Chronic, above goal on atorvastatin 10mg  - continue for now.  The 10-year ASCVD risk score Robin Peck DC Brooke Bonito., et al., 2013) is: 20.3%   Values used to calculate the score:     Age: 74 years     Sex: Female     Is Non-Hispanic African American: No     Diabetic: No     Tobacco smoker: No     Systolic Blood Pressure: [id number] mmHg     Is BP treated: Yes     HDL Cholesterol: 41 mg/dL     Total Cholesterol: 186 mg/dL

## 2019-12-20 NOTE — Assessment & Plan Note (Addendum)
Overall improvement noted with slow taper off pregabalin - continue to monitor. Continues PRN xanax.

## 2019-12-20 NOTE — Assessment & Plan Note (Signed)
This has significantly improved with lower pregabalin dose suggesting it was a side effect of medication. Discussed options of trying Lyrica brand name again vs continuing current lower pregabalin dose with goal to fully taper off. She would like to try "authorized generic" program through [employer] where she may receive generic Lyrica for more affordable price - accordingly I sent in refill of lyrica 50mg  once daily to [facility].

## 2019-12-20 NOTE — Patient Instructions (Addendum)
We will sign you up for [facility]olog[facility].  Schedule eye exam at your convenience.  I have sent in Lyrica to [facility] to price out.  Call to schedule mammogram at your convenience: [facility] at [facility] [phone] We will sign you up for bone density scan - hopefully on the same day as the mammogram.  Bring [place] a copy of your living will to update your chart.  You are doing well today. Continue pregabalin 50mg  once daily for now.  Return as needed or in 6 months for follow up visit.   Health Maintenance After Age 29 After age 80, you are at a higher risk for certain long-term diseases and infections as well as injuries from falls. Falls are a major cause of broken bones and head injuries in people who are older than age 53. Getting regular preventive care can help to keep you healthy and well. Preventive care includes getting regular testing and making lifestyle changes as recommended by your health care provider. Talk with your health care provider about:  Which screenings and tests you should have. A screening is a test that checks for a disease when you have no symptoms.  A diet and exercise plan that is right for you. What should I know about screenings and tests to prevent falls? Screening and testing are the best ways to find a health problem early. Early diagnosis and treatment give you the best chance of managing medical conditions that are common after age 4. Certain conditions and lifestyle choices may make you more likely to have a fall. Your health care provider may recommend:  Regular vision checks. Poor vision and conditions such as cataracts can make you more likely to have a fall. If you wear glasses, make sure to get your prescription updated if your vision changes.  Medicine review. Work with your health care provider to regularly review all of the medicines you are taking, including over-the-counter medicines. Ask your health care provider about any side effects that  may make you more likely to have a fall. Tell your health care provider if any medicines that you take make you feel dizzy or sleepy.  Osteoporosis screening. Osteoporosis is a condition that causes the bones to get weaker. This can make the bones weak and cause them to break more easily.  Blood pressure screening. Blood pressure changes and medicines to control blood pressure can make you feel dizzy.  Strength and balance checks. Your health care provider may recommend certain tests to check your strength and balance while standing, walking, or changing positions.  Foot health exam. Foot pain and numbness, as well as not wearing proper footwear, can make you more likely to have a fall.  Depression screening. You may be more likely to have a fall if you have a fear of falling, feel emotionally low, or feel unable to do activities that you used to do.  Alcohol use screening. Using too much alcohol can affect your balance and may make you more likely to have a fall. What actions can I take to lower my risk of falls? General instructions  Talk with your health care provider about your risks for falling. Tell your health care provider if: ? You fall. Be sure to tell your health care provider about all falls, even ones that seem minor. ? You feel dizzy, sleepy, or off-balance.  Take over-the-counter and prescription medicines only as told by your health care provider. These include any supplements.  Eat a healthy diet  and maintain a healthy weight. A healthy diet includes low-fat dairy products, low-fat (lean) meats, and fiber from whole grains, beans, and lots of fruits and vegetables. Home safety  Remove any tripping hazards, such as rugs, cords, and clutter.  Install safety equipment such as grab bars in bathrooms and safety rails on stairs.  Keep rooms and walkways well-lit. Activity   Follow a regular exercise program to stay fit. This will help you maintain your balance. Ask your  health care provider what types of exercise are appropriate for you.  If you need a cane or walker, use it as recommended by your health care provider.  Wear supportive shoes that have nonskid soles. Lifestyle  Do not drink alcohol if your health care provider tells you not to drink.  If you drink alcohol, limit how much you have: ? 0-1 drink a day for women. ? 0-2 drinks a day for men.  Be aware of how much alcohol is in your drink. In the [place] one drink equals one typical bottle of beer (12 oz), one-half glass of wine (5 oz), or one shot of hard liquor (1 oz).  Do not use any products that contain nicotine or tobacco, such as cigarettes and e-cigarettes. If you need help quitting, ask your health care provider. Summary  Having a healthy lifestyle and getting preventive care can help to protect your health and wellness after age 30.  Screening and testing are the best way to find a health problem early and help you avoid having a fall. Early diagnosis and treatment give you the best chance for managing medical conditions that are more common for people who are older than age 43.  Falls are a major cause of broken bones and head injuries in people who are older than age 60. Take precautions to prevent a fall at home.  Work with your health care provider to learn what changes you can make to improve your health and wellness and to prevent falls. This information is not intended to replace advice given to you by your health care provider. Make sure you discuss any questions you have with your health care provider. Document Revised: 12/10/2018 Document Reviewed: 07/02/2017 Elsevier Patient Education  2020 [employer].

## 2019-12-20 NOTE — Telephone Encounter (Signed)
Pt is at the pharmacy to pick up Rx - Lyrica  Requesting a 90-day supply for cost reasons.   Please advise, thanks.

## 2019-12-20 NOTE — Assessment & Plan Note (Signed)
Overall improved with lower pregabalin dose - has not needed trazodone but would like to keep to use if needed.

## 2019-12-20 NOTE — Progress Notes (Signed)
This visit was conducted in person.  BP 140/90 (BP Location: Right Arm, Cuff Size: Normal)   Pulse 63   Temp (!) 97 F (36.1 C) (Temporal)   Ht 5' 0.5" (1.537 m)   Wt 129 lb 5 oz (58.7 kg)   SpO2 98%   BMI 24.84 kg/m    BP Readings from Last 3 Encounters:  12/20/19 140/90  10/13/19 140/86  09/22/19 135/72   BP 140/68 at home this morning CC: AMW Subjective:    Patient ID: Robin Peck, female    DOB: May 23, 1946, 74 y.o.   MRN: [id number]  HPI: Robin Peck is a 74 y.o. female presenting on 12/20/2019 for Annual Exam   Did not see health advisor. No recent CPE.  See prior note for details- transferred care from Dr Deborra Medina. Last visit I suggested trying to taper off pregabalin (started for PHN 2 yrs ago) - concern this was causing pounding headache. We also started trazodone for sleep. She found she tolerates pregabalin 50mg  in am better with mild pounding at night time. She also notes anxiety is better on lower pregabalin. When she tried further taper - L chest PHN burning pain did recur. Likely generic pregabalin causing pounding headache and anxiety - always did better with brand Lyrica. She is using trazodone only PRN. She is interested in trying Authorized Generic Lyrica (pregabalin) through [employer].   HTN - BP elevated this morning despite losartan 50mg  bid and hctz 25mg  daily and propranolol ER 80mg  daily.    Hearing Screening   125Hz  250Hz  500Hz  1000Hz  2000Hz  3000Hz  4000Hz  6000Hz  8000Hz   Right ear:           Left ear:             Visual Acuity Screening   Right eye Left eye Both eyes  Without correction:     With correction: 20/50 20/50 20/50       Office Visit from 12/20/2019 in [facility] [place]  PHQ-2 Total Score  0      Fall Risk  12/20/2019 07/29/2018 06/23/2017  Falls in the past year? 0 0 No      Preventative: Colon cancer screening - discussed options - desires to try cologard Lung cancer screening - not eligible Breast cancer  screening - no recent mammogram. No concerns with home breast exams.  Well woman exam - >10 yrs ago - encouraged she schedule, # provided DEXA scan - osteopenia 2004 - will schedule update.  Flu shot - declines prevnar 2017 - got sick with this. Did not get second dose.  Td 2017  Shingrix - first shot received 10/2019 - planning to get second.  COVID vaccine - declines  Advanced directive discussion - has this at home. Husband is HCPOA - asked to bring [place] copy Seat belt use discussed Sunscreen use discussed. No changing moles on skin. Dentist - yearly  Eye exam - due Non smoker Alcohol - none Bowel - no constipation Bladder - no incontinence   Lives with husband [name].      Relevant past medical, surgical, family and social history reviewed and updated as indicated. Interim medical history since our last visit reviewed. Allergies and medications reviewed and updated. Outpatient Medications Prior to Visit  Medication Sig Dispense Refill  . ALPRAZolam (XANAX) 0.25 MG tablet Take 1 tablet (0.25 mg total) by mouth 2 (two) times daily as needed for anxiety or sleep. 30 tablet 0  . aspirin EC 81 MG tablet Take 81 mg  by mouth daily.    Marland Kitchen atorvastatin (LIPITOR) 10 MG tablet Take 1 tablet (10 mg total) by mouth daily. 90 tablet 3  . cetirizine (ZYRTEC) 10 MG tablet TAKE 1 TABLET (10 MG TOTAL) BY MOUTH DAILY. (Patient taking differently: Take 10 mg by mouth at bedtime. ) 90 tablet 0  . hydrochlorothiazide (HYDRODIURIL) 25 MG tablet TAKE 1 TABLET BY MOUTH EVERY DAY 90 tablet 2  . losartan (COZAAR) 50 MG tablet Take 1 tablet (50 mg total) by mouth 2 (two) times daily. 180 tablet 3  . potassium chloride SA (KLOR-CON M20) 20 MEQ tablet Take 1 tablet (20 mEq total) by mouth 2 (two) times daily. 180 tablet 1  . pregabalin (LYRICA) 25 MG capsule Take 1 capsule (25 mg total) by mouth 2 (two) times daily. 60 capsule 3  . traZODone (DESYREL) 50 MG tablet Take 0.5-1 tablets (25-50 mg total) by mouth at  bedtime as needed for sleep. 30 tablet 1  . propranolol ER (INDERAL LA) 80 MG 24 hr capsule Take 1 capsule (80 mg total) by mouth daily. 90 capsule 1   No facility-administered medications prior to visit.     Per HPI unless specifically indicated in ROS section below Review of Systems Objective:    BP 140/90 (BP Location: Right Arm, Cuff Size: Normal)   Pulse 63   Temp (!) 97 F (36.1 C) (Temporal)   Ht 5' 0.5" (1.537 m)   Wt 129 lb 5 oz (58.7 kg)   SpO2 98%   BMI 24.84 kg/m   Wt Readings from Last 3 Encounters:  12/20/19 129 lb 5 oz (58.7 kg)  10/13/19 131 lb 3 oz (59.5 kg)  09/21/19 129 lb (58.5 kg)    Physical Exam Vitals and nursing note reviewed.  Constitutional:      General: She is not in acute distress.    Appearance: Normal appearance. She is well-developed. She is not ill-appearing.  HENT:     Head: Normocephalic and atraumatic.     Right Ear: Tympanic membrane, ear canal and external ear normal. Decreased hearing noted.     Left Ear: Tympanic membrane, ear canal and external ear normal. Decreased hearing noted.     Ears:     Comments: Hearing aides in place, removed for exam Eyes:     General: No scleral icterus.    Extraocular Movements: Extraocular movements intact.     Conjunctiva/sclera: Conjunctivae normal.     Pupils: Pupils are equal, round, and reactive to light.     Comments: Cataracts present bilateraly  Neck:     Thyroid: No thyromegaly or thyroid tenderness.  Cardiovascular:     Rate and Rhythm: Normal rate and regular rhythm.     Pulses: Normal pulses.          Radial pulses are 2+ on the right side and 2+ on the left side.     Heart sounds: Normal heart sounds. No murmur.  Pulmonary:     Effort: Pulmonary effort is normal. No respiratory distress.     Breath sounds: Normal breath sounds. No wheezing, rhonchi or rales.  Abdominal:     General: Abdomen is flat. Bowel sounds are normal. There is no distension.     Palpations: Abdomen is soft.  There is no mass.     Tenderness: There is no abdominal tenderness. There is no guarding or rebound.     Hernia: No hernia is present.  Musculoskeletal:        General: Normal range of  motion.     Cervical back: Normal range of motion and neck supple.     Right lower leg: No edema.     Left lower leg: No edema.  Lymphadenopathy:     Cervical: No cervical adenopathy.  Skin:    General: Skin is warm and dry.     Findings: No rash.  Neurological:     General: No focal deficit present.     Mental Status: She is alert and oriented to person, place, and time.     Comments:  CN grossly intact, station and gait intact Recall 3/3 Calculation 4/5 DLORW  Psychiatric:        Mood and Affect: Mood normal.        Behavior: Behavior normal.        Thought Content: Thought content normal.        Judgment: Judgment normal.       Results for orders placed or performed in visit on 12/15/19  Hemoglobin A1c  Result Value Ref Range   Hgb A1c MFr Bld 6.1 4.6 - 6.5 %  Basic metabolic panel  Result Value Ref Range   Sodium 134 (L) 135 - 145 mEq/L   Potassium 4.0 3.5 - 5.1 mEq/L   Chloride 100 96 - 112 mEq/L   CO2 30 19 - 32 mEq/L   Glucose, Bld 108 (H) 70 - 99 mg/dL   BUN 16 6 - 23 mg/dL   Creatinine, Ser 0.84 0.40 - 1.20 mg/dL   GFR 66.30 >60.00 mL/min   Calcium 9.8 8.4 - 10.5 mg/dL  TSH  Result Value Ref Range   TSH 3.52 0.35 - 4.50 uIU/mL   Lab Results  Component Value Date   CHOL 186 09/22/2019   HDL 41 09/22/2019   LDLCALC 113 (H) 09/22/2019   LDLDIRECT 113.0 01/24/2015   TRIG 162 (H) 09/22/2019   CHOLHDL 4.5 09/22/2019    Assessment & Plan:  This visit occurred during the SARS-CoV-2 public health emergency.  Safety protocols were in place, including screening questions prior to the visit, additional usage of staff PPE, and extensive cleaning of exam room while observing appropriate contact time as indicated for disinfecting solutions.   Problem List Items Addressed This Visit     Pounding in head    This has significantly improved with lower pregabalin dose suggesting it was a side effect of medication. Discussed options of trying Lyrica brand name again vs continuing current lower pregabalin dose with goal to fully taper off. She would like to try "authorized generic" program through [employer] where she may receive generic Lyrica for more affordable price - accordingly I sent in refill of lyrica 50mg  once daily to [facility].       Relevant Medications   pregabalin (LYRICA) 50 MG capsule   PHN (postherpetic neuralgia)    Notes recurrent PHN burning pain with too fast taper off pregabalin - will continue at lower 50mg  once daily dose. Discussed Lyrica options - see below (HA section)      Osteopenia    Update DEXA as overdue.       Relevant Orders   DG Bone Density   Medicare annual wellness visit, subsequent - Primary    I have personally reviewed the Medicare Annual Wellness questionnaire and have noted 1. The patient's medical and social history 2. Their use of alcohol, tobacco or illicit drugs 3. Their current medications and supplements 4. The patient's functional ability including ADL's, fall risks, home safety risks and hearing  or visual impairment. Cognitive function has been assessed and addressed as indicated.  5. Diet and physical activity 6. Evidence for depression or mood disorders The patients weight, height, BMI have been recorded in the chart. I have made referrals, counseling and provided education to the patient based on review of the above and I have provided the pt with a written personalized care plan for preventive services. Provider list updated.. See scanned questionairre as needed for further documentation. Reviewed preventative protocols and updated unless pt declined.       HLD (hyperlipidemia)    Chronic, above goal on atorvastatin 10mg  - continue for now.  The 10-year ASCVD risk score Mikey Bussing DC Brooke Bonito., et al., 2013) is:  20.3%   Values used to calculate the score:     Age: 70 years     Sex: Female     Is Non-Hispanic African American: No     Diabetic: No     Tobacco smoker: No     Systolic Blood Pressure: [id number] mmHg     Is BP treated: Yes     HDL Cholesterol: 41 mg/dL     Total Cholesterol: 186 mg/dL       Essential hypertension    Chronic, overall stable with some fluctuations. Continue losartan 50mg  bid with hctz 25mg  daily.       Decreased visual acuity    Fails vision screen today - and noted cataracts. I have asked her to call and schedule eye exam.       Chronic insomnia    Overall improved with lower pregabalin dose - has not needed trazodone but would like to keep to use if needed.       Anxiety disorder due to general medical condition with panic attack    Overall improvement noted with slow taper off pregabalin - continue to monitor. Continues PRN xanax.       Advanced care planning/counseling discussion    Advanced directive discussion - has this at home. Husband is HCPOA - asked to bring [place] copy          Meds ordered this encounter  Medications  . DISCONTD: pregabalin (LYRICA) 50 MG capsule    Sig: Take 1 capsule (50 mg total) by mouth daily.    Dispense:  30 capsule    Refill:  3  . b complex vitamins capsule    Sig: Take 1 capsule by mouth daily.    Dispense:     . pregabalin (LYRICA) 50 MG capsule    Sig: Take 1 capsule (50 mg total) by mouth daily.    Dispense:  90 capsule    Refill:  1   Orders Placed This Encounter  Procedures  . DG Bone Density    Standing Status:   Future    Standing Expiration Date:   02/18/2021    Order Specific Question:   Reason for Exam (SYMPTOM  OR DIAGNOSIS REQUIRED)    Answer:   osteopenia    Order Specific Question:   Preferred imaging location?    Answer:   [facility]    Patient instructions: We will sign you up for Cologard.  Schedule eye exam at your convenience.  I have sent in Lyrica to [facility] to price out.   Call to schedule mammogram at your convenience: [facility] at [facility] [phone] We will sign you up for bone density scan - hopefully on the same day as the mammogram.  Bring [place] a copy of your living will  to update your chart.  You are doing well today. Continue pregabalin 50mg  once daily for now.  Return as needed or in 6 months for follow up visit.   Follow up plan: Return in about 6 months (around 06/20/2020) for follow up visit.  [name], MD

## 2019-12-20 NOTE — Assessment & Plan Note (Signed)
Advanced directive discussion - has this at home. Husband is HCPOA - asked to bring [place] copy

## 2020-01-04 DIAGNOSIS — Z1211 Encounter for screening for malignant neoplasm of colon: Secondary | ICD-10-CM | POA: Diagnosis not present

## 2020-01-04 DIAGNOSIS — Z1212 Encounter for screening for malignant neoplasm of rectum: Secondary | ICD-10-CM | POA: Diagnosis not present

## 2020-01-04 LAB — COLOGUARD: Cologuard: POSITIVE — AB

## 2020-01-07 LAB — COLOGUARD: COLOGUARD: POSITIVE — AB

## 2020-01-10 ENCOUNTER — Telehealth: Payer: Self-pay

## 2020-01-10 DIAGNOSIS — R195 Other fecal abnormalities: Secondary | ICD-10-CM

## 2020-01-10 NOTE — Addendum Note (Signed)
Addended by: [name] on: 01/10/2020 04:37 PM   Modules accepted: Orders

## 2020-01-10 NOTE — Telephone Encounter (Signed)
Result received and placed in Dr. Synthia Innocent box.

## 2020-01-10 NOTE — Telephone Encounter (Addendum)
plz update chart and notify patient of + cologuard - placed back in Lisa's box.  Will refer to GI to discuss colonoscopy.  Where would she like to go? Referral placed to [facility]

## 2020-01-10 NOTE — Telephone Encounter (Signed)
Robin Peck with [employer] calling with a positive cologuard report. Wanting to verify that Dr Darnell Level received the positive cologuard report that was faxed on 01/07/20. If received report do not need to call [employer] back but if have not received the cologuard report please call [employer] back. sendingnote to Dr Baldwin Crown CMA.

## 2020-01-11 NOTE — Telephone Encounter (Signed)
Documented results in chart.  Spoke with pt relaying Dr. Synthia Innocent message.  Pt verbalizes understanding and prefers to go to Robin Peck.

## 2020-01-14 ENCOUNTER — Ambulatory Visit (INDEPENDENT_AMBULATORY_CARE_PROVIDER_SITE_OTHER): Payer: Medicare Other | Admitting: Gastroenterology

## 2020-01-14 ENCOUNTER — Other Ambulatory Visit: Payer: Self-pay

## 2020-01-14 ENCOUNTER — Encounter: Payer: Self-pay | Admitting: Gastroenterology

## 2020-01-14 VITALS — BP 152/70 | HR 64 | Temp 97.7°F | Ht 61.0 in | Wt 130.2 lb

## 2020-01-14 DIAGNOSIS — Z01818 Encounter for other preprocedural examination: Secondary | ICD-10-CM | POA: Diagnosis not present

## 2020-01-14 DIAGNOSIS — R195 Other fecal abnormalities: Secondary | ICD-10-CM

## 2020-01-14 NOTE — Patient Instructions (Signed)
If you are age 74 or older, your body mass index should be between 23-30. Your Body mass index is 24.6 kg/m. If this is out of the aforementioned range listed, please consider follow up with your Primary Care Provider.  If you are age 63 or younger, your body mass index should be between 19-25. Your Body mass index is 24.6 kg/m. If this is out of the aformentioned range listed, please consider follow up with your Primary Care Provider.   You have been scheduled for a colonoscopy. Please follow written instructions given to you at your visit today.  Please pick up your prep supplies at the pharmacy within the next 1-3 days. If you use inhalers (even only as needed), please bring them with you on the day of your procedure.   Thank you for choosing me and [facility].  Dr. Rush Landmark

## 2020-01-15 ENCOUNTER — Encounter: Payer: Self-pay | Admitting: Gastroenterology

## 2020-01-15 DIAGNOSIS — K635 Polyp of colon: Secondary | ICD-10-CM | POA: Insufficient documentation

## 2020-01-15 NOTE — Progress Notes (Signed)
[facility] VISIT   Primary Care Provider Robin Bush, MD [place] [address] [phone]  Referring Provider Robin Bush, MD [address] [place],  [place] [address] [phone]  Patient Profile: Robin Peck is a 74 y.o. female with a pmh significant for hypertension, hyperlipidemia, anxiety, osteopenia, osteoarthritis/DJD.  The patient presents to the [facility] for an evaluation and management of problem(s) noted below:  Problem List 1. Positive colorectal cancer screening using Cologuard test   2. Preop examination     History of Present Illness This is the patient's first visit to the outpatient [facility].  She recently underwent Cologuard testing for colon cancer screening purposes.  This returned positive.  Patient is referred for discussion of colonoscopy.  Patient is with her husband.  She has never had colon cancer screening previously.  She denies any significant GI symptoms as noted below.  The patient is willing to move forward with colonoscopy if necessary.  The patient has normal bowel movements 2-3 times daily.  There is no family history of GI malignancies.  GI Review of Systems Positive as above Negative for pyrosis, dysphagia, odynophagia, nausea, vomiting, pain, changes in bowel habits, melena, hematochezia  Review of Systems General: Denies fevers/chills/weight loss unintentionally HEENT: Denies oral lesions Cardiovascular: Denies chest pain/palpitations Pulmonary: Denies shortness of breath Gastroenterological: See HPI Genitourinary: Denies darkened urine  Hematological: Denies easy bruising/bleeding Endocrine: Denies temperature intolerance Dermatological: Denies jaundice Psychological: Mood is stable   Medications Current Outpatient Medications  Medication Sig Dispense Refill  . ALPRAZolam (XANAX) 0.25 MG tablet Take 1 tablet (0.25 mg total) by  mouth 2 (two) times daily as needed for anxiety or sleep. 30 tablet 0  . aspirin EC 81 MG tablet Take 81 mg by mouth daily.    Marland Kitchen atorvastatin (LIPITOR) 10 MG tablet Take 1 tablet (10 mg total) by mouth daily. 90 tablet 3  . b complex vitamins capsule Take 1 capsule by mouth daily.    . cetirizine (ZYRTEC) 10 MG tablet TAKE 1 TABLET (10 MG TOTAL) BY MOUTH DAILY. (Patient taking differently: Take 10 mg by mouth at bedtime. ) 90 tablet 0  . hydrochlorothiazide (HYDRODIURIL) 25 MG tablet TAKE 1 TABLET BY MOUTH EVERY DAY 90 tablet 2  . losartan (COZAAR) 50 MG tablet Take 1 tablet (50 mg total) by mouth 2 (two) times daily. 180 tablet 3  . potassium chloride SA (KLOR-CON M20) 20 MEQ tablet Take 1 tablet (20 mEq total) by mouth 2 (two) times daily. 180 tablet 1  . pregabalin (LYRICA) 25 MG capsule Take 1 capsule (25 mg total) by mouth 2 (two) times daily. 60 capsule 3  . pregabalin (LYRICA) 50 MG capsule Take 1 capsule (50 mg total) by mouth daily. 90 capsule 1  . traZODone (DESYREL) 50 MG tablet Take 0.5-1 tablets (25-50 mg total) by mouth at bedtime as needed for sleep. 30 tablet 1  . propranolol ER (INDERAL LA) 80 MG 24 hr capsule Take 1 capsule (80 mg total) by mouth daily. 90 capsule 1   No current facility-administered medications for this visit.    Allergies Allergies  Allergen Reactions  . Valacyclovir Hcl Other (See Comments) and Anaphylaxis    Possible cause of rash, was concurrently on gabapentin Possible cause of rash, was concurrently on gabapentin  . Gabapentin Rash    Possibly caused rash, was concurrently on valtrex.   . Lisinopril     REACTION: cough  Histories Past Medical History:  Diagnosis Date  . Allergic rhinitis   . Hyperlipidemia   . Hypertension    Past Surgical History:  Procedure Laterality Date  . PARTIAL HYSTERECTOMY  2002   hysterectomy   Social History   Socioeconomic History  . Marital status: Married    Spouse name: Not on file  . Number of  children: 1  . Years of education: Not on file  . Highest education level: Not on file  Occupational History  . Not on file  Tobacco Use  . Smoking status: Never Smoker  . Smokeless tobacco: Never Used  Substance and Sexual Activity  . Alcohol use: No  . Drug use: No  . Sexual activity: Not on file  Other Topics Concern  . Not on file  Social History Narrative   Lives with husband [name]. No children.    Social Determinants of Health   Financial Resource Strain:   . Difficulty of Paying Living Expenses:   Food Insecurity:   . Worried About Charity fundraiser in the Last Year:   . Arboriculturist in the Last Year:   Transportation Needs:   . Film/video editor (Medical):   Marland Kitchen Lack of Transportation (Non-Medical):   Physical Activity:   . Days of Exercise per Week:   . Minutes of Exercise per Session:   Stress:   . Feeling of Stress :   Social Connections:   . Frequency of Communication with Friends and Family:   . Frequency of Social Gatherings with Friends and Family:   . Attends Religious Services:   . Active Member of Clubs or Organizations:   . Attends Archivist Meetings:   Marland Kitchen Marital Status:   Intimate Partner Violence:   . Fear of Current or Ex-Partner:   . Emotionally Abused:   Marland Kitchen Physically Abused:   . Sexually Abused:    Family History  Problem Relation Age of Onset  . Heart attack Brother   . Hypertension Brother   . Hypertension Father   . Hypertension Mother   . Stroke Mother   . Hypertension Sister   . Heart attack Maternal Grandfather   . Colon cancer Neg Hx   . Stomach cancer Neg Hx   . Esophageal cancer Neg Hx   . Pancreatic cancer Neg Hx   . Liver cancer Neg Hx   . Inflammatory bowel disease Neg Hx   . Rectal cancer Neg Hx    I have reviewed her medical, social, and family history in detail and updated the electronic medical record as necessary.    PHYSICAL EXAMINATION  BP (!) 152/70   Pulse 64   Temp 97.7 F (36.5 C)    Ht 5\' 1"  (1.549 m)   Wt 130 lb 3.2 oz (59.1 kg)   SpO2 99%   BMI 24.60 kg/m  Wt Readings from Last 3 Encounters:  01/14/20 130 lb 3.2 oz (59.1 kg)  12/20/19 129 lb 5 oz (58.7 kg)  10/13/19 131 lb 3 oz (59.5 kg)  GEN: NAD, appears stated age, doesn't appear chronically ill, accompanied by husband PSYCH: Cooperative, without pressured speech EYE: Conjunctivae pink, sclerae anicteric ENT: MMM CV: Nontachycardic RESP: No audible wheezing GI: Soft oft, NT/ND, without rebound or guarding MSK/EXT: No lower extremity edema SKIN: No jaundice NEURO:  Alert & Oriented x 3, no focal deficits   REVIEW OF DATA  I reviewed the following data at the time of this encounter:  GI Procedures and  Studies  No relevant studies to review  Laboratory Studies  Reviewed those in epic  Imaging Studies  No relevant studies to review   ASSESSMENT  Ms. Muhammad is a 74 y.o. female with a pmh significant for hypertension, hyperlipidemia, anxiety, osteopenia, osteoarthritis/DJD.  The patient is seen today for evaluation and management of:  1. Positive colorectal cancer screening using Cologuard test   2. Preop examination    The patient is clinically and hemodynamically stable.  She has no significant GI symptoms.  However, her Cologuard colorectal cancer screening test has returned positive.  We discussed the role of diagnostic colonoscopy at this point to ensure we are not missing a colon cancer or other advanced lesion.  Reevaluate at time of colonoscopy for other polyps resected as able.  We are going to proceed with her colonoscopy at the end of June or beginning of July as her daughter and son-in-law are coming into town for a few weeks before they leave to [place] for an extended to your service.  I think based on her not having overt red flag symptoms other than the positive Cologuard is okay for [place] to wait a few weeks to get her procedure done.  She will alert [place] if anything else changes or should she  develop rectal bleeding that may require [place] to move forward with procedure sooner.  The risks and benefits of endoscopic evaluation were discussed with the patient; these include but are not limited to the risk of perforation, infection, bleeding, missed lesions, lack of diagnosis, severe illness requiring hospitalization, as well as anesthesia and sedation related illnesses.  The patient is agreeable to proceed.  All patient questions were answered, to the best of my ability, and the patient agrees to the aforementioned plan of action with follow-up as indicated.   PLAN  Proceed with scheduling colonoscopy diagnostic for positive Cologuard findings   Orders Placed This Encounter  Procedures  . SARS Coronavirus 2 ([facility] ONLY)  . Ambulatory referral to Gastroenterology    New Prescriptions   No medications on file   Modified Medications   No medications on file    Planned Follow Up No follow-ups on file.   Total Time in Face-to-Face and in Coordination of Care for patient including independent/personal interpretation/review of prior testing, medical history, examination, medication adjustment, communicating results with the patient directly, and documentation with the EHR is 35 minutes.   Justice Britain, MD [facility] Advanced Endoscopy Office # [phone]

## 2020-02-05 ENCOUNTER — Other Ambulatory Visit: Payer: Self-pay | Admitting: Family Medicine

## 2020-02-21 ENCOUNTER — Telehealth: Payer: Self-pay

## 2020-02-21 DIAGNOSIS — Z1231 Encounter for screening mammogram for malignant neoplasm of breast: Secondary | ICD-10-CM

## 2020-02-21 NOTE — Telephone Encounter (Addendum)
Noted. plz have her find out dates of both shingles shots to update her chart.

## 2020-02-21 NOTE — Telephone Encounter (Signed)
Spoke with pt to get dates of Shingrix shots.  Says 1st dose was 10/13/19 and 2nd dose was 01/15/20.  Updated pt's chart.

## 2020-02-21 NOTE — Telephone Encounter (Signed)
Called pt to get her DEXA scheduled.  Pt would like to try to have her screening mammo done.  Mammo screening order.  She is not having any breast concerns.  She still has problems w/shingles sores under her L arm/breast.  She is taking the gabapentin and pt states you advised to take Tylenol before mammogram to help w/sxs.  Pt also wanted to let you know she had her 2d Shingles vaccine in May 2021@[facility].

## 2020-02-22 NOTE — Telephone Encounter (Signed)
Need screening mammo order.

## 2020-02-22 NOTE — Addendum Note (Signed)
Addended by: [name] on: 02/22/2020 06:20 PM   Modules accepted: Orders

## 2020-02-22 NOTE — Telephone Encounter (Signed)
Order placed

## 2020-02-24 ENCOUNTER — Encounter: Payer: Medicare Other | Admitting: Gastroenterology

## 2020-03-09 ENCOUNTER — Other Ambulatory Visit: Payer: Self-pay | Admitting: Family Medicine

## 2020-03-23 ENCOUNTER — Ambulatory Visit (INDEPENDENT_AMBULATORY_CARE_PROVIDER_SITE_OTHER): Payer: Medicare Other

## 2020-03-23 ENCOUNTER — Other Ambulatory Visit: Payer: Self-pay | Admitting: Gastroenterology

## 2020-03-23 DIAGNOSIS — Z1159 Encounter for screening for other viral diseases: Secondary | ICD-10-CM

## 2020-03-24 LAB — SARS CORONAVIRUS 2 (TAT 6-24 HRS): SARS Coronavirus 2: NEGATIVE

## 2020-03-28 ENCOUNTER — Encounter: Payer: Self-pay | Admitting: Gastroenterology

## 2020-03-28 ENCOUNTER — Other Ambulatory Visit: Payer: Self-pay

## 2020-03-28 ENCOUNTER — Ambulatory Visit (AMBULATORY_SURGERY_CENTER): Payer: Medicare Other | Admitting: Gastroenterology

## 2020-03-28 VITALS — BP 127/52 | HR 52 | Temp 97.1°F | Resp 15 | Ht 61.0 in | Wt 130.0 lb

## 2020-03-28 DIAGNOSIS — R195 Other fecal abnormalities: Secondary | ICD-10-CM | POA: Diagnosis not present

## 2020-03-28 DIAGNOSIS — K635 Polyp of colon: Secondary | ICD-10-CM

## 2020-03-28 DIAGNOSIS — K641 Second degree hemorrhoids: Secondary | ICD-10-CM | POA: Diagnosis not present

## 2020-03-28 MED ORDER — SODIUM CHLORIDE 0.9 % IV SOLN
500.0000 mL | Freq: Once | INTRAVENOUS | Status: DC
Start: 2020-03-28 — End: 2020-03-28

## 2020-03-28 NOTE — Progress Notes (Signed)
V/S-CW  Check-in-JB

## 2020-03-28 NOTE — Patient Instructions (Signed)
Handouts given for polyps, hemorrhoids and high fiber diet.  Use FiberCon tablets, 1-2 daily.  Consider the advanced procedure to remove the polyp that Dr. Rush Landmark discussed with you.  YOU HAD AN ENDOSCOPIC PROCEDURE TODAY AT [facility]:   Refer to the procedure report that was given to you for any specific questions about what was found during the examination.  If the procedure report does not answer your questions, please call your gastroenterologist to clarify.  If you requested that your care partner not be given the details of your procedure findings, then the procedure report has been included in a sealed envelope for you to review at your convenience later.  YOU SHOULD EXPECT: Some feelings of bloating in the abdomen. Passage of more gas than usual.  Walking can help get rid of the air that was put into your GI tract during the procedure and reduce the bloating. If you had a lower endoscopy (such as a colonoscopy or flexible sigmoidoscopy) you may notice spotting of blood in your stool or on the toilet paper. If you underwent a bowel prep for your procedure, you may not have a normal bowel movement for a few days.  Please Note:  You might notice some irritation and congestion in your nose or some drainage.  This is from the oxygen used during your procedure.  There is no need for concern and it should clear up in a day or so.  SYMPTOMS TO REPORT IMMEDIATELY:   Following lower endoscopy (colonoscopy or flexible sigmoidoscopy):  Excessive amounts of blood in the stool  Significant tenderness or worsening of abdominal pains  Swelling of the abdomen that is new, acute  Fever of 100F or higher  For urgent or emergent issues, a gastroenterologist can be reached at any hour by calling [phone]. Do not use MyChart messaging for urgent concerns.    DIET:  We do recommend a small meal at first, but then you may proceed to your regular diet.  Drink plenty of fluids  but you should avoid alcoholic beverages for 24 hours.  ACTIVITY:  You should plan to take it easy for the rest of today and you should NOT DRIVE or use heavy machinery until tomorrow (because of the sedation medicines used during the test).    FOLLOW UP: Our staff will call the number listed on your records 48-72 hours following your procedure to check on you and address any questions or concerns that you may have regarding the information given to you following your procedure. If we do not reach you, we will leave a message.  We will attempt to reach you two times.  During this call, we will ask if you have developed any symptoms of COVID 19. If you develop any symptoms (ie: fever, flu-like symptoms, shortness of breath, cough etc.) before then, please call [phone].  If you test positive for Covid 19 in the 2 weeks post procedure, please call and report this information to [place].    If any biopsies were taken you will be contacted by phone or by letter within the next 1-3 weeks.  Please call us at [phone] if you have not heard about the biopsies in 3 weeks.    SIGNATURES/CONFIDENTIALITY: You and/or your care partner have signed paperwork which will be entered into your electronic medical record.  These signatures attest to the fact that that the information above on your After Visit Summary has been reviewed and is understood.  Full responsibility of  the confidentiality of this discharge information lies with you and/or your care-partner. 

## 2020-03-28 NOTE — Op Note (Signed)
[facility] Patient Name: Robin Peck Procedure Date: 03/28/2020 9:51 AM MRN: [id number] Endoscopist: Justice Britain , MD Age: 74 Referring MD:  Date of Birth: 1946/08/23 Gender: Female Account #: [id number] Procedure:                Colonoscopy Indications:              Positive Cologuard test, This is the patient's                            first colonoscopy Medicines:                Monitored Anesthesia Care Procedure:                Pre-Anesthesia Assessment:                           - Prior to the procedure, a History and Physical                            was performed, and patient medications and                            allergies were reviewed. The patient's tolerance of                            previous anesthesia was also reviewed. The risks                            and benefits of the procedure and the sedation                            options and risks were discussed with the patient.                            All questions were answered, and informed consent                            was obtained. Prior Anticoagulants: The patient has                            taken no previous anticoagulant or antiplatelet                            agents except for aspirin. ASA Grade Assessment: II                            - A patient with mild systemic disease. After                            reviewing the risks and benefits, the patient was                            deemed in satisfactory condition to undergo the  procedure.                           After obtaining informed consent, the colonoscope                            was passed under direct vision. Throughout the                            procedure, the patient's blood pressure, pulse, and                            oxygen saturations were monitored continuously. The                            Colonoscope was introduced through the anus and                             advanced to the 5 cm into the ileum. The                            colonoscopy was performed without difficulty. The                            patient tolerated the procedure. The quality of the                            bowel preparation was good. The terminal ileum,                            ileocecal valve, appendiceal orifice, and rectum                            were photographed. Scope In: 10:06:05 AM Scope Out: 10:20:47 AM Scope Withdrawal Time: 0 hours 11 minutes 7 seconds  Total Procedure Duration: 0 hours 14 minutes 42 seconds  Findings:                 The digital rectal exam findings include                            hemorrhoids. Pertinent negatives include no                            palpable rectal lesions.                           The terminal ileum and ileocecal valve appeared                            normal.                           A 35 mm polyp was found in the cecum. The polyp was  granular lateral spreading. Polypectomy was not                            attempted due to need for EMR in hospital-based                            setting.                           Normal mucosa was found in the entire colon.                           Non-bleeding non-thrombosed internal hemorrhoids                            were found during retroflexion, during perianal                            exam and during digital exam. The hemorrhoids were                            Grade II (internal hemorrhoids that prolapse but                            reduce spontaneously). Complications:            No immediate complications. Estimated Blood Loss:     Estimated blood loss: none. Impression:               - Hemorrhoids found on digital rectal exam.                           - The examined portion of the ileum was normal.                           - One 35 mm polyp in the cecum. Resection not                            attempted.                            - Normal mucosa in the entire examined colon.                           - Non-bleeding non-thrombosed internal hemorrhoids. Recommendation:           - The patient will be observed post-procedure,                            until all discharge criteria are met.                           - Discharge patient to home.                           - Patient has a contact number available for  emergencies. The signs and symptoms of potential                            delayed complications were discussed with the                            patient. Return to normal activities tomorrow.                            Written discharge instructions were provided to the                            patient.                           - High fiber diet.                           - Use FiberCon 1-2 tablets PO daily.                           - Continue present medications.                           - Will discuss with patient and husband today need                            for Advanced Colonoscopy/Polypectomy attempt. If                            they desire can go in further depth in a clinic                            visit otherwise, repeat colonoscopy at next                            available appointment (within 3 months) in the                            hospital-based setting for Colonoscopy with EMR                            attempt.                           - The findings and recommendations were discussed                            with the patient.                           - The findings and recommendations were discussed                            with the patient's family. Justice Britain, MD 03/28/2020 10:26:48 AM

## 2020-03-28 NOTE — Progress Notes (Signed)
PT taken to PACU. Monitors in place. VSS. Report given to RN. 

## 2020-03-30 ENCOUNTER — Telehealth: Payer: Self-pay

## 2020-03-30 NOTE — Telephone Encounter (Signed)
  Follow up Call-  Call back number 03/28/2020  Post procedure Call Back phone  # [phone]  Permission to leave phone message Yes  Some recent data might be hidden     Patient questions:  Do you have a fever, pain , or abdominal swelling? No. Pain Score  0 *  Have you tolerated food without any problems? Yes.    Have you been able to return to your normal activities? Yes.    Do you have any questions about your discharge instructions: Diet   No. Medications  No. Follow up visit  No.  Do you have questions or concerns about your Care? No.  Actions: * If pain score is 4 or above: No action needed, pain <4.  1. Have you developed a fever since your procedure? no  2.   Have you had an respiratory symptoms (SOB or cough) since your procedure? no  3.   Have you tested positive for COVID 19 since your procedure no  4.   Have you had any family members/close contacts diagnosed with the COVID 19 since your procedure?  no   If yes to any of these questions please route to Joylene John, RN and Erenest Rasher, RN

## 2020-04-04 ENCOUNTER — Other Ambulatory Visit: Payer: Self-pay

## 2020-04-04 ENCOUNTER — Ambulatory Visit
Admission: RE | Admit: 2020-04-04 | Discharge: 2020-04-04 | Disposition: A | Discharge status: Home / Self Care | Payer: Medicare Other | Source: Ambulatory Visit | Attending: Family Medicine | Admitting: Family Medicine

## 2020-04-04 DIAGNOSIS — M8588 Other specified disorders of bone density and structure, other site: Secondary | ICD-10-CM

## 2020-04-04 DIAGNOSIS — Z780 Asymptomatic menopausal state: Secondary | ICD-10-CM | POA: Diagnosis not present

## 2020-04-04 DIAGNOSIS — Z1231 Encounter for screening mammogram for malignant neoplasm of breast: Secondary | ICD-10-CM | POA: Insufficient documentation

## 2020-04-04 DIAGNOSIS — M810 Age-related osteoporosis without current pathological fracture: Secondary | ICD-10-CM | POA: Diagnosis not present

## 2020-04-06 ENCOUNTER — Telehealth: Payer: Self-pay | Admitting: Gastroenterology

## 2020-04-06 ENCOUNTER — Other Ambulatory Visit: Payer: Self-pay | Admitting: Family Medicine

## 2020-04-06 DIAGNOSIS — E876 Hypokalemia: Secondary | ICD-10-CM

## 2020-04-06 LAB — HM MAMMOGRAPHY

## 2020-04-06 NOTE — Telephone Encounter (Signed)
Patients husband called to schedule hospital setting procedure per Dr. Rush Landmark notes

## 2020-04-07 ENCOUNTER — Other Ambulatory Visit: Payer: Self-pay

## 2020-04-07 DIAGNOSIS — K635 Polyp of colon: Secondary | ICD-10-CM

## 2020-04-07 NOTE — Telephone Encounter (Signed)
The pt has been scheduled for 06/12/20 at [facility] at 11 am for colon EMR.  COVID test on 06/08/20 the pt husband has been advised and information sent to the pt My Chart.  He did confirm that he can view the information.

## 2020-04-10 ENCOUNTER — Encounter: Payer: Self-pay | Admitting: Family Medicine

## 2020-04-13 ENCOUNTER — Other Ambulatory Visit: Payer: Self-pay | Admitting: Family Medicine

## 2020-04-15 ENCOUNTER — Encounter: Payer: Self-pay | Admitting: Family Medicine

## 2020-05-10 ENCOUNTER — Encounter: Payer: Self-pay | Admitting: Family Medicine

## 2020-05-10 DIAGNOSIS — E876 Hypokalemia: Secondary | ICD-10-CM

## 2020-05-11 MED ORDER — HYDROCHLOROTHIAZIDE 25 MG PO TABS: 25.0000 mg | ORAL_TABLET | Freq: Every day | ORAL | 2 refills | Status: DC

## 2020-05-11 NOTE — Telephone Encounter (Signed)
E-scribed refill 

## 2020-06-02 HISTORY — PX: COLONOSCOPY: SHX174

## 2020-06-08 ENCOUNTER — Other Ambulatory Visit (HOSPITAL_COMMUNITY)
Admission: RE | Admit: 2020-06-08 | Discharge: 2020-06-08 | Disposition: A | Discharge status: Home / Self Care | Payer: Medicare Other | Source: Ambulatory Visit | Attending: Gastroenterology | Admitting: Gastroenterology

## 2020-06-08 DIAGNOSIS — Z01818 Encounter for other preprocedural examination: Secondary | ICD-10-CM | POA: Insufficient documentation

## 2020-06-08 DIAGNOSIS — Z20822 Contact with and (suspected) exposure to covid-19: Secondary | ICD-10-CM | POA: Insufficient documentation

## 2020-06-08 LAB — SARS CORONAVIRUS 2 (TAT 6-24 HRS): SARS Coronavirus 2: NEGATIVE

## 2020-06-09 ENCOUNTER — Encounter (HOSPITAL_COMMUNITY): Payer: Self-pay | Admitting: Gastroenterology

## 2020-06-09 NOTE — Progress Notes (Signed)
Spoke with pt for pre-op call. Pt denies cardiac history or Diabetes. Pt has hx of HTN.   Covid test done 06/08/20 and its negative. Pt states she has been in quarantine since the test was done and understands that she stays in quarantine until she comes to the hospital on Monday.  EKG - 09/21/19

## 2020-06-12 ENCOUNTER — Encounter (HOSPITAL_COMMUNITY): Payer: Self-pay | Admitting: Gastroenterology

## 2020-06-12 ENCOUNTER — Encounter (HOSPITAL_COMMUNITY)
Admission: RE | Disposition: A | Discharge status: Home / Self Care | Payer: Self-pay | Source: Home / Self Care | Attending: Gastroenterology

## 2020-06-12 ENCOUNTER — Ambulatory Visit (HOSPITAL_COMMUNITY): Payer: Medicare Other

## 2020-06-12 ENCOUNTER — Ambulatory Visit (HOSPITAL_COMMUNITY)
Admission: RE | Admit: 2020-06-12 | Discharge: 2020-06-12 | Disposition: A | Discharge status: Home / Self Care | Payer: Medicare Other | Attending: Gastroenterology | Admitting: Gastroenterology

## 2020-06-12 ENCOUNTER — Other Ambulatory Visit: Payer: Self-pay

## 2020-06-12 ENCOUNTER — Ambulatory Visit (HOSPITAL_COMMUNITY): Payer: Medicare Other | Admitting: Anesthesiology

## 2020-06-12 DIAGNOSIS — Z1211 Encounter for screening for malignant neoplasm of colon: Secondary | ICD-10-CM | POA: Diagnosis not present

## 2020-06-12 DIAGNOSIS — E785 Hyperlipidemia, unspecified: Secondary | ICD-10-CM | POA: Diagnosis not present

## 2020-06-12 DIAGNOSIS — D120 Benign neoplasm of cecum: Secondary | ICD-10-CM | POA: Diagnosis not present

## 2020-06-12 DIAGNOSIS — K641 Second degree hemorrhoids: Secondary | ICD-10-CM | POA: Diagnosis not present

## 2020-06-12 DIAGNOSIS — I517 Cardiomegaly: Secondary | ICD-10-CM | POA: Diagnosis not present

## 2020-06-12 DIAGNOSIS — K635 Polyp of colon: Secondary | ICD-10-CM

## 2020-06-12 DIAGNOSIS — J9382 Other air leak: Secondary | ICD-10-CM

## 2020-06-12 DIAGNOSIS — M1990 Unspecified osteoarthritis, unspecified site: Secondary | ICD-10-CM | POA: Diagnosis not present

## 2020-06-12 DIAGNOSIS — I10 Essential (primary) hypertension: Secondary | ICD-10-CM | POA: Diagnosis not present

## 2020-06-12 DIAGNOSIS — Z888 Allergy status to other drugs, medicaments and biological substances status: Secondary | ICD-10-CM | POA: Insufficient documentation

## 2020-06-12 HISTORY — PX: POLYPECTOMY: SHX5525

## 2020-06-12 HISTORY — DX: Anxiety disorder, unspecified: F41.9

## 2020-06-12 HISTORY — PX: HEMOSTASIS CLIP PLACEMENT: SHX6857

## 2020-06-12 HISTORY — PX: SUBMUCOSAL LIFTING INJECTION: SHX6855

## 2020-06-12 HISTORY — PX: ENDOSCOPIC MUCOSAL RESECTION: SHX6839

## 2020-06-12 HISTORY — PX: COLONOSCOPY WITH PROPOFOL: SHX5780

## 2020-06-12 HISTORY — DX: Unspecified osteoarthritis, unspecified site: M19.90

## 2020-06-12 SURGERY — COLONOSCOPY WITH PROPOFOL
Anesthesia: Monitor Anesthesia Care

## 2020-06-12 MED ORDER — CIPROFLOXACIN IN D5W 400 MG/200ML IV SOLN
INTRAVENOUS | Status: AC
  Filled 2020-06-12: qty 200

## 2020-06-12 MED ORDER — CIPROFLOXACIN IN D5W 400 MG/200ML IV SOLN
INTRAVENOUS | Status: DC | PRN
  Administered 2020-06-12: 400 mg via INTRAVENOUS

## 2020-06-12 MED ORDER — PROPOFOL 500 MG/50ML IV EMUL
INTRAVENOUS | Status: DC | PRN
  Administered 2020-06-12: 200 ug/kg/min via INTRAVENOUS

## 2020-06-12 MED ORDER — SODIUM CHLORIDE 0.9 % IV SOLN: INTRAVENOUS | Status: DC

## 2020-06-12 MED ORDER — CIPROFLOXACIN HCL 500 MG PO TABS: 500.0000 mg | ORAL_TABLET | Freq: Two times a day (BID) | ORAL | 0 refills | Status: AC

## 2020-06-12 MED ORDER — LIDOCAINE 2% (20 MG/ML) 5 ML SYRINGE
INTRAMUSCULAR | Status: DC | PRN
  Administered 2020-06-12: 60 mg via INTRAVENOUS

## 2020-06-12 MED ORDER — LACTATED RINGERS IV SOLN: INTRAVENOUS | Status: DC | PRN

## 2020-06-12 MED ORDER — EPHEDRINE SULFATE 50 MG/ML IJ SOLN
INTRAMUSCULAR | Status: DC | PRN
  Administered 2020-06-12 (×3): 5 mg via INTRAVENOUS

## 2020-06-12 SURGICAL SUPPLY — 21 items

## 2020-06-12 NOTE — Transfer of Care (Signed)
Immediate Anesthesia Transfer of Care Note  Patient: Robin Peck  Procedure(s) Performed: COLONOSCOPY WITH PROPOFOL (N/A ) ENDOSCOPIC MUCOSAL RESECTION (N/A ) SUBMUCOSAL LIFTING INJECTION HEMOSTASIS CLIP PLACEMENT POLYPECTOMY  Patient Location: PACU  Anesthesia Type:MAC  Level of Consciousness: awake, alert  and oriented  Airway & Oxygen Therapy: Patient Spontanous Breathing  Post-op Assessment: Report given to RN, Post -op Vital signs reviewed and stable and Patient moving all extremities  Post vital signs: Reviewed and stable    Last Vitals:  Vitals Value Taken Time  BP    Temp 36.2 C 06/12/20 1205  Pulse    Resp    SpO2      Last Pain:  Vitals:   06/12/20 1016  TempSrc: Temporal  PainSc: 0-No pain         Complications: No complications documented.

## 2020-06-12 NOTE — Anesthesia Preprocedure Evaluation (Addendum)
Anesthesia Evaluation  Patient identified by MRN, date of birth, ID band Patient awake    Reviewed: Allergy & Precautions, H&P , NPO status , Patient's Chart, lab work & pertinent test results, reviewed documented beta blocker date and time   Airway Mallampati: II  TM Distance: >3 FB Neck ROM: Full    Dental no notable dental hx. (+) Teeth Intact, Dental Advisory Given   Pulmonary neg pulmonary ROS,    Pulmonary exam normal breath sounds clear to auscultation- rhonchi       Cardiovascular Exercise Tolerance: Good hypertension, Pt. on medications and Pt. on home beta blockers  Rhythm:Regular Rate:Normal  EKG 09/2019  Sinus tachycardia   Neuro/Psych  Headaches, PSYCHIATRIC DISORDERS Anxiety    GI/Hepatic negative GI ROS, Neg liver ROS,   Endo/Other  negative endocrine ROS  Renal/GU negative Renal ROS  negative genitourinary   Musculoskeletal  (+) Arthritis , Osteoarthritis,    Abdominal   Peds  Hematology negative hematology ROS (+)   Anesthesia Other Findings   Reproductive/Obstetrics negative OB ROS                           Anesthesia Physical Anesthesia Plan  ASA: II  Anesthesia Plan: MAC   Post-op Pain Management:    Induction: Intravenous  PONV Risk Score and Plan: 2 and Propofol infusion, TIVA and Treatment may vary due to age or medical condition  Airway Management Planned: Simple Face Mask and Natural Airway  Additional Equipment:   Intra-op Plan:   Post-operative Plan:   Informed Consent: I have reviewed the patients History and Physical, chart, labs and discussed the procedure including the risks, benefits and alternatives for the proposed anesthesia with the patient or authorized representative who has indicated his/her understanding and acceptance.     Dental advisory given  Plan Discussed with: CRNA  Anesthesia Plan Comments:        Anesthesia Quick  Evaluation

## 2020-06-12 NOTE — H&P (Signed)
GASTROENTEROLOGY PROCEDURE H&P NOTE   Primary Care Physician: [name], MD  HPI: Robin Peck is a 74 y.o. female who presents for Colonoscopy with attempt at EMR of large cecal lesion.  Past Medical History:  Diagnosis Date  . Allergic rhinitis   . Allergy    seasonal  . Anxiety   . Arthritis   . Heart murmur   . Hyperlipidemia   . Hypertension    Past Surgical History:  Procedure Laterality Date  . ABDOMINAL HYSTERECTOMY    . COLONOSCOPY    . PARTIAL HYSTERECTOMY  2002   hysterectomy   Current Facility-Administered Medications  Medication Dose Route Frequency Provider Last Rate Last Admin  . 0.9 %  sodium chloride infusion   Intravenous Continuous Mansouraty, Telford Nab., MD       Allergies  Allergen Reactions  . Valacyclovir Hcl Other (See Comments) and Anaphylaxis    Possible cause of rash, was concurrently on gabapentin Possible cause of rash, was concurrently on gabapentin  . Gabapentin Rash    Possibly caused rash, was concurrently on valtrex.   . Lisinopril Cough   Family History  Problem Relation Age of Onset  . Heart attack Brother   . Hypertension Brother   . Hypertension Father   . Hypertension Mother   . Stroke Mother   . Hypertension Sister   . Heart attack Maternal Grandfather   . Colon cancer Neg Hx   . Stomach cancer Neg Hx   . Esophageal cancer Neg Hx   . Pancreatic cancer Neg Hx   . Liver cancer Neg Hx   . Inflammatory bowel disease Neg Hx   . Rectal cancer Neg Hx   . Colon polyps Neg Hx    Social History   Socioeconomic History  . Marital status: Married    Spouse name: Not on file  . Number of children: 1  . Years of education: Not on file  . Highest education level: Not on file  Occupational History  . Not on file  Tobacco Use  . Smoking status: Never Smoker  . Smokeless tobacco: Never Used  Vaping Use  . Vaping Use: Never used  Substance and Sexual Activity  . Alcohol use: Never  . Drug use: No  . Sexual  activity: Not on file  Other Topics Concern  . Not on file  Social History Narrative   Lives with husband [name]. No children.    Social Determinants of Health   Financial Resource Strain:   . Difficulty of Paying Living Expenses: Not on file  Food Insecurity:   . Worried About Charity fundraiser in the Last Year: Not on file  . Ran Out of Food in the Last Year: Not on file  Transportation Needs:   . Lack of Transportation (Medical): Not on file  . Lack of Transportation (Non-Medical): Not on file  Physical Activity:   . Days of Exercise per Week: Not on file  . Minutes of Exercise per Session: Not on file  Stress:   . Feeling of Stress : Not on file  Social Connections:   . Frequency of Communication with Friends and Family: Not on file  . Frequency of Social Gatherings with Friends and Family: Not on file  . Attends Religious Services: Not on file  . Active Member of Clubs or Organizations: Not on file  . Attends Archivist Meetings: Not on file  . Marital Status: Not on file  Intimate Partner Violence:   .  Fear of Current or Ex-Partner: Not on file  . Emotionally Abused: Not on file  . Physically Abused: Not on file  . Sexually Abused: Not on file    Physical Exam: Vital signs in last 24 hours: Temp:  [97.5 F (36.4 C)] 97.5 F (36.4 C) (10/11 1016) Pulse Rate:  [55] 55 (10/11 1016) Resp:  [15] 15 (10/11 1016) BP: (189)/(81) 189/81 (10/11 1016) SpO2:  [100 %] 100 % (10/11 1016) Weight:  [56.7 kg] 56.7 kg (10/11 1016)   GEN: NAD EYE: Sclerae anicteric ENT: MMM CV: Non-tachycardic GI: Soft, NT/ND NEURO:  Alert & Oriented x 3  Lab Results: No results for input(s): WBC, HGB, HCT, PLT in the last 72 hours. BMET No results for input(s): NA, K, CL, CO2, GLUCOSE, BUN, CREATININE, CALCIUM in the last 72 hours. LFT No results for input(s): PROT, ALBUMIN, AST, ALT, ALKPHOS, BILITOT, BILIDIR, IBILI in the last 72 hours. PT/INR No results for input(s):  LABPROT, INR in the last 72 hours.   Impression / Plan: This is a 74 y.o.female who presents for Colonoscopy with attempt at EMR of large cecal lesion.  The risks and benefits of endoscopic evaluation were discussed with the patient; these include but are not limited to the risk of perforation, infection, bleeding, missed lesions, lack of diagnosis, severe illness requiring hospitalization, as well as anesthesia and sedation related illnesses.  The patient is agreeable to proceed.    Justice Britain, MD [facility] Advanced [facility]oscopy Office # [phone]

## 2020-06-12 NOTE — Op Note (Signed)
[facility] Patient Name: Robin Peck Procedure Date : 06/12/2020 MRN: [id number] Attending MD: Justice Britain , MD Date of Birth: 05-25-1946 CSN: [id number] Age: 74 Admit Type: Inpatient Procedure:                Colonoscopy Indications:              Excision of colonic polyp - Found after Positive                            Cologuard for Colon Cancer Screening and recent                            full colonoscopy Providers:                Justice Britain, MD, Jeanella Cara, RN,                            Ladona Ridgel, Technician, Silas Flood, CRNA Referring MD:             [name] Medicines:                Monitored Anesthesia Care, Cipro 370 mg IV Complications:            Perforation Estimated Blood Loss:     Estimated blood loss was minimal. Procedure:                Pre-Anesthesia Assessment:                           - Prior to the procedure, a History and Physical                            was performed, and patient medications and                            allergies were reviewed. The patient's tolerance of                            previous anesthesia was also reviewed. The risks                            and benefits of the procedure and the sedation                            options and risks were discussed with the patient.                            All questions were answered, and informed consent                            was obtained. Prior Anticoagulants: The patient has                            taken no previous anticoagulant or antiplatelet  agents. ASA Grade Assessment: III - A patient with                            severe systemic disease. After reviewing the risks                            and benefits, the patient was deemed in                            satisfactory condition to undergo the procedure.                           After obtaining informed consent, the colonoscope                             was passed under direct vision. Throughout the                            procedure, the patient's blood pressure, pulse, and                            oxygen saturations were monitored continuously. The                            PCF-H190DL ([id number]) Olympus pediatric colonoscope                            was introduced through the anus and advanced to the                            5 cm into the ileum. The colonoscopy was somewhat                            difficult. Successful completion of the procedure                            was aided by performing the maneuvers documented                            (below) in this report. The patient tolerated the                            procedure. The quality of the bowel preparation was                            good. The terminal ileum, ileocecal valve,                            appendiceal orifice, and rectum were photographed. Scope In: 10:57:48 AM Scope Out: 11:55:14 AM Scope Withdrawal Time: 0 hours 53 minutes 57 seconds  Total Procedure Duration: 0 hours 57 minutes 26 seconds  Findings:      The digital rectal exam findings include hemorrhoids. Pertinent       negatives  include no palpable rectal lesions.      The terminal ileum and ileocecal valve appeared normal.      A 35 mm polyp was found in the cecum. The polyp was mixed lateral       spreading. Preparations were made for mucosal resection. NBI imaging and       White-light endoscopy was done to demarcate the borders of the lesion.       Orise gel was injected to raise the lesion and was felt to be adequate       after multiple injections. Piecemeal mucosal resection using a snare was       performed. I initially used an Olympus 20 mm snaremaster but could not       approximate large enough pieces. I then transitioned to a small 10 mm       Boston hot/cold Captivator and proceeded with the resection more       ideally. Resection and retrieval were  complete. Fulguration to ablate       the lateral margin of the resection by Snare Tip Soft Coagulation was       successful. There at one point looked to be a possible deeper submucosal       defect, but no clear perforation. To close the entire defect after       mucosal resection, eleven hemostatic clips were successfully placed (MR       conditional). There was no bleeding at the end of the procedure. Air       insufflation was felt to be adequate throughout the procedure.      A 4 mm polyp was found in the cecum. The polyp was sessile. The polyp       was removed with cold snare polypectomy. Resection and retrieval were       complete.      Normal mucosa was found in the entire colon otherwise.      Non-bleeding non-thrombosed internal hemorrhoids were found during       retroflexion, during perianal exam and during digital exam. The       hemorrhoids were Grade II (internal hemorrhoids that prolapse but reduce       spontaneously). Impression:               - Hemorrhoids found on digital rectal exam.                           - The examined portion of the ileum was normal.                           - One 35 mm polyp in the cecum, removed with                            piecemeal mucosal resection. Resected and                            retrieved. Treated lateral margin with STSC.                            Potential submucosal defect was noted but plan had  been to close defect in any case, so clips (MR                            conditional) were placed to approximate the entire                            resection margin with good effect.                           - One 4 mm polyp in the cecum, removed with a cold                            snare. Resected and retrieved.                           - Normal mucosa in the entire examined colon                            otherwise.                           - Non-bleeding non-thrombosed internal  hemorrhoids. Recommendation:           - The patient will be observed post-procedure,                            until all discharge criteria are met.                           - Proceed to PACU.                           - Will obtain CXR (upright) and KUB                            (supine/upright/decubitus) to define if there is                            any free air.                           - Depending on results and patient status will                            determine ability to be discharged home today vs                            inpatient observation and potential need for                            additional cross-sectional imaging.                           - Diet NPO status for now until re-evaluation.                           -  Will consider short course of antibiotics in                            either case if admitted for observation vs if                            discharged home.                           - Await pathology results.                           - Repeat colonoscopy in 6-9 months for surveillance                            based on pathology results as long as no evidence                            of malignancy on final pathology.                           - The findings and recommendations were discussed                            with the patient.                           - The findings and recommendations were discussed                            with the patient's family. Procedure Code(s):        --- Professional ---                           [id number], Colonoscopy, flexible; with endoscopic                            mucosal resection                           45385, 44, Colonoscopy, flexible; with removal of                            tumor(s), polyp(s), or other lesion(s) by snare                            technique Diagnosis Code(s):        --- Professional ---                           K64.1, Second degree hemorrhoids                            K63.5, Polyp of colon CPT copyright 2019 [employer]. All rights reserved. The codes documented in this report are preliminary and upon coder review may  be revised to meet current compliance requirements.  Justice Britain, MD 06/12/2020 12:19:10 PM Number of Addenda: 0

## 2020-06-12 NOTE — Anesthesia Postprocedure Evaluation (Signed)
Anesthesia Post Note  Patient: Robin Peck  Procedure(s) Performed: COLONOSCOPY WITH PROPOFOL (N/A ) ENDOSCOPIC MUCOSAL RESECTION (N/A ) SUBMUCOSAL LIFTING INJECTION HEMOSTASIS CLIP PLACEMENT POLYPECTOMY     Patient location during evaluation: Endoscopy Anesthesia Type: MAC Level of consciousness: awake and alert Pain management: pain level controlled Vital Signs Assessment: post-procedure vital signs reviewed and stable Respiratory status: spontaneous breathing, nonlabored ventilation and respiratory function stable Cardiovascular status: stable and blood pressure returned to baseline Postop Assessment: no apparent nausea or vomiting Anesthetic complications: no   No complications documented.  Last Vitals:  Vitals:   06/12/20 1215 06/12/20 1235  BP: (!) 141/58 131/62  Pulse: (!) 51 (!) 52  Resp: 15 15  Temp:    SpO2: 100% 100%    Last Pain:  Vitals:   06/12/20 1235  TempSrc:   PainSc: 0-No pain                 Kutler Vanvranken,W. EDMOND

## 2020-06-12 NOTE — Anesthesia Procedure Notes (Signed)
Procedure Name: MAC Date/Time: 06/12/2020 10:50 AM Performed by: Amadeo Garnet, CRNA Pre-anesthesia Checklist: Patient identified, Emergency Drugs available, Suction available and Patient being monitored Patient Re-evaluated:Patient Re-evaluated prior to induction Oxygen Delivery Method: Nasal cannula Preoxygenation: Pre-oxygenation with 100% oxygen Induction Type: IV induction Placement Confirmation: positive ETCO2 Dental Injury: Teeth and Oropharynx as per pre-operative assessment

## 2020-06-13 ENCOUNTER — Encounter: Payer: Self-pay | Admitting: Gastroenterology

## 2020-06-13 LAB — SURGICAL PATHOLOGY

## 2020-06-15 ENCOUNTER — Encounter (HOSPITAL_COMMUNITY): Payer: Self-pay | Admitting: Gastroenterology

## 2020-07-04 ENCOUNTER — Encounter: Payer: Self-pay | Admitting: Family Medicine

## 2020-07-04 ENCOUNTER — Ambulatory Visit (INDEPENDENT_AMBULATORY_CARE_PROVIDER_SITE_OTHER): Payer: Medicare Other | Admitting: Family Medicine

## 2020-07-04 ENCOUNTER — Telehealth: Payer: Self-pay | Admitting: Family Medicine

## 2020-07-04 ENCOUNTER — Other Ambulatory Visit: Payer: Self-pay

## 2020-07-04 VITALS — BP 168/72 | HR 65 | Temp 97.6°F | Ht 60.0 in | Wt 124.2 lb

## 2020-07-04 DIAGNOSIS — I200 Unstable angina: Secondary | ICD-10-CM

## 2020-07-04 DIAGNOSIS — K635 Polyp of colon: Secondary | ICD-10-CM

## 2020-07-04 DIAGNOSIS — B0229 Other postherpetic nervous system involvement: Secondary | ICD-10-CM | POA: Diagnosis not present

## 2020-07-04 DIAGNOSIS — M542 Cervicalgia: Secondary | ICD-10-CM

## 2020-07-04 DIAGNOSIS — M810 Age-related osteoporosis without current pathological fracture: Secondary | ICD-10-CM

## 2020-07-04 DIAGNOSIS — H9311 Tinnitus, right ear: Secondary | ICD-10-CM | POA: Diagnosis not present

## 2020-07-04 DIAGNOSIS — I10 Essential (primary) hypertension: Secondary | ICD-10-CM

## 2020-07-04 MED ORDER — CALCIUM-VITAMIN D 600-400 MG-UNIT PO TABS: 1.0000 | ORAL_TABLET | Freq: Every day | ORAL | Status: DC

## 2020-07-04 MED ORDER — VITAMIN D3 25 MCG (1000 UT) PO CAPS: 1.0000 | ORAL_CAPSULE | Freq: Every day | ORAL | Status: DC

## 2020-07-04 NOTE — Assessment & Plan Note (Deleted)
Ongoing discomfort managing with advil 400mg  daily - discussed bleeding risk of using this after recent colonoscopy. She states tylenol ineffective. Consider voltaren gel topically. She is already on lyrica 50mg  daily.

## 2020-07-04 NOTE — Assessment & Plan Note (Addendum)
Reviewed osteoporosis diagnosis as well as management. Recommend 1200mg  calcium, 1000 IU vit D, regular weight bearing exercise (rec increase treadmill walking to QOD) and discussed pharmacotherapy to strengthen bone including fosamax (oral or IV) and prolia. Reviewed need to complete dental work prior to starting medication. If prolia chosen, reviewed need to be regular with Q69mo dosing to maintain effect and avoid post-prolia fracture. Handouts with information provided - she will research and let us know decision on osteoporosis treatment regimen.

## 2020-07-04 NOTE — Progress Notes (Signed)
This visit was conducted in person.  BP (!) 168/72 (BP Location: Left Arm, Patient Position: Sitting, Cuff Size: Normal)   Pulse 65   Temp 97.6 F (36.4 C) (Temporal)   Ht 5' (1.524 m)   Wt 124 lb 4 oz (56.4 kg)   SpO2 98%   BMI 24.27 kg/m   BP Readings from Last 3 Encounters:  07/04/20 (!) 168/72  06/12/20 131/62  03/28/20 (!) 127/52    CC: discuss DEXA results  Subjective:    Patient ID: Robin Peck, female    DOB: 1945-10-20, 74 y.o.   MRN: [id number]  HPI: Robin Peck is a 74 y.o. female presenting on 07/04/2020 for Results (Wants to discuss DEXA results. )   Here to discuss recent osteoporosis results.  DEXA 04/2020: T score -2.7 spine, -2.6 L hip  She is exercising regularly - walks 1-1.5 mi Q3 days (30 minutes).  Doesn't drink milk.   Currently undergoing dental work - pending implants.   HTN - continues losartan 50mg  bid and hctz 25mg  daily.  Having ongoing neck pain. Has restarted advil 400mg  daily - although is worried about bleeding risk.   btw- decreasing pregabalin dose has helped pounding head sensation.      Relevant past medical, surgical, family and social history reviewed and updated as indicated. Interim medical history since our last visit reviewed. Allergies and medications reviewed and updated. Outpatient Medications Prior to Visit  Medication Sig Dispense Refill  . ALPRAZolam (XANAX) 0.25 MG tablet Take 1 tablet (0.25 mg total) by mouth 2 (two) times daily as needed for anxiety or sleep. 30 tablet 0  . atorvastatin (LIPITOR) 10 MG tablet Take 1 tablet (10 mg total) by mouth daily. (Patient taking differently: Take 10 mg by mouth at bedtime. ) 90 tablet 3  . b complex vitamins capsule Take 1 capsule by mouth daily.    . cetirizine (ZYRTEC) 10 MG tablet TAKE 1 TABLET (10 MG TOTAL) BY MOUTH DAILY. (Patient taking differently: Take 10 mg by mouth daily as needed (seasonal allergies.). ) 90 tablet 0  . hydrochlorothiazide (HYDRODIURIL) 25 MG  tablet Take 1 tablet (25 mg total) by mouth daily. 90 tablet 2  . KLOR-CON M20 20 MEQ tablet TAKE ONE TABLET BY MOUTH TWICE A DAY (Patient taking differently: Take 20 mEq by mouth in the morning and at bedtime. ) 180 tablet 2  . losartan (COZAAR) 50 MG tablet Take 1 tablet (50 mg total) by mouth 2 (two) times daily. 180 tablet 3  . pregabalin (LYRICA) 50 MG capsule Take 1 capsule (50 mg total) by mouth daily. (Patient taking differently: Take 50 mg by mouth daily after breakfast. ) 90 capsule 1  . propranolol ER (INDERAL LA) 80 MG 24 hr capsule TAKE ONE CAPSULE BY MOUTH DAILY (Patient taking differently: Take 80 mg by mouth daily. ) 90 capsule 2  . ibuprofen (ADVIL) 200 MG tablet Take 2 tablets (400 mg total) by mouth daily as needed for mild pain.     No facility-administered medications prior to visit.     Per HPI unless specifically indicated in ROS section below Review of Systems Objective:  BP (!) 168/72 (BP Location: Left Arm, Patient Position: Sitting, Cuff Size: Normal)   Pulse 65   Temp 97.6 F (36.4 C) (Temporal)   Ht 5' (1.524 m)   Wt 124 lb 4 oz (56.4 kg)   SpO2 98%   BMI 24.27 kg/m   Wt Readings from Last 3 Encounters:  07/04/20 124 lb 4 oz (56.4 kg)  06/12/20 125 lb (56.7 kg)  03/28/20 130 lb (59 kg)      Physical Exam Vitals and nursing note reviewed.  Constitutional:      Appearance: Normal appearance. She is not ill-appearing.  Cardiovascular:     Rate and Rhythm: Normal rate and regular rhythm.     Pulses: Normal pulses.     Heart sounds: Normal heart sounds. No murmur heard.   Pulmonary:     Effort: Pulmonary effort is normal. No respiratory distress.     Breath sounds: Normal breath sounds. No wheezing, rhonchi or rales.  Musculoskeletal:     Right lower leg: No edema.     Left lower leg: No edema.  Neurological:     Mental Status: She is alert.  Psychiatric:        Mood and Affect: Mood normal.        Behavior: Behavior normal.       Assessment  & Plan:  This visit occurred during the SARS-CoV-2 public health emergency.  Safety protocols were in place, including screening questions prior to the visit, additional usage of staff PPE, and extensive cleaning of exam room while observing appropriate contact time as indicated for disinfecting solutions.   Problem List Items Addressed This Visit    Tinnitus aurium, right   PHN (postherpetic neuralgia)    lyrica dose decreased to 50mg  daily with improvement in pounding head sensation. When she tried to wean off, felt return of postherpetic neuralgia. Desires to continue at current dose.       Osteoporosis - Primary    Reviewed osteoporosis diagnosis as well as management. Recommend 1200mg  calcium, 1000 IU vit D, regular weight bearing exercise (rec increase treadmill walking to QOD) and discussed pharmacotherapy to strengthen bone including fosamax (oral or IV) and prolia. Reviewed need to complete dental work prior to starting medication. If prolia chosen, reviewed need to be regular with Q80mo dosing to maintain effect and avoid post-prolia fracture. Handouts with information provided - she will research and let us know decision on osteoporosis treatment regimen.       Relevant Medications   Calcium Carb-Cholecalciferol (CALCIUM-VITAMIN D) 600-400 MG-UNIT TABS   Cholecalciferol (VITAMIN D3) 25 MCG (1000 UT) CAPS   Essential hypertension    BP elevated today despite losartan 50mg  bid and hctz 25mg  daily - I will ask her to monitor at home and call us with home readings in 1 wk.       Cervical pain (neck)    Ongoing discomfort managing with advil 400mg  daily - discussed bleeding risk of using this after recent colonoscopy. She states tylenol ineffective. Consider voltaren gel topically. She is already on lyrica 50mg  daily.       Cecal polyp    Recent colonoscopy results reviewed. Appreciate GI care. Planned rpt colonoscopy in 6-9 months.           Meds ordered this encounter   Medications  . Calcium Carb-Cholecalciferol (CALCIUM-VITAMIN D) 600-400 MG-UNIT TABS    Sig: Take 1 tablet by mouth daily.  . Cholecalciferol (VITAMIN D3) 25 MCG (1000 UT) CAPS    Sig: Take 1 capsule (1,000 Units total) by mouth daily.    Dispense:  30 capsule   No orders of the defined types were placed in this encounter.   Patient instructions: Goal calcium is 1200mg /day.  Try to get most or all of your calcium from your food--aim for 1000 mg/day for women up to 50  and men up to 70 and 1200 mg/day for women over 48 and men over 70. To figure out dietary calcium: 300 mg/day from all non dairy foods plus 300 mg per cup of milk, other dairy, or fortified juice. Non dairy foods that contain calcium: Kale, oranges, sardines, oatmeal, soy milk/soybeans, salmon, white beans, dried figs, turnip greens, almonds, broccoli, tofu.  Start calcium 600mg /vitamin D 400 units daily over the counter supplement.  Look into lactose free milk or almond milk, soy milk etc - to try and get good source of calcium.   Goal vitamin D is 1000 units daily. Start vitamin D 1000 units supplement daily.   Consider prolia injection every 6 months to help with bone strength and help prevent fractures. Let us know if interested and we can price out for you.   Follow up plan: Return if symptoms worsen or fail to improve.  [name], MD

## 2020-07-04 NOTE — Assessment & Plan Note (Signed)
Ongoing discomfort managing with advil 400mg  daily - discussed bleeding risk of using this after recent colonoscopy. She states tylenol ineffective. Consider voltaren gel topically. She is already on lyrica 50mg  daily.

## 2020-07-04 NOTE — Assessment & Plan Note (Signed)
Recent colonoscopy results reviewed. Appreciate GI care. Planned rpt colonoscopy in 6-9 months.

## 2020-07-04 NOTE — Assessment & Plan Note (Addendum)
BP elevated today despite losartan 50mg  bid and hctz 25mg  daily - I will ask her to monitor at home and call us with home readings in 1 wk.

## 2020-07-04 NOTE — Patient Instructions (Addendum)
Goal calcium is $RemoveBe'1200mg'ufPaWZYSc$ /day.  Try to get most or all of your calcium from your food--aim for 1000 mg/day for women up to 34 and men up to 70 and 1200 mg/day for women over 23 and men over 70. To figure out dietary calcium: 300 mg/day from all non dairy foods plus 300 mg per cup of milk, other dairy, or fortified juice. Non dairy foods that contain calcium: Kale, oranges, sardines, oatmeal, soy milk/soybeans, salmon, white beans, dried figs, turnip greens, almonds, broccoli, tofu.  Start calcium $RemoveBefor'600mg'SUkvMHNqowoQ$ /vitamin D 400 units daily over the counter supplement.  Look into lactose free milk or almond milk, soy milk etc - to try and get good source of calcium.   Goal vitamin D is 1000 units daily. Start vitamin D 1000 units supplement daily.   Consider prolia injection every 6 months to help with bone strength and help prevent fractures. Let us know if interested and we can price out for you.   Denosumab injection What is this medicine? DENOSUMAB (den oh sue mab) slows bone breakdown. Prolia is used to treat osteoporosis in women after menopause and in men, and in people who are taking corticosteroids for 6 months or more. Delton See is used to treat a high calcium level due to cancer and to prevent bone fractures and other bone problems caused by multiple myeloma or cancer bone metastases. Delton See is also used to treat giant cell tumor of the bone. This medicine may be used for other purposes; ask your health care provider or pharmacist if you have questions. COMMON BRAND NAME(S): Prolia, XGEVA What should I tell my health care provider before I take this medicine? They need to know if you have any of these conditions:  dental disease  having surgery or tooth extraction  infection  kidney disease  low levels of calcium or Vitamin D in the blood  malnutrition  on hemodialysis  skin conditions or sensitivity  thyroid or parathyroid disease  an unusual reaction to denosumab, other medicines,  foods, dyes, or preservatives  pregnant or trying to get pregnant  breast-feeding How should I use this medicine? This medicine is for injection under the skin. It is given by a health care professional in a hospital or clinic setting. A special MedGuide will be given to you before each treatment. Be sure to read this information carefully each time. For Prolia, talk to your pediatrician regarding the use of this medicine in children. Special care may be needed. For Delton See, talk to your pediatrician regarding the use of this medicine in children. While this drug may be prescribed for children as young as 13 years for selected conditions, precautions do apply. Overdosage: If you think you have taken too much of this medicine contact a poison control center or emergency room at once. NOTE: This medicine is only for you. Do not share this medicine with others. What if I miss a dose? It is important not to miss your dose. Call your doctor or health care professional if you are unable to keep an appointment. What may interact with this medicine? Do not take this medicine with any of the following medications:  other medicines containing denosumab This medicine may also interact with the following medications:  medicines that lower your chance of fighting infection  steroid medicines like prednisone or cortisone This list may not describe all possible interactions. Give your health care provider a list of all the medicines, herbs, non-prescription drugs, or dietary supplements you use. Also tell them if  you smoke, drink alcohol, or use illegal drugs. Some items may interact with your medicine. What should I watch for while using this medicine? Visit your doctor or health care professional for regular checks on your progress. Your doctor or health care professional may order blood tests and other tests to see how you are doing. Call your doctor or health care professional for advice if you get a  fever, chills or sore throat, or other symptoms of a cold or flu. Do not treat yourself. This drug may decrease your body's ability to fight infection. Try to avoid being around people who are sick. You should make sure you get enough calcium and vitamin D while you are taking this medicine, unless your doctor tells you not to. Discuss the foods you eat and the vitamins you take with your health care professional. See your dentist regularly. Brush and floss your teeth as directed. Before you have any dental work done, tell your dentist you are receiving this medicine. Do not become pregnant while taking this medicine or for 5 months after stopping it. Talk with your doctor or health care professional about your birth control options while taking this medicine. Women should inform their doctor if they wish to become pregnant or think they might be pregnant. There is a potential for serious side effects to an unborn child. Talk to your health care professional or pharmacist for more information. What side effects may I notice from receiving this medicine? Side effects that you should report to your doctor or health care professional as soon as possible:  allergic reactions like skin rash, itching or hives, swelling of the face, lips, or tongue  bone pain  breathing problems  dizziness  jaw pain, especially after dental work  redness, blistering, peeling of the skin  signs and symptoms of infection like fever or chills; cough; sore throat; pain or trouble passing urine  signs of low calcium like fast heartbeat, muscle cramps or muscle pain; pain, tingling, numbness in the hands or feet; seizures  unusual bleeding or bruising  unusually weak or tired Side effects that usually do not require medical attention (report to your doctor or health care professional if they continue or are bothersome):  constipation  diarrhea  headache  joint pain  loss of appetite  muscle pain  runny  nose  tiredness  upset stomach This list may not describe all possible side effects. Call your doctor for medical advice about side effects. You may report side effects to FDA at 1-800-FDA-1088. Where should I keep my medicine? This medicine is only given in a clinic, doctor's office, or other health care setting and will not be stored at home. NOTE: This sheet is a summary. It may not cover all possible information. If you have questions about this medicine, talk to your doctor, pharmacist, or health care provider.  2020 Elsevier/Gold Standard (2017-12-26 16:10:44)   Osteoporosis  Osteoporosis is thinning and loss of density in your bones. Osteoporosis makes bones more brittle and fragile and more likely to break (fracture). Over time, osteoporosis can cause your bones to become so weak that they fracture after a minor fall. Bones in the hip, wrist, and spine are most likely to fracture due to osteoporosis. What are the causes? The exact cause of this condition is not known. What increases the risk? You may be at greater risk for osteoporosis if you:  Have a family history of the condition.  Have poor nutrition.  Use steroid medicines, such  as prednisone.  Are female.  Are age 53 or older.  Smoke or have a history of smoking.  Are not physically active (are sedentary).  Are white (Caucasian) or of Asian descent.  Have a small body frame.  Take certain medicines, such as antiseizure medicines. What are the signs or symptoms? A fracture might be the first sign of osteoporosis, especially if the fracture results from a fall or injury that usually would not cause a bone to break. Other signs and symptoms include:  Pain in the neck or low back.  Stooped posture.  Loss of height. How is this diagnosed? This condition may be diagnosed based on:  Your medical history.  A physical exam.  A bone mineral density test, also called a DXA or DEXA test (dual-energy X-ray  absorptiometry test). This test uses X-rays to measure the amount of minerals in your bones. How is this treated? The goal of treatment is to strengthen your bones and lower your risk for a fracture. Treatment may involve:  Making lifestyle changes, such as: ? Including foods with more calcium and vitamin D in your diet. ? Doing weight-bearing and muscle-strengthening exercises. ? Stopping tobacco use. ? Limiting alcohol intake.  Taking medicine to slow the process of bone loss or to increase bone density.  Taking daily supplements of calcium and vitamin D.  Taking hormone replacement medicines, such as estrogen for women and testosterone for men.  Monitoring your levels of calcium and vitamin D. Follow these instructions at home:  Activity  Exercise as told by your health care provider. Ask your health care provider what exercises and activities are safe for you. You should do: ? Exercises that make you work against gravity (weight-bearing exercises), such as tai chi, yoga, or walking. ? Exercises to strengthen muscles, such as lifting weights. Lifestyle  Limit alcohol intake to no more than 1 drink a day for nonpregnant women and 2 drinks a day for men. One drink equals 12 oz of beer, 5 oz of wine, or 1 oz of hard liquor.  Do not use any products that contain nicotine or tobacco, such as cigarettes and e-cigarettes. If you need help quitting, ask your health care provider. Preventing falls  Use devices to help you move around (mobility aids) as needed, such as canes, walkers, scooters, or crutches.  Keep rooms well-lit and clutter-free.  Remove tripping hazards from walkways, including cords and throw rugs.  Install grab bars in bathrooms and safety rails on stairs.  Use rubber mats in the bathroom and other areas that are often wet or slippery.  Wear closed-toe shoes that fit well and support your feet. Wear shoes that have rubber soles or low heels.  Review your  medicines with your health care provider. Some medicines can cause dizziness or changes in blood pressure, which can increase your risk of falling. General instructions  Include calcium and vitamin D in your diet. Calcium is important for bone health, and vitamin D helps your body to absorb calcium. Good sources of calcium and vitamin D include: ? Certain fatty fish, such as salmon and tuna. ? Products that have calcium and vitamin D added to them (fortified products), such as fortified cereals. ? Egg yolks. ? Cheese. ? Liver.  Take over-the-counter and prescription medicines only as told by your health care provider.  Keep all follow-up visits as told by your health care provider. This is important. Contact a health care provider if:  You have never been screened for osteoporosis  and you are: ? A woman who is age 74 or older. ? A man who is age 48 or older. Get help right away if:  You fall or injure yourself. Summary  Osteoporosis is thinning and loss of density in your bones. This makes bones more brittle and fragile and more likely to break (fracture),even with minor falls.  The goal of treatment is to strengthen your bones and reduce your risk for a fracture.  Include calcium and vitamin D in your diet. Calcium is important for bone health, and vitamin D helps your body to absorb calcium.  Talk with your health care provider about screening for osteoporosis if you are a woman who is age 31 or older, or a man who is age 71 or older. This information is not intended to replace advice given to you by your health care provider. Make sure you discuss any questions you have with your health care provider. Document Revised: 08/01/2017 Document Reviewed: 06/13/2017 Elsevier Patient Education  2020 [employer].

## 2020-07-04 NOTE — Assessment & Plan Note (Signed)
lyrica dose decreased to 50mg  daily with improvement in pounding head sensation. When she tried to wean off, felt return of postherpetic neuralgia. Desires to continue at current dose.

## 2020-07-04 NOTE — Telephone Encounter (Addendum)
Did not discuss at OV - BP was high today - I'd like her to start monitoring daily at home and call us next week with BP readings to ensure better control. If staying high, we will discuss med change.  Also - I'd like her to try OTC topical anti inflammatory voltaren (diclofenac) gel to the neck in place of the advil. This may have less systemic effects. May use TID PRN.

## 2020-07-05 NOTE — Telephone Encounter (Signed)
Spoke with pt relaying Dr. G's message. Pt verbalizes understanding.  

## 2020-07-13 ENCOUNTER — Encounter: Payer: Self-pay | Admitting: Family Medicine

## 2020-08-10 ENCOUNTER — Other Ambulatory Visit: Payer: Self-pay | Admitting: Family Medicine

## 2020-08-10 DIAGNOSIS — F064 Anxiety disorder due to known physiological condition: Secondary | ICD-10-CM

## 2020-08-11 NOTE — Telephone Encounter (Signed)
Last office visit 07/04/2020 for Discuss Dexa Results.  Last refilled 10/13/2019 for #30 with no refills .  No future appointments.

## 2020-08-14 MED ORDER — ALPRAZOLAM 0.25 MG PO TABS: 0.2500 mg | ORAL_TABLET | Freq: Two times a day (BID) | ORAL | 0 refills | Status: DC | PRN

## 2020-08-14 NOTE — Telephone Encounter (Signed)
ERx 

## 2020-08-30 DIAGNOSIS — Z23 Encounter for immunization: Secondary | ICD-10-CM | POA: Diagnosis not present

## 2020-10-07 ENCOUNTER — Other Ambulatory Visit: Payer: Self-pay | Admitting: Family Medicine

## 2020-11-01 ENCOUNTER — Other Ambulatory Visit: Payer: Self-pay

## 2020-11-01 MED ORDER — PREGABALIN 50 MG PO CAPS
50.0000 mg | ORAL_CAPSULE | Freq: Every day | ORAL | 1 refills | Status: DC
Start: 2020-11-01 — End: 2023-06-16

## 2020-11-01 NOTE — Telephone Encounter (Signed)
ERx 

## 2020-11-01 NOTE — Telephone Encounter (Signed)
Pt's husband here for OV today and requests Lyrica refill for pt.  Asking for 90-day supply since they will be traveling to [place] soon.  Name of Medication: [place] Name of Pharmacy: [facility] Date and Quantity: 12/20/19, #90 Last Office Visit and Type: 07/04/20, DEXA result f/u Next Office Visit and Type: none Last Controlled Substance Agreement Date: none Last UDS: none

## 2020-11-18 ENCOUNTER — Other Ambulatory Visit: Payer: Self-pay | Admitting: Family Medicine

## 2020-11-21 ENCOUNTER — Other Ambulatory Visit: Payer: Self-pay | Admitting: Family Medicine

## 2020-11-21 DIAGNOSIS — E7800 Pure hypercholesterolemia, unspecified: Secondary | ICD-10-CM

## 2020-11-22 NOTE — Telephone Encounter (Signed)
Pharmacy requests refill on: Atorvastatin 10 mg   LAST REFILL: 11/30/2019 (Q-90, R-3) LAST OV: 07/04/2020 NEXT OV: Not Scheduled  PHARMACY: [place], [place]

## 2021-01-13 ENCOUNTER — Other Ambulatory Visit: Payer: Self-pay | Admitting: Family Medicine

## 2021-01-13 DIAGNOSIS — E876 Hypokalemia: Secondary | ICD-10-CM

## 2021-01-16 ENCOUNTER — Other Ambulatory Visit: Payer: Self-pay | Admitting: Family Medicine

## 2021-01-17 NOTE — Telephone Encounter (Signed)
Pharmacy requests refill on: Propanolol ER 80 mg   LAST REFILL: 04/13/2020 (Q-90, R-2) LAST OV: 07/03/2020 NEXT OV: Not Scheduled  PHARMACY: [facility] requests refill on: Hydrochlorothiazide 25 mg   LAST REFILL: 05/11/2020 (Q-90, R-2) LAST OV: 07/03/2020 NEXT OV: Not Scheduled  PHARMACY: [place]

## 2021-01-25 IMAGING — MG DIGITAL SCREENING BILAT W/ TOMO W/ CAD
6 of 12 series · 6 of 36 positions shown · non-contrast
Comparison: Previous exam(s).

CLINICAL DATA: Screening.

EXAM:
DIGITAL SCREENING BILATERAL MAMMOGRAM WITH TOMO AND CAD

[R XCCL synth-2D]
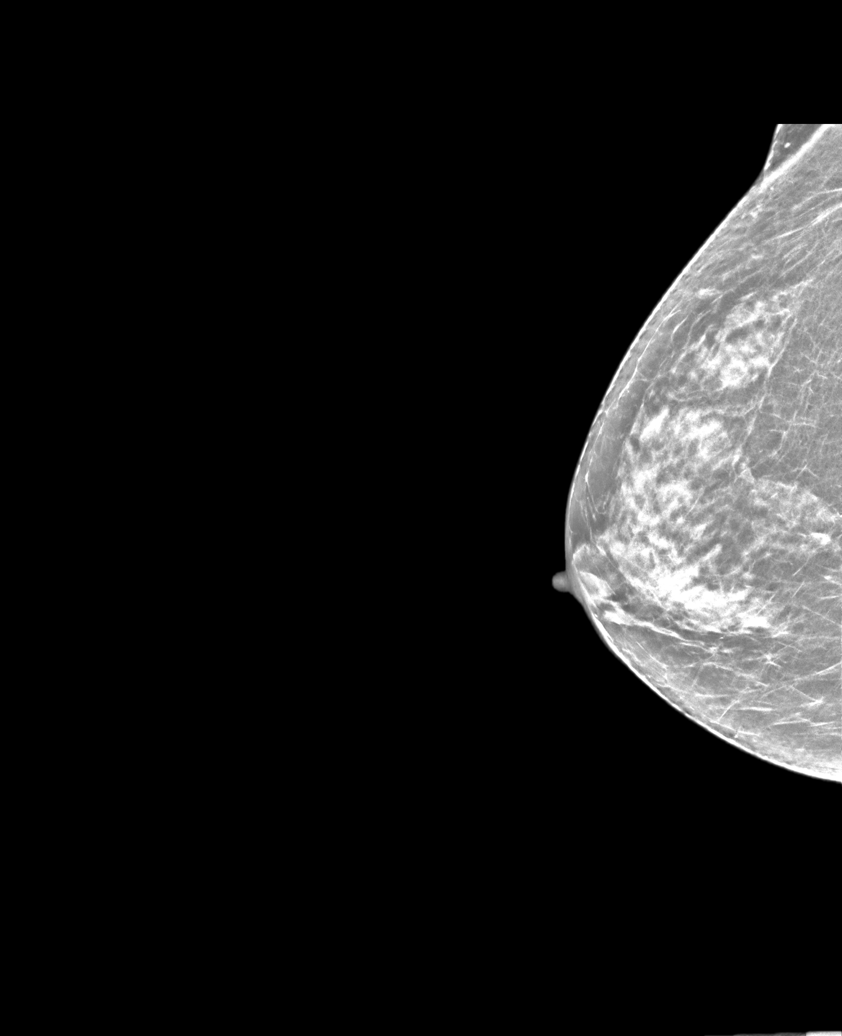

[L XCCL synth-2D]
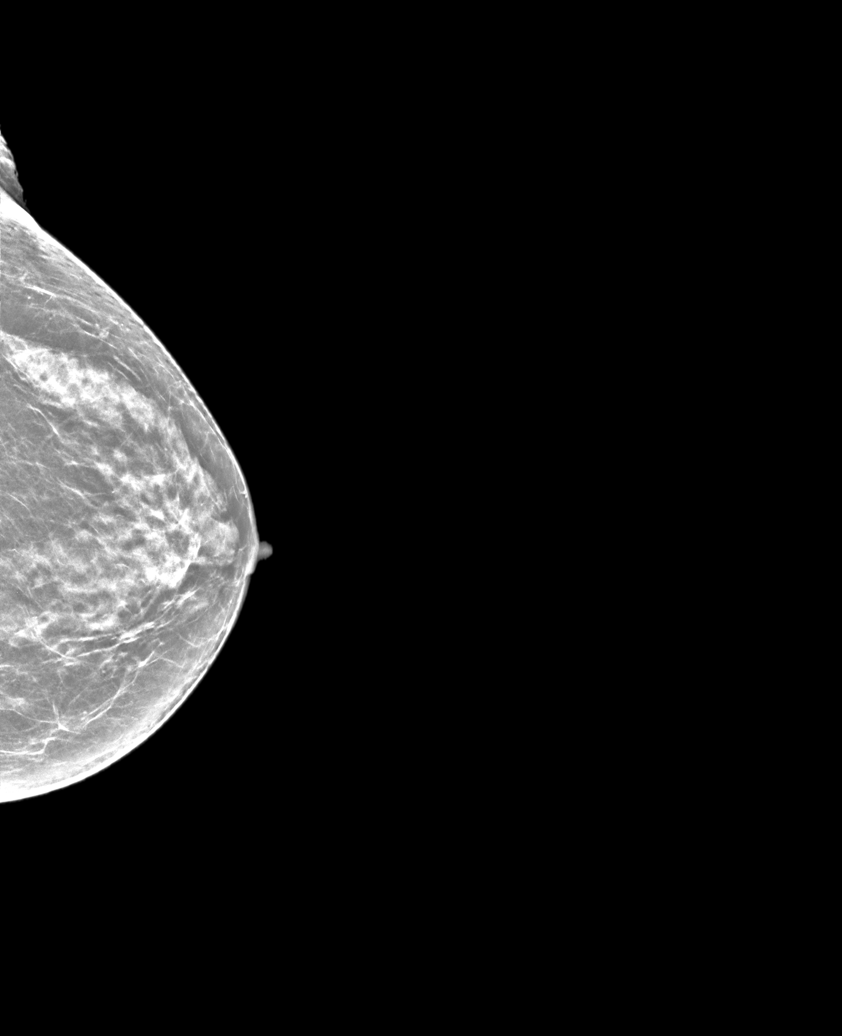

[R CC synth-2D]
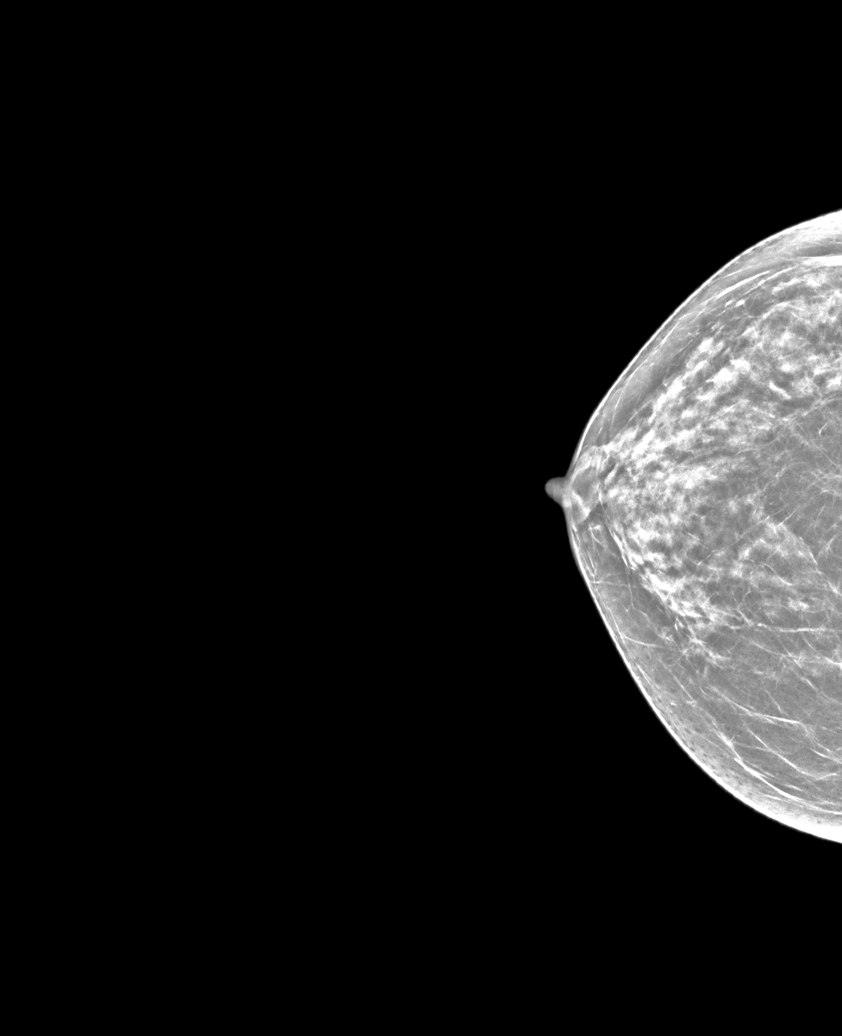

[L MLO synth-2D]
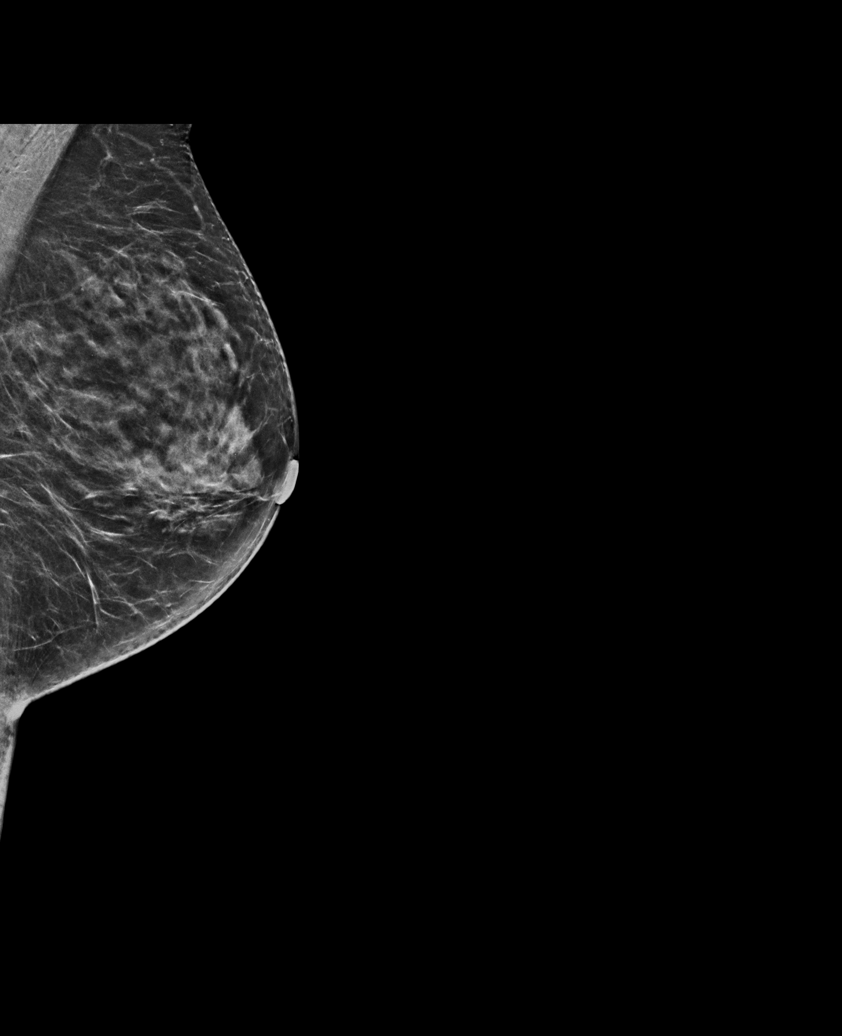

[R MLO synth-2D]
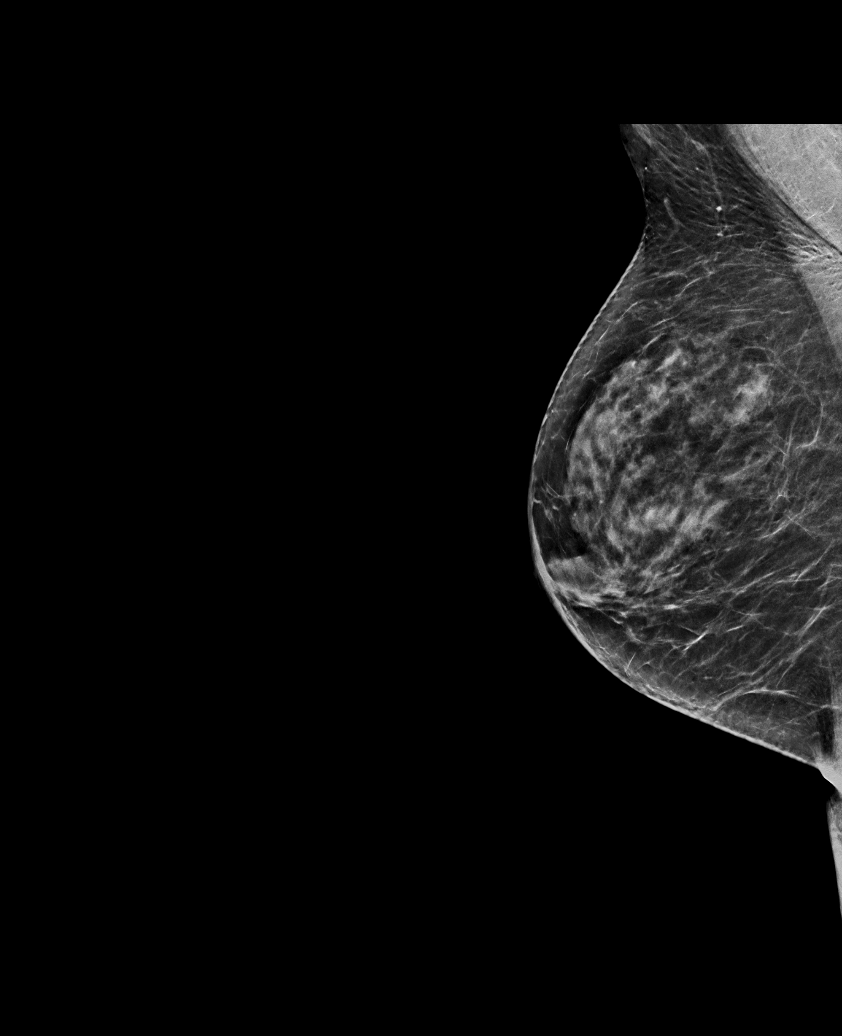

[L CC synth-2D]
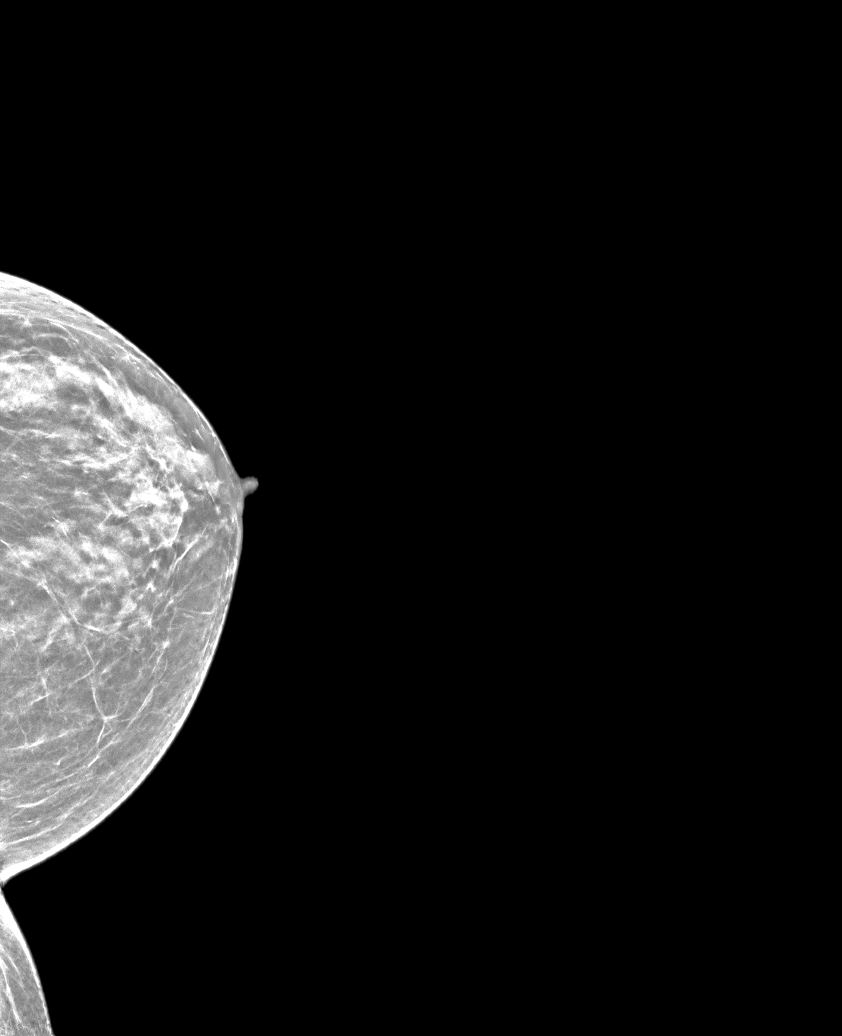

[6 of 36 positions shown; findings below may reference images not displayed]

ACR Breast Density Category c: The breast tissue is heterogeneously
dense, which may obscure small masses.
FINDINGS: There are no findings suspicious for malignancy. Images were
processed with CAD.
IMPRESSION: No mammographic evidence of malignancy. A result letter of this
screening mammogram will be mailed directly to the patient.

RECOMMENDATION:
Screening mammogram in one year. (Code:[id number])

BI-RADS CATEGORY  1: Negative.

## 2021-02-02 ENCOUNTER — Other Ambulatory Visit (INDEPENDENT_AMBULATORY_CARE_PROVIDER_SITE_OTHER): Payer: Medicare Other

## 2021-02-02 ENCOUNTER — Encounter: Payer: Self-pay | Admitting: Family Medicine

## 2021-02-02 ENCOUNTER — Telehealth (INDEPENDENT_AMBULATORY_CARE_PROVIDER_SITE_OTHER): Payer: Medicare Other | Admitting: Family Medicine

## 2021-02-02 VITALS — BP 153/72 | HR 68 | Temp 97.5°F | Ht 60.0 in | Wt 126.0 lb

## 2021-02-02 DIAGNOSIS — I10 Essential (primary) hypertension: Secondary | ICD-10-CM

## 2021-02-02 DIAGNOSIS — U071 COVID-19: Secondary | ICD-10-CM

## 2021-02-02 HISTORY — DX: COVID-19: U07.1

## 2021-02-02 LAB — BASIC METABOLIC PANEL
BUN: 10 mg/dL (ref 7–25)
CO2: 27 mmol/L (ref 20–32)
Calcium: 9.6 mg/dL (ref 8.6–10.4)
Chloride: 92 mmol/L — ABNORMAL LOW (ref 98–110)
Creat: 0.75 mg/dL (ref 0.60–0.93)
Glucose, Bld: 127 mg/dL — ABNORMAL HIGH (ref 65–99)
Potassium: 3.9 mmol/L (ref 3.5–5.3)
Sodium: 125 mmol/L — ABNORMAL LOW (ref 135–146)

## 2021-02-02 MED ORDER — NIRMATRELVIR/RITONAVIR (PAXLOVID)TABLET: 3.0000 | ORAL_TABLET | Freq: Two times a day (BID) | ORAL | 0 refills | Status: AC

## 2021-02-02 NOTE — Addendum Note (Signed)
Addended by: Cloyd Stagers on: 02/02/2021 02:41 PM   Modules accepted: Orders

## 2021-02-02 NOTE — Progress Notes (Signed)
Patient ID: Robin Peck, female    DOB: 22-Apr-1946, 75 y.o.   MRN: [id number]  Virtual visit completed through [facility], a video enabled telemedicine application. Due to national recommendations of social distancing due to COVID-19, a virtual visit is felt to be most appropriate for this patient at this time. Reviewed limitations, risks, security and privacy concerns of performing a virtual visit and the availability of in person appointments. I also reviewed that there may be a patient responsible charge related to this service. The patient agreed to proceed.   Patient location: home Provider location: [place] at [facility], office Persons participating in this virtual visit: patient, provider, husband  If any vitals were documented, they were collected by patient at home unless specified below.    BP (!) 153/72   Pulse 68   Temp (!) 97.5 F (36.4 C)   Ht 5' (1.524 m)   Wt 126 lb (57.2 kg)   SpO2 94%   BMI 24.61 kg/m    CC: COVID positive Subjective:   HPI: Robin Peck is a 75 y.o. female presenting on 02/02/2021 for Cough (C/o cough, fatigue, HA and body aches.  Sxs started 01/31/21.  Home COVID test results yesterday, pos. )   First day of symptoms: 6/1 Tested COVID positive: 6/2  Current symptoms: cough, fatigue, HA, body aches, ST No: dyspnea, abd pain, diarrhea, nausea, loss of taste/smell  Treatments to date: tylenol, advil  Risk factors include: age, HTN   COVID vaccination status: none      Relevant past medical, surgical, family and social history reviewed and updated as indicated. Interim medical history since our last visit reviewed. Allergies and medications reviewed and updated. Outpatient Medications Prior to Visit  Medication Sig Dispense Refill  . ALPRAZolam (XANAX) 0.25 MG tablet Take 1 tablet (0.25 mg total) by mouth 2 (two) times daily as needed for anxiety or sleep. 30 tablet 0  . atorvastatin (LIPITOR) 10 MG tablet TAKE ONE TABLET BY MOUTH DAILY  90 tablet 0  . b complex vitamins capsule Take 1 capsule by mouth daily.    . Calcium Carb-Cholecalciferol (CALCIUM-VITAMIN D) 600-400 MG-UNIT TABS Take 1 tablet by mouth daily.    . cetirizine (ZYRTEC) 10 MG tablet TAKE 1 TABLET (10 MG TOTAL) BY MOUTH DAILY. (Patient taking differently: Take 10 mg by mouth daily as needed (seasonal allergies.).) 90 tablet 0  . Cholecalciferol (VITAMIN D3) 25 MCG (1000 UT) CAPS Take 1 capsule (1,000 Units total) by mouth daily. 30 capsule   . hydrochlorothiazide (HYDRODIURIL) 25 MG tablet TAKE ONE TABLET BY MOUTH DAILY 90 tablet 0  . ibuprofen (ADVIL) 200 MG tablet Take 2 tablets (400 mg total) by mouth daily as needed for mild pain.    Marland Kitchen KLOR-CON M20 20 MEQ tablet TAKE ONE TABLET BY MOUTH TWICE A DAY 60 tablet 0  . losartan (COZAAR) 50 MG tablet TAKE ONE TABLET BY MOUTH TWICE A DAY 180 tablet 3  . pregabalin (LYRICA) 50 MG capsule Take 1 capsule (50 mg total) by mouth daily. 90 capsule 1  . propranolol ER (INDERAL LA) 80 MG 24 hr capsule TAKE ONE CAPSULE BY MOUTH DAILY 90 capsule 0   No facility-administered medications prior to visit.     Per HPI unless specifically indicated in ROS section below Review of Systems Objective:  BP (!) 153/72   Pulse 68   Temp (!) 97.5 F (36.4 C)   Ht 5' (1.524 m)   Wt 126 lb (57.2 kg)  SpO2 94%   BMI 24.61 kg/m   Wt Readings from Last 3 Encounters:  02/02/21 126 lb (57.2 kg)  07/04/20 124 lb 4 oz (56.4 kg)  06/12/20 125 lb (56.7 kg)       Physical exam: Gen: alert, NAD, not ill appearing Pulm: speaks in complete sentences without increased work of breathing Psych: normal mood, normal thought content      Lab Results  Component Value Date   CREATININE 0.84 12/15/2019   BUN 16 12/15/2019   NA 134 (L) 12/15/2019   K 4.0 12/15/2019   CL 100 12/15/2019   CO2 30 12/15/2019  GFR = 66 (12/2019)  Assessment & Plan:   Problem List Items Addressed This Visit    Essential hypertension   COVID-19 virus  infection - Primary    Recommend: Full dose paxlovid. Drug interactions:  1. Alprazolam - reduce dose by 50% while on paxlovid - not taking 2. Lipitor - hold while on paxlovid As no recent renal function, I have asked her to come in this afternoon for curbside lab visit for kidney check.  Discussed anticipated course of recovery as well as red flags to seek urgent in-person care. Reviewed latest CDC isolation/quarantining guidelines.  Encouraged fluids and rest. Reviewed further supportive care measures at home including vit C, vit D, zinc, tylenol PRN.       Relevant Medications   nirmatrelvir/ritonavir EUA (PAXLOVID) TABS   Other Relevant Orders   Basic metabolic panel       Meds ordered this encounter  Medications  . nirmatrelvir/ritonavir EUA (PAXLOVID) TABS    Sig: Take 3 tablets by mouth 2 (two) times daily for 5 days. (Take nirmatrelvir 150 mg two tablets twice daily for 5 days and ritonavir 100 mg one tablet twice daily for 5 days) Patient GFR is 66    Dispense:  30 tablet    Refill:  0   Orders Placed This Encounter  Procedures  . Basic metabolic panel    Standing Status:   Future    Standing Expiration Date:   02/02/2022    I discussed the assessment and treatment plan with the patient. The patient was provided an opportunity to ask questions and all were answered. The patient agreed with the plan and demonstrated an understanding of the instructions. The patient was advised to call back or seek an in-person evaluation if the symptoms worsen or if the condition fails to improve as anticipated.  Follow up plan: No follow-ups on file.  [name], MD

## 2021-02-02 NOTE — Assessment & Plan Note (Addendum)
Recommend: Full dose paxlovid. Drug interactions:  1. Alprazolam - reduce dose by 50% while on paxlovid - not taking 2. Lipitor - hold while on paxlovid As no recent renal function, I have asked her to come in this afternoon for curbside lab visit for kidney check.  Discussed anticipated course of recovery as well as red flags to seek urgent in-person care. Reviewed latest CDC isolation/quarantining guidelines.  Encouraged fluids and rest. Reviewed further supportive care measures at home including vit C, vit D, zinc, tylenol PRN.

## 2021-02-05 ENCOUNTER — Encounter: Payer: Self-pay | Admitting: Family Medicine

## 2021-02-05 NOTE — Telephone Encounter (Signed)
See other mychart message.

## 2021-02-05 NOTE — Telephone Encounter (Signed)
Can we call and triage headaches tomorrow?

## 2021-02-08 ENCOUNTER — Inpatient Hospital Stay
Admission: EM | Admit: 2021-02-08 | Discharge: 2021-02-11 | DRG: 640 | Disposition: A | Discharge status: Home / Self Care | Payer: Medicare Other | Attending: Internal Medicine | Admitting: Internal Medicine

## 2021-02-08 ENCOUNTER — Emergency Department: Payer: Medicare Other

## 2021-02-08 ENCOUNTER — Encounter: Payer: Self-pay | Admitting: Family Medicine

## 2021-02-08 ENCOUNTER — Other Ambulatory Visit: Payer: Self-pay

## 2021-02-08 DIAGNOSIS — R531 Weakness: Secondary | ICD-10-CM | POA: Diagnosis not present

## 2021-02-08 DIAGNOSIS — Z79899 Other long term (current) drug therapy: Secondary | ICD-10-CM

## 2021-02-08 DIAGNOSIS — J9811 Atelectasis: Secondary | ICD-10-CM | POA: Diagnosis present

## 2021-02-08 DIAGNOSIS — G9341 Metabolic encephalopathy: Secondary | ICD-10-CM | POA: Diagnosis present

## 2021-02-08 DIAGNOSIS — B0229 Other postherpetic nervous system involvement: Secondary | ICD-10-CM | POA: Diagnosis not present

## 2021-02-08 DIAGNOSIS — I10 Essential (primary) hypertension: Secondary | ICD-10-CM | POA: Diagnosis not present

## 2021-02-08 DIAGNOSIS — E876 Hypokalemia: Secondary | ICD-10-CM | POA: Diagnosis present

## 2021-02-08 DIAGNOSIS — E861 Hypovolemia: Secondary | ICD-10-CM | POA: Diagnosis present

## 2021-02-08 DIAGNOSIS — M810 Age-related osteoporosis without current pathological fracture: Secondary | ICD-10-CM | POA: Diagnosis present

## 2021-02-08 DIAGNOSIS — R4182 Altered mental status, unspecified: Secondary | ICD-10-CM | POA: Diagnosis not present

## 2021-02-08 DIAGNOSIS — E785 Hyperlipidemia, unspecified: Secondary | ICD-10-CM | POA: Diagnosis present

## 2021-02-08 DIAGNOSIS — F419 Anxiety disorder, unspecified: Secondary | ICD-10-CM | POA: Diagnosis present

## 2021-02-08 DIAGNOSIS — I700 Atherosclerosis of aorta: Secondary | ICD-10-CM | POA: Diagnosis not present

## 2021-02-08 DIAGNOSIS — E871 Hypo-osmolality and hyponatremia: Principal | ICD-10-CM | POA: Diagnosis present

## 2021-02-08 DIAGNOSIS — U071 COVID-19: Secondary | ICD-10-CM | POA: Diagnosis not present

## 2021-02-08 DIAGNOSIS — Z8616 Personal history of COVID-19: Secondary | ICD-10-CM | POA: Diagnosis not present

## 2021-02-08 DIAGNOSIS — G4700 Insomnia, unspecified: Secondary | ICD-10-CM | POA: Diagnosis present

## 2021-02-08 DIAGNOSIS — Z8249 Family history of ischemic heart disease and other diseases of the circulatory system: Secondary | ICD-10-CM

## 2021-02-08 DIAGNOSIS — Z823 Family history of stroke: Secondary | ICD-10-CM

## 2021-02-08 DIAGNOSIS — R262 Difficulty in walking, not elsewhere classified: Secondary | ICD-10-CM | POA: Diagnosis present

## 2021-02-08 LAB — COMPREHENSIVE METABOLIC PANEL
ALT: 16 U/L (ref 0–44)
AST: 32 U/L (ref 15–41)
Albumin: 4.4 g/dL (ref 3.5–5.0)
Alkaline Phosphatase: 69 U/L (ref 38–126)
Anion gap: 11 (ref 5–15)
BUN: 13 mg/dL (ref 8–23)
CO2: 22 mmol/L (ref 22–32)
Calcium: 8.8 mg/dL — ABNORMAL LOW (ref 8.9–10.3)
Chloride: 70 mmol/L — ABNORMAL LOW (ref 98–111)
Creatinine, Ser: 0.5 mg/dL (ref 0.44–1.00)
GFR, Estimated: >60 mL/min (ref >60)
Glucose, Bld: 145 mg/dL — ABNORMAL HIGH (ref 70–99)
Potassium: 2.9 mmol/L — ABNORMAL LOW (ref 3.5–5.1)
Sodium: 103 mmol/L — CL (ref 135–145)
Total Bilirubin: 1.3 mg/dL — ABNORMAL HIGH (ref 0.3–1.2)
Total Protein: 7.9 g/dL (ref 6.5–8.1)

## 2021-02-08 LAB — OSMOLALITY: Osmolality: 221 mOsm/kg — CL (ref 275–295)

## 2021-02-08 LAB — BASIC METABOLIC PANEL
Anion gap: 13 (ref 5–15)
BUN: 13 mg/dL (ref 8–23)
CO2: 23 mmol/L (ref 22–32)
Calcium: 8.9 mg/dL (ref 8.9–10.3)
Chloride: 68 mmol/L — ABNORMAL LOW (ref 98–111)
Creatinine, Ser: 0.66 mg/dL (ref 0.44–1.00)
GFR, Estimated: >60 mL/min (ref >60)
Glucose, Bld: 143 mg/dL — ABNORMAL HIGH (ref 70–99)
Potassium: 2.8 mmol/L — ABNORMAL LOW (ref 3.5–5.1)
Sodium: 104 mmol/L — CL (ref 135–145)

## 2021-02-08 LAB — CBC WITH DIFFERENTIAL/PLATELET
Abs Immature Granulocytes: 0.1 10*3/uL — ABNORMAL HIGH (ref 0.00–0.07)
Basophils Absolute: 0 10*3/uL (ref 0.0–0.1)
Basophils Relative: 0 %
Eosinophils Absolute: 0 10*3/uL (ref 0.0–0.5)
Eosinophils Relative: 0 %
Hemoglobin: 13.9 g/dL (ref 12.0–15.0)
Immature Granulocytes: 1 %
Lymphocytes Relative: 29 %
Lymphs Abs: 2.8 10*3/uL (ref 0.7–4.0)
Monocytes Absolute: 1 10*3/uL (ref 0.1–1.0)
Monocytes Relative: 11 %
Neutro Abs: 5.6 10*3/uL (ref 1.7–7.7)
Neutrophils Relative %: 59 %
Platelets: 310 10*3/uL (ref 150–400)
Smear Review: NORMAL
WBC: 9.5 10*3/uL (ref 4.0–10.5)

## 2021-02-08 LAB — MAGNESIUM: Magnesium: 1.9 mg/dL (ref 1.7–2.4)

## 2021-02-08 MED ORDER — SODIUM CHLORIDE 3 % IV SOLN
INTRAVENOUS | Status: DC
Start: 2021-02-08 — End: 2021-02-09
  Administered 2021-02-08: 75 mL/h via INTRAVENOUS
  Filled 2021-02-08 (×3): qty 500

## 2021-02-08 MED ORDER — POTASSIUM CHLORIDE 10 MEQ/100ML IV SOLN
10.0000 meq | INTRAVENOUS | Status: DC
  Administered 2021-02-08 – 2021-02-09 (×2): 10 meq via INTRAVENOUS
  Filled 2021-02-08: qty 100

## 2021-02-08 NOTE — ED Triage Notes (Addendum)
Pt in with co weakness, unable to communicate properly due to weakness. Was dx with covid on Thursday and is now unable to stand. Pt is hard of hearing and does speak english fluently per husband. Pt not answering questions in triage, mumbling which is abnormal for her per husband. Pt did take oral antibodies for Covid at home.

## 2021-02-08 NOTE — ED Notes (Signed)
Critcal result: Sodium 103

## 2021-02-08 NOTE — Consult Note (Addendum)
MEDICATION RELATED CONSULT NOTE   Pharmacy Consult for Sodium Monitoring Indication: Hyponatremia on 3% NaCl  Patient Measurements: Height: 5\' 1"  (154.9 cm) Weight: 54.4 kg (120 lb) IBW/kg (Calculated) : 47.8  BMP Latest Ref Rng & Units 02/08/2021 02/02/2021 12/15/2019  Glucose 70 - 99 mg/dL 145(H) 127(H) 108(H)  BUN 8 - 23 mg/dL 13 10 16   Creatinine 0.44 - 1.00 mg/dL 0.50 0.75 0.84  BUN/Creat Ratio 6 - 22 (calc) - NOT APPLICABLE -  Sodium 292 - 145 mmol/L 103(LL) 125(L) 134(L)  Potassium 3.5 - 5.1 mmol/L 2.9(L) 3.9 4.0  Chloride 98 - 111 mmol/L 70(L) 92(L) 100  CO2 22 - 32 mmol/L 22 27 30   Calcium 8.9 - 10.3 mg/dL 8.8(L) 9.6 9.8     Medications:  3% NaCl at 75 mL/hr  Assessment: Patient is a 75 y/o F with medical history including HTN, HLD, anxiety, lung nodule, recent history of COVID-19 infection who presented to the ED 6/9 with weakness. Subsequently found to have severe hyponatremia with a Na of 103 (last sodium 125 on 02/02/21). Patient is on HCTZ prior to admission. Patient started on 3% hypertonic saline at 75 mL/hr.   Estimated rate of correction on current rate = ~ 1 mmol/L/hr  Date / Time Na level 3% NaCl rate  6/9 at 1952 103 75 cc / hr    Goal of Therapy:  Ideal rate of correction 6-8 mmol/L/day Will notify provider if Na > 4 mmol/L in 2h or Na > 6 mmol/L over 4 hours  Plan:  --Continue to follow sodium levels as ordered --Sodium q2h x 2 followed by q4h --Patient is also hypokalemic and receiving IV Kcl which will also increase serum potassium  Benita Gutter 02/08/2021,9:18 PM

## 2021-02-08 NOTE — ED Notes (Signed)
Pt noted lying on floor in lobby; pt assisted back into w/c; pt is very HOH and slow to answer questions; denies falling and c/o HA; charge nurse notified and pt taken immed to room 11; husband called to come stay with pt due to fall risk and concern for altered mental status; husband voices understanding of COVID precautions & restrictions; pt assisted into gown & placed on card monitor for EKG; lab calls and reports Na 103; ED tech called to bedside to monitor pt until arrival of husband and will set up seizure pads; Dr Archie Balboa and care nurse Nathaniel Man RN notified of pt's arrival, cc and Na result; acuity level changed; husb reports pt has fallen several times at home, reported severe HA yesterday but "thought it was just the COVID"; also had reports some N/V/D at home

## 2021-02-08 NOTE — ED Notes (Signed)
Seizure pads applied to bed.  

## 2021-02-08 NOTE — ED Provider Notes (Signed)
[facility] Emergency Department Provider Note   ____________________________________________   I have reviewed the triage vital signs and the nursing notes.   HISTORY  Chief Complaint Weakness   History limited by and level 5 caveat due to: Weakness. Most history obtained from husband at bedside   HPI Robin Peck is a 75 y.o. female who presents to the emergency department today because of concerns for worsening weakness.  Husband states that the patient was diagnosed with COVID recently.  Did take course of Paxil of it.  Over the past few days she has been getting progressively weak.  He noticed today that she was having a very hard time even walking.  Patient herself is awake alert and oriented however cannot answer with any substantial information. Husband states patient is normally alert and oriented x 4   Records reviewed. Per medical record review patient has a history of recent COVID diagnosis. Sodium level of 125 6 days ago.   Past Medical History:  Diagnosis Date   Allergic rhinitis    Allergy    seasonal   Anxiety    Arthritis    Heart murmur    Hyperlipidemia    Hypertension     Patient Active Problem List   Diagnosis Date Noted   COVID-19 virus infection 02/02/2021   Cecal polyp 01/15/2020   Osteoporosis 12/20/2019   Medicare annual wellness visit, subsequent 12/20/2019   Advanced care planning/counseling discussion 12/20/2019   Decreased visual acuity 12/20/2019   Chronic insomnia 10/16/2019   Pounding in head 10/16/2019   Tinnitus aurium, right 08/04/2018   PHN (postherpetic neuralgia) 06/23/2017   Multiple thyroid nodules 11/19/2016   Cervical pain (neck) 03/11/2016   Lung nodule seen on imaging study 08/31/2015   Anxiety disorder due to general medical condition with panic attack 06/01/2015   DJD (degenerative joint disease) of cervical spine 02/17/2013   HLD (hyperlipidemia) 11/08/2010   Essential hypertension  11/08/2010   ALLERGIC RHINITIS 11/08/2010    Past Surgical History:  Procedure Laterality Date   ABDOMINAL HYSTERECTOMY     COLONOSCOPY  06/2020   3.5cm lesion removed piecemeal, smaller polyp removed, int hem ([place]our[place])   COLONOSCOPY WITH PROPOFOL N/A 06/12/2020   Procedure: COLONOSCOPY WITH PROPOFOL;  Surgeon: Irving Copas., MD;  Location: [place];  Service: Gastroenterology;  Laterality: N/A;   ENDOSCOPIC MUCOSAL RESECTION N/A 06/12/2020   Procedure: ENDOSCOPIC MUCOSAL RESECTION;  Surgeon: Rush Landmark Telford Nab., MD;  Location: [place];  Service: Gastroenterology;  Laterality: N/A;   HEMOSTASIS CLIP PLACEMENT  06/12/2020   Procedure: HEMOSTASIS CLIP PLACEMENT;  Surgeon: Irving Copas., MD;  Location: [place];  Service: Gastroenterology;;   PARTIAL HYSTERECTOMY  2002   hysterectomy   POLYPECTOMY  06/12/2020   Procedure: POLYPECTOMY;  Surgeon: Rush Landmark Telford Nab., MD;  Location: [place];  Service: Gastroenterology;;   SUBMUCOSAL LIFTING INJECTION  06/12/2020   Procedure: SUBMUCOSAL LIFTING INJECTION;  Surgeon: Irving Copas., MD;  Location: [place];  Service: Gastroenterology;;    Prior to Admission medications   Medication Sig Start Date End Date Taking? Authorizing Provider  ALPRAZolam Duanne Moron) 0.25 MG tablet Take 1 tablet (0.25 mg total) by mouth 2 (two) times daily as needed for anxiety or sleep. 08/14/20   [name], MD  atorvastatin (LIPITOR) 10 MG tablet TAKE ONE TABLET BY MOUTH DAILY 11/22/20   [name], MD  b complex vitamins capsule Take 1 capsule by mouth daily. 12/20/19   [name], MD  Calcium Carb-Cholecalciferol (CALCIUM-VITAMIN  D) 600-400 MG-UNIT TABS Take 1 tablet by mouth daily. 07/04/20   [name], MD  cetirizine (ZYRTEC) 10 MG tablet TAKE 1 TABLET (10 MG TOTAL) BY MOUTH DAILY. Patient taking differently: Take 10 mg by mouth daily as needed (seasonal allergies.). 04/29/14   Lucille Passy, MD  Cholecalciferol (VITAMIN D3) 25 MCG (1000 UT) CAPS Take 1 capsule (1,000 Units total) by mouth daily. 07/04/20   [name], MD  hydrochlorothiazide (HYDRODIURIL) 25 MG tablet TAKE ONE TABLET BY MOUTH DAILY 01/17/21   [name], MD  ibuprofen (ADVIL) 200 MG tablet Take 2 tablets (400 mg total) by mouth daily as needed for mild pain. 07/04/20   [name], MD  KLOR-CON M20 20 MEQ tablet TAKE ONE TABLET BY MOUTH TWICE A DAY 01/15/21   [name], MD  losartan (COZAAR) 50 MG tablet TAKE ONE TABLET BY MOUTH TWICE A DAY 10/09/20   [name], MD  pregabalin (LYRICA) 50 MG capsule Take 1 capsule (50 mg total) by mouth daily. 11/01/20   [name], MD  propranolol ER (INDERAL LA) 80 MG 24 hr capsule TAKE ONE CAPSULE BY MOUTH DAILY 01/17/21   [name], MD  potassium chloride 20 MEQ TBCR Take 10 mEq by mouth 2 (two) times daily. 05/30/15 05/30/15  Palumbo, April, MD    Allergies Valacyclovir hcl, Gabapentin, and Lisinopril  Family History  Problem Relation Age of Onset   Heart attack Brother    Hypertension Brother    Hypertension Father    Hypertension Mother    Stroke Mother    Hypertension Sister    Heart attack Maternal Grandfather    Colon cancer Neg Hx    Stomach cancer Neg Hx    Esophageal cancer Neg Hx    Pancreatic cancer Neg Hx    Liver cancer Neg Hx    Inflammatory bowel disease Neg Hx    Rectal cancer Neg Hx    Colon polyps Neg Hx     Social History Social History   Tobacco Use   Smoking status: Never   Smokeless tobacco: Never  Vaping Use   Vaping Use: Never used  Substance Use Topics   Alcohol use: Never   Drug use: No    Review of Systems Unable to obtain ROS given medical condition.   ____________________________________________   PHYSICAL EXAM:  VITAL SIGNS: ED Triage Vitals [02/08/21 1948]  Enc Vitals Group     BP (!) 182/80     Pulse Rate 61     Resp 20     Temp (!) 97.5 F (36.4 C)     Temp  Source Oral     SpO2 100 %     Weight 120 lb (54.4 kg)     Height 5\' 1"  (1.549 m)     Head Circumference      Peak Flow      Pain Score 0   Constitutional: Awake, alert.  Eyes: Conjunctivae are normal.  ENT      Head: Normocephalic and atraumatic.      Nose: No congestion/rhinnorhea.      Mouth/Throat: Mucous membranes are moist.      Neck: No stridor. Hematological/Lymphatic/Immunilogical: No cervical lymphadenopathy. Cardiovascular: Normal rate, regular rhythm.  No murmurs, rubs, or gallops.  Respiratory: Normal respiratory effort without tachypnea nor retractions. Breath sounds are clear and equal bilaterally. No wheezes/rales/rhonchi. Gastrointestinal: Soft and non tender. No rebound. No guarding.  Genitourinary: Deferred Musculoskeletal: Normal range of motion in all extremities. No lower extremity  edema. Neurologic:  Not able to speak complete sentences. No gross focal neurologic deficits are appreciated.  Diffuse weakness.  Skin:  Skin is warm, dry and intact. No rash noted. Psychiatric: Mood and affect are normal. Speech and behavior are normal. Patient exhibits appropriate insight and judgment.  ____________________________________________    LABS (pertinent positives/negatives)  Mg 1.9 Osmolality 221 CBC wbc 9.5, hgb 13.9, plt 310 CMP na 103, k 2.9, glu 145, cr 0.50  ____________________________________________   EKG  None  ____________________________________________    RADIOLOGY  CT head No acute abnormality  CXR Stable cardiomegaly with bibasilar atelectasis  ____________________________________________   PROCEDURES  Procedures  CRITICAL CARE Performed by: Nance Pear   Total critical care time: 35 minutes  Critical care time was exclusive of separately billable procedures and treating other patients.  Critical care was necessary to treat or prevent imminent or life-threatening deterioration.  Critical care was time spent  personally by me on the following activities: development of treatment plan with patient and/or surrogate as well as nursing, discussions with consultants, evaluation of patient's response to treatment, examination of patient, obtaining history from patient or surrogate, ordering and performing treatments and interventions, ordering and review of laboratory studies, ordering and review of radiographic studies, pulse oximetry and re-evaluation of patient's condition.  ____________________________________________   INITIAL IMPRESSION / ASSESSMENT AND PLAN / ED COURSE  Pertinent labs & imaging results that were available during my care of the patient were reviewed by me and considered in my medical decision making (see chart for details).   Patient presented to the emergency department today brought in by husband because of concerns for altered mental status and weakness.  On exam patient does appear to be oriented although she is clearly quite fatigued and cannot give any significant history.  Patient was recently treated for COVID with Paxil of it.  Blood work here is concerning for significant hyponatremia.  This was repeated and confirmed hyponatremia.  Per chart review it does appear that she started to develop some hyponatremia 6 days ago.  Given significance of hyponatremia and patient's clinical picture she was started on hypertonic saline.  I did discuss the findings with the husband and patient.  Will plan admission to the intensive care unit.  ____________________________________________   FINAL CLINICAL IMPRESSION(S) / ED DIAGNOSES  Final diagnoses:  Weakness  Hyponatremia     Note: This dictation was prepared with Dragon dictation. Any transcriptional errors that result from this process are unintentional     Nance Pear, MD 02/08/21 2333

## 2021-02-09 DIAGNOSIS — E871 Hypo-osmolality and hyponatremia: Secondary | ICD-10-CM | POA: Diagnosis present

## 2021-02-09 DIAGNOSIS — M810 Age-related osteoporosis without current pathological fracture: Secondary | ICD-10-CM | POA: Diagnosis present

## 2021-02-09 DIAGNOSIS — U071 COVID-19: Secondary | ICD-10-CM | POA: Diagnosis not present

## 2021-02-09 DIAGNOSIS — Z8616 Personal history of COVID-19: Secondary | ICD-10-CM | POA: Diagnosis not present

## 2021-02-09 DIAGNOSIS — R262 Difficulty in walking, not elsewhere classified: Secondary | ICD-10-CM | POA: Diagnosis present

## 2021-02-09 DIAGNOSIS — I10 Essential (primary) hypertension: Secondary | ICD-10-CM | POA: Diagnosis not present

## 2021-02-09 DIAGNOSIS — E876 Hypokalemia: Secondary | ICD-10-CM | POA: Diagnosis not present

## 2021-02-09 DIAGNOSIS — G4700 Insomnia, unspecified: Secondary | ICD-10-CM | POA: Diagnosis present

## 2021-02-09 DIAGNOSIS — R531 Weakness: Secondary | ICD-10-CM | POA: Diagnosis not present

## 2021-02-09 DIAGNOSIS — G9341 Metabolic encephalopathy: Secondary | ICD-10-CM | POA: Diagnosis present

## 2021-02-09 DIAGNOSIS — Z823 Family history of stroke: Secondary | ICD-10-CM | POA: Diagnosis not present

## 2021-02-09 DIAGNOSIS — E785 Hyperlipidemia, unspecified: Secondary | ICD-10-CM | POA: Diagnosis present

## 2021-02-09 DIAGNOSIS — E861 Hypovolemia: Secondary | ICD-10-CM | POA: Diagnosis present

## 2021-02-09 DIAGNOSIS — J9811 Atelectasis: Secondary | ICD-10-CM | POA: Diagnosis present

## 2021-02-09 DIAGNOSIS — Z79899 Other long term (current) drug therapy: Secondary | ICD-10-CM | POA: Diagnosis not present

## 2021-02-09 DIAGNOSIS — F419 Anxiety disorder, unspecified: Secondary | ICD-10-CM | POA: Diagnosis present

## 2021-02-09 DIAGNOSIS — B0229 Other postherpetic nervous system involvement: Secondary | ICD-10-CM | POA: Diagnosis present

## 2021-02-09 DIAGNOSIS — Z8249 Family history of ischemic heart disease and other diseases of the circulatory system: Secondary | ICD-10-CM | POA: Diagnosis not present

## 2021-02-09 LAB — CBC
Hemoglobin: 12.6 g/dL (ref 12.0–15.0)
Platelets: 263 10*3/uL (ref 150–400)
WBC: 11.9 10*3/uL — ABNORMAL HIGH (ref 4.0–10.5)

## 2021-02-09 LAB — OSMOLALITY, URINE: Osmolality, Ur: 143 mOsm/kg — ABNORMAL LOW (ref 300–900)

## 2021-02-09 LAB — URINALYSIS, COMPLETE (UACMP) WITH MICROSCOPIC
Bacteria, UA: NONE SEEN
Bilirubin Urine: NEGATIVE
Glucose, UA: NEGATIVE mg/dL
Ketones, ur: 5 mg/dL — AB
Leukocytes,Ua: NEGATIVE
Nitrite: NEGATIVE
Protein, ur: NEGATIVE mg/dL
Specific Gravity, Urine: 1.002 — ABNORMAL LOW (ref 1.005–1.030)
Squamous Epithelial / HPF: NONE SEEN (ref 0–5)
WBC, UA: NONE SEEN WBC/hpf (ref 0–5)
pH: 7 (ref 5.0–8.0)

## 2021-02-09 LAB — BASIC METABOLIC PANEL
Anion gap: 8 (ref 5–15)
BUN: 10 mg/dL (ref 8–23)
CO2: 22 mmol/L (ref 22–32)
Calcium: 8.1 mg/dL — ABNORMAL LOW (ref 8.9–10.3)
Chloride: 89 mmol/L — ABNORMAL LOW (ref 98–111)
Creatinine, Ser: 0.57 mg/dL (ref 0.44–1.00)
GFR, Estimated: >60 mL/min (ref >60)
Glucose, Bld: 95 mg/dL (ref 70–99)
Potassium: 3.1 mmol/L — ABNORMAL LOW (ref 3.5–5.1)
Sodium: 119 mmol/L — CL (ref 135–145)

## 2021-02-09 LAB — MAGNESIUM: Magnesium: 2 mg/dL (ref 1.7–2.4)

## 2021-02-09 LAB — TSH: TSH: 0.419 u[IU]/mL (ref 0.350–4.500)

## 2021-02-09 LAB — SODIUM, URINE, RANDOM: Sodium, Ur: 25 mmol/L

## 2021-02-09 LAB — POTASSIUM
Potassium: 3.1 mmol/L — ABNORMAL LOW (ref 3.5–5.1)
Potassium: 3.1 mmol/L — ABNORMAL LOW (ref 3.5–5.1)

## 2021-02-09 LAB — SODIUM
Sodium: 110 mmol/L — CL (ref 135–145)
Sodium: 119 mmol/L — CL (ref 135–145)

## 2021-02-09 LAB — PHOSPHORUS: Phosphorus: 2 mg/dL — ABNORMAL LOW (ref 2.5–4.6)

## 2021-02-09 MED ORDER — POTASSIUM PHOSPHATES 15 MMOLE/5ML IV SOLN
15.0000 mmol | Freq: Once | INTRAVENOUS | Status: AC
  Administered 2021-02-09: 15 mmol via INTRAVENOUS
  Filled 2021-02-09: qty 5

## 2021-02-09 MED ORDER — ZINC SULFATE 220 (50 ZN) MG PO CAPS
220.0000 mg | ORAL_CAPSULE | Freq: Every day | ORAL | Status: DC
  Administered 2021-02-10 – 2021-02-11 (×2): 220 mg via ORAL
  Filled 2021-02-09 (×2): qty 1

## 2021-02-09 MED ORDER — HEPARIN SODIUM (PORCINE) 5000 UNIT/ML IJ SOLN
5000.0000 [IU] | Freq: Three times a day (TID) | INTRAMUSCULAR | Status: DC
  Administered 2021-02-09 – 2021-02-11 (×6): 5000 [IU] via SUBCUTANEOUS
  Filled 2021-02-09 (×6): qty 1

## 2021-02-09 MED ORDER — SODIUM CHLORIDE 3 % IV SOLN
INTRAVENOUS | Status: DC
  Filled 2021-02-09 (×2): qty 500

## 2021-02-09 MED ORDER — GUAIFENESIN-DM 100-10 MG/5ML PO SYRP: 10.0000 mL | ORAL_SOLUTION | ORAL | Status: DC | PRN

## 2021-02-09 MED ORDER — HYDROCOD POLST-CPM POLST ER 10-8 MG/5ML PO SUER: 5.0000 mL | Freq: Two times a day (BID) | ORAL | Status: DC | PRN

## 2021-02-09 MED ORDER — POTASSIUM CHLORIDE CRYS ER 20 MEQ PO TBCR
40.0000 meq | EXTENDED_RELEASE_TABLET | Freq: Once | ORAL | Status: AC
  Administered 2021-02-09: 40 meq via ORAL
  Filled 2021-02-09: qty 2

## 2021-02-09 MED ORDER — DEXAMETHASONE SODIUM PHOSPHATE 10 MG/ML IJ SOLN
6.0000 mg | INTRAMUSCULAR | Status: DC
  Administered 2021-02-09 – 2021-02-11 (×3): 6 mg via INTRAVENOUS
  Filled 2021-02-09 (×3): qty 1

## 2021-02-09 MED ORDER — ASCORBIC ACID 500 MG PO TABS
500.0000 mg | ORAL_TABLET | Freq: Every day | ORAL | Status: DC
  Administered 2021-02-10 – 2021-02-11 (×2): 500 mg via ORAL
  Filled 2021-02-09 (×2): qty 1

## 2021-02-09 MED ORDER — POLYETHYLENE GLYCOL 3350 17 G PO PACK: 17.0000 g | PACK | Freq: Every day | ORAL | Status: DC | PRN

## 2021-02-09 MED ORDER — DOCUSATE SODIUM 100 MG PO CAPS: 100.0000 mg | ORAL_CAPSULE | Freq: Two times a day (BID) | ORAL | Status: DC | PRN

## 2021-02-09 MED ORDER — POTASSIUM CHLORIDE 10 MEQ/100ML IV SOLN
10.0000 meq | INTRAVENOUS | Status: AC
  Administered 2021-02-09 (×6): 10 meq via INTRAVENOUS
  Filled 2021-02-09 (×4): qty 100

## 2021-02-09 MED ORDER — ALBUTEROL SULFATE HFA 108 (90 BASE) MCG/ACT IN AERS
2.0000 | INHALATION_SPRAY | Freq: Four times a day (QID) | RESPIRATORY_TRACT | Status: DC
  Administered 2021-02-10 – 2021-02-11 (×5): 2 via RESPIRATORY_TRACT
  Filled 2021-02-09 (×2): qty 6.7

## 2021-02-09 MED ORDER — POTASSIUM CHLORIDE 10 MEQ/100ML IV SOLN
10.0000 meq | INTRAVENOUS | Status: AC
  Administered 2021-02-09: 10 meq via INTRAVENOUS
  Filled 2021-02-09 (×3): qty 100

## 2021-02-09 NOTE — Progress Notes (Signed)
Patient accepted from intensivist.  TR[facility] will assume attending role on 6/11 at 7 AM.  patient admitted by ICU team for significant hyponatremia possibly related to Paxlovid in the setting of COVID-19 infection.  Hyponatremia has responded to 3% saline which has now been discontinued.

## 2021-02-09 NOTE — Telephone Encounter (Signed)
Please call to triage patient.

## 2021-02-09 NOTE — Telephone Encounter (Signed)
A user error has taken place: encounter opened in error, closed for administrative reasons.

## 2021-02-09 NOTE — ED Notes (Addendum)
Critical lab called to this RN. Na+ 119 and K+ 3.1. MD Patsey Berthold.

## 2021-02-09 NOTE — ED Notes (Signed)
Husband at bedside.  

## 2021-02-09 NOTE — ED Notes (Signed)
Pt given meal tray and drink. Family remains at bedside

## 2021-02-09 NOTE — ED Notes (Addendum)
Pt assisted to the bathroom. Pt more alert at this time and able to answer questions appropriately

## 2021-02-09 NOTE — Telephone Encounter (Signed)
Records reviewed.  Spoke with husband who is currently at hospital with patient.  Plan slow sodium replacement.  Now off hctz.

## 2021-02-09 NOTE — H&P (Addendum)
NAME:  Robin Peck, MRN:  [id number], DOB:  March 04, 1946, LOS: 0 ADMISSION DATE:  02/08/2021, CONSULTATION DATE:  02/09/2021 REFERRING MD: Nance Pear MD CHIEF COMPLAINT: Altered Mental Status, Weakness   History of present illness   75 year old female with significant past medical history as below presenting to the ED with chief complaints of altered mental status and generalized weakness.  Patient unable to provide reliable history so history mostly obtained from patient's chart.  Per ED notes, patient was recently diagnosed with COVID on 02/01/2021 and started on nirmatrelvir/ritonavir EUA (PAXLOVID) on 02/02/2021. Since her diagnosis, she has been getting progressively weak and unable to walk or stand per husband.  Reports of nausea, vomiting, diarrhea, abdominal pain, shortness of breath, fevers or chills, or chest pain.   ED Course: On arrival to the ED, she was afebrile with blood pressure (!) 182/80 mm Hg and pulse rate 61 beats/min., RR 20, Sats 100% on RA. There were no focal neurological deficits; She was lethargic, oriented to self but unable to follow commands consistently.  Per patient's husband she is usually alert and oriented at baseline.  Labs revealed a sodium 103, potassium 2.9, glucose 145.  Noncontrast CT head was obtained and showed no acute intracranial abnormality.  Chest x-ray stable cardiomegaly with bibasilar atelectasis.  Given significance of hyponatremia and patient's clinical picture she was started on hypertonic saline. PCCM consulted for management.  Past Medical History  Allergic rhinitis Anxiety Hyperlipidemia Hypertension Heart murmur Arthritis Post herpetic neuralgia  Significant Hospital Events   6/9:Admitted to ICU with severe hyponatremia  Consults:  Nephrology  Procedures:  None  Significant Diagnostic Tests:  6/9: Chest Xray>stable cardiomegaly with bibasilar atelectasis. 6/9: Noncontrast CT head> no acute intracranial abnormality  Micro  Data:  02/01/2021: SARS-CoV-2 PCR>> POSITIVE  Antimicrobials:  None  OBJECTIVE  Blood pressure (!) 114/92, pulse 77, temperature (!) 97.5 F (36.4 C), temperature source Oral, resp. rate (!) 21, height 5\' 1"  (1.549 m), weight 54.4 kg, SpO2 99 %.        Intake/Output Summary (Last 24 hours) at 02/09/2021 0412 Last data filed at 02/09/2021 0214 Gross per 24 hour  Intake 423.92 ml  Output --  Net 423.92 ml   Filed Weights   02/08/21 1948  Weight: 54.4 kg    Physical Examination  GENERAL: 75 year-old critically ill patient lying in the bed with no acute distress.  EYES: Pupils equal, round, reactive to light and accommodation. No scleral icterus. Extraocular muscles intact.  HEENT: Head atraumatic, normocephalic. Oropharynx and nasopharynx clear.  NECK:  Supple, no jugular venous distention. No thyroid enlargement, no tenderness.  LUNGS: Normal breath sounds bilaterally, no wheezing, rales,rhonchi or crepitation. No use of accessory muscles of respiration.  CARDIOVASCULAR: S1, S2 normal. Murmur present.  rubs, or gallops.  ABDOMEN: Soft, nontender, nondistended. Bowel sounds present. No organomegaly or mass.  EXTREMITIES: No pedal edema, cyanosis, or clubbing.  NEUROLOGIC: Cranial nerves II through XII are intact.  Muscle strength 5/5 in all extremities. Sensation intact. Gait not checked.  PSYCHIATRIC: The patient is alert and oriented x 1.  SKIN: No obvious rash, lesion, or ulcer.   Labs/imaging that I havepersonally reviewed  (right click and "Reselect all SmartList Selections" daily)     Labs   CBC: Recent Labs  Lab 02/08/21 1952  WBC 9.5  NEUTROABS 5.6  HGB 13.9  HCT RESULTS UNAVAILABLE DUE TO INTERFERING SUBSTANCE  MCV RESULTS UNAVAILABLE DUE TO INTERFERING SUBSTANCE  PLT 295    Basic Metabolic  Panel: Recent Labs  Lab 02/02/21 1441 02/08/21 1952 02/08/21 2109 02/09/21 0036  NA 125* 103* 104* 110*  K 3.9 2.9* 2.8*  --   CL 92* 70* 68*  --   CO2 27 22 23    --   GLUCOSE 127* 145* 143*  --   BUN 10 13 13   --   CREATININE 0.75 0.50 0.66  --   CALCIUM 9.6 8.8* 8.9  --   MG  --   --  1.9  --    GFR: Estimated Creatinine Clearance: 45.8 mL/min (by C-G formula based on SCr of 0.66 mg/dL). Recent Labs  Lab 02/08/21 1952  WBC 9.5    Liver Function Tests: Recent Labs  Lab 02/08/21 1952  AST 32  ALT 16  ALKPHOS 69  BILITOT 1.3*  PROT 7.9  ALBUMIN 4.4   No results for input(s): LIPASE, AMYLASE in the last 168 hours. No results for input(s): AMMONIA in the last 168 hours.  ABG    Component Value Date/Time   TCO2 26 05/30/2015 0058     Coagulation Profile: No results for input(s): INR, PROTIME in the last 168 hours.  Cardiac Enzymes: No results for input(s): CKTOTAL, CKMB, CKMBINDEX, TROPONINI in the last 168 hours.  HbA1C: Hgb A1c MFr Bld  Date/Time Value Ref Range Status  12/15/2019 08:23 AM 6.1 4.6 - 6.5 % Final    Comment:    Glycemic Control Guidelines for People with Diabetes:Non Diabetic:  <6%Goal of Therapy: <7%Additional Action Suggested:  >8%     CBG: No results for input(s): GLUCAP in the last 168 hours.  Review of Systems:   UNABLE TO ASSESS DUE TO ALTERED MENTAL STATUS  Past Medical History  She,  has a past medical history of Allergic rhinitis, Allergy, Anxiety, Arthritis, Heart murmur, Hyperlipidemia, and Hypertension.   Surgical History    Past Surgical History:  Procedure Laterality Date   ABDOMINAL HYSTERECTOMY     COLONOSCOPY  06/2020   3.5cm lesion removed piecemeal, smaller polyp removed, int hem ([place]ouraty)   COLONOSCOPY WITH PROPOFOL N/A 06/12/2020   Procedure: COLONOSCOPY WITH PROPOFOL;  Surgeon: Rush Landmark Telford Nab., MD;  Location: [place];  Service: Gastroenterology;  Laterality: N/A;   ENDOSCOPIC MUCOSAL RESECTION N/A 06/12/2020   Procedure: ENDOSCOPIC MUCOSAL RESECTION;  Surgeon: Rush Landmark Telford Nab., MD;  Location: [place];  Service: Gastroenterology;  Laterality: N/A;    HEMOSTASIS CLIP PLACEMENT  06/12/2020   Procedure: HEMOSTASIS CLIP PLACEMENT;  Surgeon: Irving Copas., MD;  Location: [place];  Service: Gastroenterology;;   PARTIAL HYSTERECTOMY  2002   hysterectomy   POLYPECTOMY  06/12/2020   Procedure: POLYPECTOMY;  Surgeon: Irving Copas., MD;  Location: [place];  Service: Gastroenterology;;   SUBMUCOSAL LIFTING INJECTION  06/12/2020   Procedure: SUBMUCOSAL LIFTING INJECTION;  Surgeon: Irving Copas., MD;  Location: [place];  Service: Gastroenterology;;     Social History   reports that she has never smoked. She has never used smokeless tobacco. She reports that she does not drink alcohol and does not use drugs.   Family History   Her family history includes Heart attack in her brother and maternal grandfather; Hypertension in her brother, father, mother, and sister; Stroke in her mother. There is no history of Colon cancer, Stomach cancer, Esophageal cancer, Pancreatic cancer, Liver cancer, Inflammatory bowel disease, Rectal cancer, or Colon polyps.   Allergies Allergies  Allergen Reactions   Valacyclovir Hcl Other (See Comments) and Anaphylaxis    Possible cause of rash,  was concurrently on gabapentin Possible cause of rash, was concurrently on gabapentin   Gabapentin Rash    Possibly caused rash, was concurrently on valtrex.    Lisinopril Cough     Home Medications  Prior to Admission medications   Medication Sig Start Date End Date Taking? Authorizing Provider  ALPRAZolam Duanne Moron) 0.25 MG tablet Take 1 tablet (0.25 mg total) by mouth 2 (two) times daily as needed for anxiety or sleep. 08/14/20   [name], MD  atorvastatin (LIPITOR) 10 MG tablet TAKE ONE TABLET BY MOUTH DAILY 11/22/20   [name], MD  b complex vitamins capsule Take 1 capsule by mouth daily. 12/20/19   [name], MD  Calcium Carb-Cholecalciferol (CALCIUM-VITAMIN D) 600-400 MG-UNIT TABS Take 1 tablet by mouth  daily. 07/04/20   [name], MD  cetirizine (ZYRTEC) 10 MG tablet TAKE 1 TABLET (10 MG TOTAL) BY MOUTH DAILY. Patient taking differently: Take 10 mg by mouth daily as needed (seasonal allergies.). 04/29/14   Lucille Passy, MD  Cholecalciferol (VITAMIN D3) 25 MCG (1000 UT) CAPS Take 1 capsule (1,000 Units total) by mouth daily. 07/04/20   [name], MD  hydrochlorothiazide (HYDRODIURIL) 25 MG tablet TAKE ONE TABLET BY MOUTH DAILY 01/17/21   [name], MD  ibuprofen (ADVIL) 200 MG tablet Take 2 tablets (400 mg total) by mouth daily as needed for mild pain. 07/04/20   [name], MD  KLOR-CON M20 20 MEQ tablet TAKE ONE TABLET BY MOUTH TWICE A DAY 01/15/21   [name], MD  losartan (COZAAR) 50 MG tablet TAKE ONE TABLET BY MOUTH TWICE A DAY 10/09/20   [name], MD  pregabalin (LYRICA) 50 MG capsule Take 1 capsule (50 mg total) by mouth daily. 11/01/20   [name], MD  propranolol ER (INDERAL LA) 80 MG 24 hr capsule TAKE ONE CAPSULE BY MOUTH DAILY 01/17/21   [name], MD  potassium chloride 20 MEQ TBCR Take 10 mEq by mouth 2 (two) times daily. 05/30/15 05/30/15  Palumbo, April, MD  Scheduled Meds: Continuous Infusions:  potassium chloride Stopped (02/09/21 0356)   sodium chloride (hypertonic) 75 mL/hr at 02/09/21 0039   PRN Meds:.docusate sodium, polyethylene glycol   Active Hospital Problem list   Severe hypotonic hyponatremia Acute metabolic encephalopathy COVID infection Hypertension  Assessment & Plan:  Severe Hypotonic Hyponatremia  Likely Hypovolemic in the setting of poor intake Osmolality 221 Urine Sodium and Urine Osmolality pending -Monitor I&O's / urinary output -Follow BMP -Ensure adequate renal perfusion -Avoid nephrotoxic agents as able -Replace electrolytes as indicated -Continue 3% Hypertonic Saline Goal rate of Na+ correction is 4-6 mEq in 1st 24 hours (max rate of 10 mEq)  -Follow Na q4h while on Hypertonic  saline; -Serum and urine Osmolality, and urine Na+ pending -Check TSH, Cortisol -Nephrology consulted   Acute Metabolic Encephalopathy in the setting of Severe Hyponatremia  -Treat hyponatremia as above -CT Head negative -Seizure precautions -Neuro checks -Provide supportive care  COVID-19 infection  -Supplemental O2 as needed to maintain O2 saturations >92% -Follow intermittent ABG and chest x-ray as needed -As needed bronchodilators -Hold Paxlovid for now -Start Decadron 6 mg Q 24 hours -Inhalers with flutter valve -Daily CRP, CMP, CBC, D-dimer -Vitamins (Zinc and Vitamin C) -Continue airborne -Heparin prophylactic dose.   Hypertension Home meds: Losartan, chlorothiazide Continue home meds as BP allows   Best practice:  Diet:  Oral Pain/Anxiety/Delirium protocol (if indicated): No VAP protocol (if indicated): Not indicated DVT prophylaxis: Subcutaneous Heparin GI prophylaxis: PPI Glucose control:  SSI No Central venous access:  N/A Arterial line:  N/A Foley:  N/A Mobility:  bed rest  PT consulted: N/A Last date of multidisciplinary goals of care discussion [6/9] Code Status:  full code Disposition: ICU  Critical care time: 45 mins     Rufina Falco, DNP, CCRN, FNP-C, AGACNP-BC Acute Care Nurse Practitioner  [facility] Pager: [phone] [place] [facility]  .

## 2021-02-09 NOTE — Telephone Encounter (Signed)
Just getting this message.

## 2021-02-09 NOTE — Progress Notes (Signed)
PHARMACY CONSULT NOTE - FOLLOW UP  Pharmacy Consult for Electrolyte Monitoring and Replacement   Recent Labs: Potassium (mmol/L)  Date Value  02/08/2021 2.8 (L)   Magnesium (mg/dL)  Date Value  02/08/2021 1.9   Calcium (mg/dL)  Date Value  02/08/2021 8.9   Albumin (g/dL)  Date Value  02/08/2021 4.4   Sodium (mmol/L)  Date Value  02/08/2021 104 (LL)     Assessment: 6/9:  K @ 2109 = 2.8  Goal of Therapy:  Electrolytes WNL   Plan:  KCl 10 mEq IV X 6 ordered for 6/10 @ 0000. Will recheck electrolytes on 6/10 with AM labs.   Orene Desanctis ,PharmD Clinical Pharmacist 02/09/2021 12:14 AM

## 2021-02-09 NOTE — Consult Note (Signed)
MEDICATION RELATED CONSULT NOTE   Pharmacy Consult for Sodium Monitoring Indication: Hyponatremia on 3% NaCl  Patient Measurements: Height: 5\' 1"  (154.9 cm) Weight: 54.4 kg (120 lb) IBW/kg (Calculated) : 47.8  BMP Latest Ref Rng & Units 02/09/2021 02/09/2021 02/09/2021  Glucose 70 - 99 mg/dL - 95 -  BUN 8 - 23 mg/dL - 10 -  Creatinine 0.44 - 1.00 mg/dL - 0.57 -  BUN/Creat Ratio 6 - 22 (calc) - - -  Sodium 135 - 145 mmol/L 119(LL) 119(LL) -  Potassium 3.5 - 5.1 mmol/L 3.1(L) 3.1(L) 3.1(L)  Chloride 98 - 111 mmol/L - 89(L) -  CO2 22 - 32 mmol/L - 22 -  Calcium 8.9 - 10.3 mg/dL - 8.1(L) -     Medications:  3% NaCl at 75 mL/hr  Assessment: Patient is a 75 y/o F with medical history including HTN, HLD, anxiety, lung nodule, recent history of COVID-19 infection who presented to the ED 6/9 with weakness. Subsequently found to have severe hyponatremia with a Na of 103 (last sodium 125 on 02/02/21). Patient is on HCTZ prior to admission. Patient started on 3% hypertonic saline at 75 mL/hr.   Estimated rate of correction on current rate = ~ 1 mmol/L/hr  Date / Time Na level 3% NaCl rate  6/9 at 1952 6/10 at 0036  103 110 75 cc / hr 75 cc / hr    3% NaCl is now off.  6/10 05:36 Na 119 6/10 19:21 Na 119  Goal of Therapy:  Goal for correction by 6/11 AM is 120-122 per nephrology notes  Plan:  Will continue to follow q4h Na levels.  Paulina Fusi, PharmD, BCPS 02/09/2021 10:19 PM

## 2021-02-09 NOTE — Consult Note (Signed)
MEDICATION RELATED CONSULT NOTE   Pharmacy Consult for Sodium Monitoring Indication: Hyponatremia on 3% NaCl  Patient Measurements: Height: 5\' 1"  (154.9 cm) Weight: 54.4 kg (120 lb) IBW/kg (Calculated) : 47.8  BMP Latest Ref Rng & Units 02/09/2021 02/08/2021 02/08/2021  Glucose 70 - 99 mg/dL - 143(H) 145(H)  BUN 8 - 23 mg/dL - 13 13  Creatinine 0.44 - 1.00 mg/dL - 0.66 0.50  BUN/Creat Ratio 6 - 22 (calc) - - -  Sodium 135 - 145 mmol/L 110(LL) 104(LL) 103(LL)  Potassium 3.5 - 5.1 mmol/L - 2.8(L) 2.9(L)  Chloride 98 - 111 mmol/L - 68(L) 70(L)  CO2 22 - 32 mmol/L - 23 22  Calcium 8.9 - 10.3 mg/dL - 8.9 8.8(L)     Medications:  3% NaCl at 75 mL/hr  Assessment: Patient is a 75 y/o F with medical history including HTN, HLD, anxiety, lung nodule, recent history of COVID-19 infection who presented to the ED 6/9 with weakness. Subsequently found to have severe hyponatremia with a Na of 103 (last sodium 125 on 02/02/21). Patient is on HCTZ prior to admission. Patient started on 3% hypertonic saline at 75 mL/hr.   Estimated rate of correction on current rate = ~ 1 mmol/L/hr  Date / Time Na level 3% NaCl rate  6/9 at 1952 6/10 at 0036 103 110 75 cc / hr 75 cc / hr     Goal of Therapy:  Ideal rate of correction 6-8 mmol/L/day Will notify provider if Na > 4 mmol/L in 2h or Na > 6 mmol/L over 4 hours  Plan:  --Continue to follow sodium levels as ordered --Sodium q2h x 2 followed by q4h --Patient is also hypokalemic and receiving IV Kcl which will also increase serum potassium  6/10:  Na @ 0036 = 110, reflects an increase of 7 mmol in ~ 4 hrs. Rate decreased to 50 cc/hr. Will recheck Na level @ 0400.   Robin Peck D 02/09/2021,3:22 AM

## 2021-02-09 NOTE — Consult Note (Signed)
[facility] Consult Note: 02/09/2021    Date of Admission:  02/08/2021           Reason for Consult:     Referring Provider: Tyler Pita, MD Primary Care Provider: [name], MD   History of Presenting Illness:  Robin Peck is a 75 y.o. female who presented to the emergency room due to severe weakness.  Information is obtained from the patient chart as well as from patient's husband.  He states that she was diagnosed with COVID last Thursday.  Received antiviral pack Slo-Bid.  Was doing fair but not eating well.  Was able to drink water and liquids.  For the last 2 days she was confused and disoriented.  She also became very weak therefore patient's husband brought her to the emergency room for evaluation  In the emergency room on June 9, her sodium was noted to be extremely low at 130 This morning sodium is 119 after hypertonic saline administration Nephrology consult has now been requested for further evaluation Patient's husband report that she has had trouble with low potassium off and on for a while but not breath sodium. Review of previous results show that in 2019, sodium was normal at 139 In 2021, sodium was 134 On February 02, 2021, outpatient sodium was 125 Urinalysis in the emergency room is negative for protein, 0-5 RBCs, specific gravity of 1.002  Review of Systems: ROS Gen: No fevers or chills at present HEENT: No vision or problems.  Patient is hard of hearing CV: No chest pain or shortness of breath Resp: Has had some cough off and on GI: Significant nausea, vomiting and diarrhea-prior to admission.  None since she has been in the ER.  No blood in the stool GU : No problems with voiding.  No hematuria.  No previous history of kidney problems MS: Ambulatory at baseline.  Denies any acute joint pain or swelling Derm:   No complaints Psych: No complaints Heme: No complaints Neuro: No complaints Endocrine: No complaints   Past  Medical History:  Diagnosis Date   Allergic rhinitis    Allergy    seasonal   Anxiety    Arthritis    Heart murmur    Hyperlipidemia    Hypertension     Social History   Tobacco Use   Smoking status: Never   Smokeless tobacco: Never  Vaping Use   Vaping Use: Never used  Substance Use Topics   Alcohol use: Never   Drug use: No    Family History  Problem Relation Age of Onset   Heart attack Brother    Hypertension Brother    Hypertension Father    Hypertension Mother    Stroke Mother    Hypertension Sister    Heart attack Maternal Grandfather    Colon cancer Neg Hx    Stomach cancer Neg Hx    Esophageal cancer Neg Hx    Pancreatic cancer Neg Hx    Liver cancer Neg Hx    Inflammatory bowel disease Neg Hx    Rectal cancer Neg Hx    Colon polyps Neg Hx      OBJECTIVE: Blood pressure 136/63, pulse 67, temperature (!) 97.5 F (36.4 C), temperature source Oral, resp. rate 14, height 5\' 1"  (1.549 m), weight 54.4 kg, SpO2 97 %.  Physical Exam  Physical Exam: General:  No acute distress, laying in the bed  HEENT  anicteric, moist oral mucous membrane  Pulm/lungs  normal breathing effort,  CVS/Heart  irregular rhythm, no rub or gallop  Abdomen:   Soft, mildly tender  Extremities:  No peripheral edema  Neurologic: Lethargic, arousable but able to follow commands  Skin:  No acute rashes     Lab Results Lab Results  Component Value Date   WBC 11.9 (H) 02/09/2021   HGB 12.6 02/09/2021   HCT RESULTS UNAVAILABLE DUE TO INTERFERING SUBSTANCE 02/09/2021   MCV RESULTS UNAVAILABLE DUE TO INTERFERING SUBSTANCE 02/09/2021   PLT 263 02/09/2021    Lab Results  Component Value Date   CREATININE 0.57 02/09/2021   BUN 10 02/09/2021   NA 119 (LL) 02/09/2021   K 3.1 (L) 02/09/2021   K 3.1 (L) 02/09/2021   CL 89 (L) 02/09/2021   CO2 22 02/09/2021    Lab Results  Component Value Date   ALT 16 02/08/2021   AST 32 02/08/2021   ALKPHOS 69 02/08/2021   BILITOT 1.3  (H) 02/08/2021     Microbiology: No results found for this or any previous visit (from the past 240 hour(s)).  Medications: Scheduled Meds:  albuterol  2 puff Inhalation Q6H   vitamin C  500 mg Oral Daily   dexamethasone (DECADRON) injection  6 mg Intravenous Q24H   heparin injection (subcutaneous)  5,000 Units Subcutaneous Q8H   zinc sulfate  220 mg Oral Daily   Continuous Infusions:  potassium chloride 10 mEq (02/09/21 0905)   potassium PHOSPHATE IVPB (in mmol)     PRN Meds:.chlorpheniramine-HYDROcodone, docusate sodium, guaiFENesin-dextromethorphan, polyethylene glycol  Allergies  Allergen Reactions   Valacyclovir Hcl Other (See Comments) and Anaphylaxis    Possible cause of rash, was concurrently on gabapentin Possible cause of rash, was concurrently on gabapentin   Gabapentin Rash    Possibly caused rash, was concurrently on valtrex.    Lisinopril Cough    Urinalysis: Recent Labs    02/09/21 0600  COLORURINE STRAW*  LABSPEC 1.002*  PHURINE 7.0  GLUCOSEU NEGATIVE  HGBUR SMALL*  BILIRUBINUR NEGATIVE  KETONESUR 5*  PROTEINUR NEGATIVE  NITRITE NEGATIVE  LEUKOCYTESUR NEGATIVE      Imaging: DG Chest 2 View  Result Date: 02/08/2021 CLINICAL DATA:  Recent COVID diagnosis with weakness. EXAM: CHEST - 2 VIEW COMPARISON:  June 12, 2021 FINDINGS: Chronic appearing increased interstitial lung markings are seen with mild areas of atelectasis and/or early infiltrate noted within the bilateral lung bases. There is no evidence of a pleural effusion or pneumothorax. The cardiac silhouette is mildly enlarged. Moderate severity calcification of the aortic arch is noted. Degenerative changes are seen throughout the thoracic spine. IMPRESSION: Stable cardiomegaly with mild bibasilar atelectasis and/or early infiltrate. Electronically Signed   By: Virgina Norfolk M.D.   On: 02/08/2021 20:23   CT Head Wo Contrast  Result Date: 02/08/2021 CLINICAL DATA:  Weakness and altered  mental status. EXAM: CT HEAD WITHOUT CONTRAST TECHNIQUE: Contiguous axial images were obtained from the base of the skull through the vertex without intravenous contrast. COMPARISON:  MR head, dated July 31, 2014. FINDINGS: Brain: There is mild cerebral atrophy with widening of the extra-axial spaces and ventricular dilatation. There are areas of decreased attenuation within the white matter tracts of the supratentorial brain, consistent with microvascular disease changes. Vascular: No hyperdense vessel or unexpected calcification. Skull: Normal. Negative for fracture or focal lesion. Sinuses/Orbits: No acute finding. Other: Mild right posterior parietal scalp soft tissue swelling is seen. IMPRESSION: 1. No acute intracranial abnormality. Electronically Signed   By: Virgina Norfolk M.D.   On: 02/08/2021  21:34      Assessment/Plan:  Robin Peck is a 75 y.o. female with medical problems of hyperlipidemia, hypertension, osteoporosis, postherpetic neuralgia  was admitted on 02/08/2021 for :  Hyponatremia [E87.1]  #Severe hyponatremia Acute hyponatremia likely secondary to volume depletion Risk factors include recent COVID infection, poor oral intake, hydrochlorothiazide U/a suggest dilute urine with specific gravity of 1.002  Serum sodium is expected to correct with IV hydration. Currently getting LR at 100 cc/hr Continue to monitor closely to monitor the rate of correction Will obtain TSH,  a.m. cortisol, urine osmolality have been ordered and are pending D/c HCTZ as outpatient  #COVID-19 infection Mostly GI symptoms of nausea, vomiting, diarrhea Management as per primary team Patient took outpatient treatment with Paxlovid Recommend average rate of correction of about 8-10 meq/d Goal for correction by tomorrow am approx 120-122  Jeanette Rauth 02/09/21

## 2021-02-10 DIAGNOSIS — I10 Essential (primary) hypertension: Secondary | ICD-10-CM

## 2021-02-10 DIAGNOSIS — E871 Hypo-osmolality and hyponatremia: Principal | ICD-10-CM

## 2021-02-10 DIAGNOSIS — U071 COVID-19: Secondary | ICD-10-CM

## 2021-02-10 LAB — CBC WITH DIFFERENTIAL/PLATELET
Abs Immature Granulocytes: 0.05 10*3/uL (ref 0.00–0.07)
Basophils Absolute: 0 10*3/uL (ref 0.0–0.1)
Basophils Relative: 0 %
Eosinophils Absolute: 0 10*3/uL (ref 0.0–0.5)
Eosinophils Relative: 0 %
Hemoglobin: 12.1 g/dL (ref 12.0–15.0)
Immature Granulocytes: 1 %
Lymphocytes Relative: 16 %
Lymphs Abs: 1.5 10*3/uL (ref 0.7–4.0)
Monocytes Absolute: 0.5 10*3/uL (ref 0.1–1.0)
Monocytes Relative: 5 %
Neutro Abs: 7.6 10*3/uL (ref 1.7–7.7)
Neutrophils Relative %: 78 %
Platelets: 286 10*3/uL (ref 150–400)
Smear Review: NORMAL
WBC: 9.6 10*3/uL (ref 4.0–10.5)
nRBC: 0 % (ref 0.0–0.2)

## 2021-02-10 LAB — COMPREHENSIVE METABOLIC PANEL
ALT: 17 U/L (ref 0–44)
AST: 19 U/L (ref 15–41)
Albumin: 3.5 g/dL (ref 3.5–5.0)
Alkaline Phosphatase: 61 U/L (ref 38–126)
Anion gap: 5 (ref 5–15)
BUN: 12 mg/dL (ref 8–23)
CO2: 22 mmol/L (ref 22–32)
Calcium: 8.4 mg/dL — ABNORMAL LOW (ref 8.9–10.3)
Chloride: 97 mmol/L — ABNORMAL LOW (ref 98–111)
Creatinine, Ser: 0.56 mg/dL (ref 0.44–1.00)
GFR, Estimated: >60 mL/min (ref >60)
Glucose, Bld: 140 mg/dL — ABNORMAL HIGH (ref 70–99)
Potassium: 3.1 mmol/L — ABNORMAL LOW (ref 3.5–5.1)
Sodium: 124 mmol/L — ABNORMAL LOW (ref 135–145)
Total Bilirubin: 0.7 mg/dL (ref 0.3–1.2)
Total Protein: 6.6 g/dL (ref 6.5–8.1)

## 2021-02-10 LAB — MAGNESIUM: Magnesium: 2.2 mg/dL (ref 1.7–2.4)

## 2021-02-10 LAB — FERRITIN: Ferritin: 341 ng/mL — ABNORMAL HIGH (ref 11–307)

## 2021-02-10 LAB — PHOSPHORUS: Phosphorus: 2 mg/dL — ABNORMAL LOW (ref 2.5–4.6)

## 2021-02-10 LAB — TSH: TSH: 0.139 u[IU]/mL — ABNORMAL LOW (ref 0.350–4.500)

## 2021-02-10 LAB — D-DIMER, QUANTITATIVE: D-Dimer, Quant: 2.55 ug/mL-FEU — ABNORMAL HIGH (ref 0.00–0.50)

## 2021-02-10 LAB — CORTISOL-AM, BLOOD: Cortisol - AM: 3.5 ug/dL — ABNORMAL LOW (ref 6.7–22.6)

## 2021-02-10 LAB — C-REACTIVE PROTEIN: CRP: <0.5 mg/dL (ref <1.0)

## 2021-02-10 MED ORDER — POTASSIUM CHLORIDE CRYS ER 20 MEQ PO TBCR
40.0000 meq | EXTENDED_RELEASE_TABLET | Freq: Three times a day (TID) | ORAL | Status: AC
  Administered 2021-02-10 (×3): 40 meq via ORAL
  Filled 2021-02-10 (×3): qty 2

## 2021-02-10 MED ORDER — K PHOS MONO-SOD PHOS DI & MONO 155-852-130 MG PO TABS
500.0000 mg | ORAL_TABLET | ORAL | Status: AC
Start: 2021-02-10 — End: 2021-02-10
  Administered 2021-02-10 (×4): 500 mg via ORAL
  Filled 2021-02-10 (×5): qty 2

## 2021-02-10 MED ORDER — SODIUM CHLORIDE 0.9 % IV SOLN: INTRAVENOUS | Status: DC

## 2021-02-10 NOTE — ED Notes (Signed)
Pt beverage per request at this time. Pt in NAD, denies further needs at this time.

## 2021-02-10 NOTE — ED Notes (Signed)
Pt able to slide from stretcher to hospital bed. Pt transported on hospital bed to [id number]. Seizure pads placed on hospital bed. Family remains at pt bedside. Denies needs at this time.

## 2021-02-10 NOTE — Progress Notes (Signed)
[facility]  ROUNDING NOTE   Subjective:  Robin Peck is a 75 y.o. female who presented to the emergency room due to severe generalized weakness and  confusion .She was diagnosed with Covid 19 recently, appetite was poor and was not eating well. In the emergency room on June 9, her sodium was noted to be extremely low at 103,treated with hypertonic Saline and Nephrology was consulted. This morning sodium is 124.Patient is awake, answering to simple questions even though conversation limited due to hard of hearing. Her husband is at the bedside.  Objective:  Vital signs in last 24 hours:  Pulse Rate:  [58-94] 79 (06/11 1242) Resp:  [15-20] 16 (06/11 1242) BP: (124-186)/(54-90) 172/72 (06/11 1242) SpO2:  [92 %-98 %] 95 % (06/11 1242)  Weight change:  Filed Weights   02/08/21 1948  Weight: 54.4 kg    Intake/Output: I/O last 3 completed shifts: In: 511.2 [I.V.:225.4; IV Piggyback:285.8] Out: -    Intake/Output this shift:  No intake/output data recorded.  Physical Exam: General: NAD, resting in bed  Head: Normocephalic, atraumatic. Moist oral mucosal membranes  Eyes: Anicteric  Neck: Supple, trachea midline  Lungs:  Clear to auscultation bilaterally  Heart: Regular rate and rhythm  Abdomen:  Soft, nontender, non distended  Extremities:  No peripheral edema.  Neurologic: Nonfocal, moving all four extremities  Skin: No lesions or rashes noted    Basic Metabolic Panel: Recent Labs  Lab 02/08/21 1952 02/08/21 2109 02/09/21 0036 02/09/21 0536 02/09/21 1921 02/10/21 0445  NA 103* 104* 110* 119* 119* 124*  K 2.9* 2.8*  --  3.1*  3.1* 3.1* 3.1*  CL 70* 68*  --  89*  --  97*  CO2 22 23  --  22  --  22  GLUCOSE 145* 143*  --  95  --  140*  BUN 13 13  --  10  --  12  CREATININE 0.50 0.66  --  0.57  --  0.56  CALCIUM 8.8* 8.9  --  8.1*  --  8.4*  MG  --  1.9  --  2.0  --  2.2  PHOS  --   --   --  2.0*  --  2.0*    Liver Function Tests: Recent Labs   Lab 02/08/21 1952 02/10/21 0445  AST 32 19  ALT 16 17  ALKPHOS 69 61  BILITOT 1.3* 0.7  PROT 7.9 6.6  ALBUMIN 4.4 3.5   No results for input(s): LIPASE, AMYLASE in the last 168 hours. No results for input(s): AMMONIA in the last 168 hours.  CBC: Recent Labs  Lab 02/08/21 1952 02/09/21 0536 02/10/21 0445  WBC 9.5 11.9* 9.6  NEUTROABS 5.6  --  7.6  HGB 13.9 12.6 12.1  HCT RESULTS UNAVAILABLE DUE TO INTERFERING SUBSTANCE RESULTS UNAVAILABLE DUE TO INTERFERING SUBSTANCE RESULTS UNAVAILABLE DUE TO INTERFERING SUBSTANCE  MCV RESULTS UNAVAILABLE DUE TO INTERFERING SUBSTANCE RESULTS UNAVAILABLE DUE TO INTERFERING SUBSTANCE RESULTS UNAVAILABLE DUE TO INTERFERING SUBSTANCE  PLT 310 263 286    Cardiac Enzymes: No results for input(s): CKTOTAL, CKMB, CKMBINDEX, TROPONINI in the last 168 hours.  BNP: Invalid input(s): POCBNP  CBG: No results for input(s): GLUCAP in the last 168 hours.  Microbiology: Results for orders placed or performed during the hospital encounter of 06/08/20  SARS CORONAVIRUS 2 (TAT 6-24 HRS) Nasopharyngeal Nasopharyngeal Swab     Status: None   Collection Time: 06/08/20  9:54 AM   Specimen: Nasopharyngeal Swab  Result Value  Ref Range Status   SARS Coronavirus 2 NEGATIVE NEGATIVE Final    Comment: (NOTE) SARS-CoV-2 target nucleic acids are NOT DETECTED.  The SARS-CoV-2 RNA is generally detectable in upper and lower respiratory specimens during the acute phase of infection. Negative results do not preclude SARS-CoV-2 infection, do not rule out co-infections with other pathogens, and should not be used as the sole basis for treatment or other patient management decisions. Negative results must be combined with clinical observations, patient history, and epidemiological information. The expected result is Negative.  Fact Sheet for Patients: SugarRoll.be  Fact Sheet for Healthcare  Providers: https://www.woods-mathews.com/  This test is not yet approved or cleared by the [place] FDA and  has been authorized for detection and/or diagnosis of SARS-CoV-2 by FDA under an Emergency Use Authorization (EUA). This EUA will remain  in effect (meaning this test can be used) for the duration of the COVID-19 declaration under Se ction 564(b)(1) of the Act, 21 U.S.C. section 360bbb-3(b)(1), unless the authorization is terminated or revoked sooner.  Performed at [facility], [place] [address], [place], [place] [address]     Coagulation Studies: No results for input(s): LABPROT, INR in the last 72 hours.  Urinalysis: Recent Labs    02/09/21 0600  COLORURINE STRAW*  LABSPEC 1.002*  PHURINE 7.0  GLUCOSEU NEGATIVE  HGBUR SMALL*  BILIRUBINUR NEGATIVE  KETONESUR 5*  PROTEINUR NEGATIVE  NITRITE NEGATIVE  LEUKOCYTESUR NEGATIVE      Imaging: DG Chest 2 View  Result Date: 02/08/2021 CLINICAL DATA:  Recent COVID diagnosis with weakness. EXAM: CHEST - 2 VIEW COMPARISON:  June 12, 2021 FINDINGS: Chronic appearing increased interstitial lung markings are seen with mild areas of atelectasis and/or early infiltrate noted within the bilateral lung bases. There is no evidence of a pleural effusion or pneumothorax. The cardiac silhouette is mildly enlarged. Moderate severity calcification of the aortic arch is noted. Degenerative changes are seen throughout the thoracic spine. IMPRESSION: Stable cardiomegaly with mild bibasilar atelectasis and/or early infiltrate. Electronically Signed   By: Virgina Norfolk M.D.   On: 02/08/2021 20:23   CT Head Wo Contrast  Result Date: 02/08/2021 CLINICAL DATA:  Weakness and altered mental status. EXAM: CT HEAD WITHOUT CONTRAST TECHNIQUE: Contiguous axial images were obtained from the base of the skull through the vertex without intravenous contrast. COMPARISON:  MR head, dated July 31, 2014. FINDINGS: Brain: There is  mild cerebral atrophy with widening of the extra-axial spaces and ventricular dilatation. There are areas of decreased attenuation within the white matter tracts of the supratentorial brain, consistent with microvascular disease changes. Vascular: No hyperdense vessel or unexpected calcification. Skull: Normal. Negative for fracture or focal lesion. Sinuses/Orbits: No acute finding. Other: Mild right posterior parietal scalp soft tissue swelling is seen. IMPRESSION: 1. No acute intracranial abnormality. Electronically Signed   By: Virgina Norfolk M.D.   On: 02/08/2021 21:43     Medications:     albuterol  2 puff Inhalation Q6H   vitamin C  500 mg Oral Daily   dexamethasone (DECADRON) injection  6 mg Intravenous Q24H   heparin injection (subcutaneous)  5,000 Units Subcutaneous Q8H   phosphorus  500 mg Oral Q4H   potassium chloride  40 mEq Oral TID   zinc sulfate  220 mg Oral Daily   chlorpheniramine-HYDROcodone, docusate sodium, guaiFENesin-dextromethorphan, polyethylene glycol  Assessment/ Plan:  Ms. ROSMARY DIONISIO is a 75 y.o.  female with medical problems of hyperlipidemia, hypertension, osteoporosis, postherpetic neuralgia  was admitted on  02/08/2021 for generalized weakness. She was hyponatremic on admission.  #Severe Hyponatremia Secondary to hypovolemia,poor oral intake Sodium improving, today 124 Patient and husband reports improvement with the symptms HCTZ on hold Will continue monitoring closely  #Hypokalemia K+3.1 today Getting treated with oral Potassium supplements  #Hypophosphatemia Phosphorus 2.0 Getting treated with Kphos  # Covid 19 Infection Continuous to be on Isolation precautions Denies SOB, GI symptoms improving Management per primary team      LOS: 1 [place] Borde[place] 6/11/202212:57 PM

## 2021-02-10 NOTE — ED Notes (Signed)
Pt noted to be resting comfortably in bed at this time. Pt denies further needs, NAD noted, RR even and unlabored at this time.

## 2021-02-10 NOTE — Progress Notes (Signed)
PROGRESS NOTE    Robin Peck  SEG:[id number] DOB: 06-10-1946 DOA: 02/08/2021 PCP: [name], MD    Brief Narrative:  Robin Peck is a 75 y.o. female who presented to the emergency room due to severe weakness. Per ED notes, patient was recently diagnosed with COVID on 02/01/2021 and started on nirmatrelvir/ritonavir EUA (PAXLOVID) on 02/02/2021. Since her diagnosis, she has been getting progressively weak and unable to walk or stand per husband  Patient was admitted to the ICU, was found with sodium of 104 and was started on hypertonic saline for severe hyponatremia.  6/11 sodium 124.TRH pickup on 6/10  Consultants:  PCCM, nephrology  Procedures:   Antimicrobials:      Subjective: Pt reports feeling better. Husband at bedside also reports she has improved.  Denies shortness of breath, chest pain, or dizziness  Objective: Vitals:   02/10/21 1456 02/10/21 1456 02/10/21 1600 02/10/21 1709  BP: (!) 163/63 (!) 163/63 (!) 153/64 (!) 155/69  Pulse:  80 72 72  Resp:  16 16 16   Temp:      TempSrc:      SpO2: 100% 100% 97% 97%  Weight:      Height:       No intake or output data in the 24 hours ending 02/10/21 1713 Filed Weights   02/08/21 1948  Weight: 54.4 kg    Examination:  General exam: Appears calm and comfortable  Respiratory system: Clear to auscultation. Respiratory effort normal. Cardiovascular system: S1 & S2 heard, RRR.  No gallops   Gastrointestinal system: Abdomen is nondistended, soft and nontender. Normal bowel sounds heard. Central nervous system: Alert and oriented.  Grossly intact  extremities: No edema Skin: Warm dry Psychiatry: Mood & affect appropriate in current setting    Data Reviewed: I have personally reviewed following labs and imaging studies  CBC: Recent Labs  Lab 02/08/21 1952 02/09/21 0536 02/10/21 0445  WBC 9.5 11.9* 9.6  NEUTROABS 5.6  --  7.6  HGB 13.9 12.6 12.1  HCT RESULTS UNAVAILABLE DUE TO INTERFERING SUBSTANCE  RESULTS UNAVAILABLE DUE TO INTERFERING SUBSTANCE RESULTS UNAVAILABLE DUE TO INTERFERING SUBSTANCE  MCV RESULTS UNAVAILABLE DUE TO INTERFERING SUBSTANCE RESULTS UNAVAILABLE DUE TO INTERFERING SUBSTANCE RESULTS UNAVAILABLE DUE TO INTERFERING SUBSTANCE  PLT [phone]   Basic Metabolic Panel: Recent Labs  Lab 02/08/21 1952 02/08/21 2109 02/09/21 0036 02/09/21 0536 02/09/21 1921 02/10/21 0445  NA 103* 104* 110* 119* 119* 124*  K 2.9* 2.8*  --  3.1*  3.1* 3.1* 3.1*  CL 70* 68*  --  89*  --  97*  CO2 22 23  --  22  --  22  GLUCOSE 145* 143*  --  95  --  140*  BUN 13 13  --  10  --  12  CREATININE 0.50 0.66  --  0.57  --  0.56  CALCIUM 8.8* 8.9  --  8.1*  --  8.4*  MG  --  1.9  --  2.0  --  2.2  PHOS  --   --   --  2.0*  --  2.0*   GFR: Estimated Creatinine Clearance: 45.8 mL/min (by C-G formula based on SCr of 0.56 mg/dL). Liver Function Tests: Recent Labs  Lab 02/08/21 1952 02/10/21 0445  AST 32 19  ALT 16 17  ALKPHOS 69 61  BILITOT 1.3* 0.7  PROT 7.9 6.6  ALBUMIN 4.4 3.5   No results for input(s): LIPASE, AMYLASE in the last 168 hours. No results  for input(s): AMMONIA in the last 168 hours. Coagulation Profile: No results for input(s): INR, PROTIME in the last 168 hours. Cardiac Enzymes: No results for input(s): CKTOTAL, CKMB, CKMBINDEX, TROPONINI in the last 168 hours. BNP (last 3 results) No results for input(s): PROBNP in the last 8760 hours. HbA1C: No results for input(s): HGBA1C in the last 72 hours. CBG: No results for input(s): GLUCAP in the last 168 hours. Lipid Profile: No results for input(s): CHOL, HDL, LDLCALC, TRIG, CHOLHDL, LDLDIRECT in the last 72 hours. Thyroid Function Tests: Recent Labs    02/10/21 0445  TSH 0.139*   Anemia Panel: Recent Labs    02/10/21 0445  FERRITIN 341*   Sepsis Labs: No results for input(s): PROCALCITON, LATICACIDVEN in the last 168 hours.  No results found for this or any previous visit (from the past 240  hour(s)).       Radiology Studies: DG Chest 2 View  Result Date: 02/08/2021 CLINICAL DATA:  Recent COVID diagnosis with weakness. EXAM: CHEST - 2 VIEW COMPARISON:  June 12, 2021 FINDINGS: Chronic appearing increased interstitial lung markings are seen with mild areas of atelectasis and/or early infiltrate noted within the bilateral lung bases. There is no evidence of a pleural effusion or pneumothorax. The cardiac silhouette is mildly enlarged. Moderate severity calcification of the aortic arch is noted. Degenerative changes are seen throughout the thoracic spine. IMPRESSION: Stable cardiomegaly with mild bibasilar atelectasis and/or early infiltrate. Electronically Signed   By: Virgina Norfolk M.D.   On: 02/08/2021 20:23   CT Head Wo Contrast  Result Date: 02/08/2021 CLINICAL DATA:  Weakness and altered mental status. EXAM: CT HEAD WITHOUT CONTRAST TECHNIQUE: Contiguous axial images were obtained from the base of the skull through the vertex without intravenous contrast. COMPARISON:  MR head, dated July 31, 2014. FINDINGS: Brain: There is mild cerebral atrophy with widening of the extra-axial spaces and ventricular dilatation. There are areas of decreased attenuation within the white matter tracts of the supratentorial brain, consistent with microvascular disease changes. Vascular: No hyperdense vessel or unexpected calcification. Skull: Normal. Negative for fracture or focal lesion. Sinuses/Orbits: No acute finding. Other: Mild right posterior parietal scalp soft tissue swelling is seen. IMPRESSION: 1. No acute intracranial abnormality. Electronically Signed   By: Virgina Norfolk M.D.   On: 02/08/2021 21:43        Scheduled Meds:  albuterol  2 puff Inhalation Q6H   vitamin C  500 mg Oral Daily   dexamethasone (DECADRON) injection  6 mg Intravenous Q24H   heparin injection (subcutaneous)  5,000 Units Subcutaneous Q8H   phosphorus  500 mg Oral Q4H   potassium chloride  40 mEq Oral  TID   zinc sulfate  220 mg Oral Daily   Continuous Infusions:  sodium chloride      Assessment & Plan:   Active Problems:   Hyponatremia   Severe Hypotonic Hyponatremia  Likely Hypovolemic in the setting of poor intake Osmolality 221 S/p 3% Hypertonic Saline  Na 124, spoke to Dr. Holley Raring via chat, rec. Holding off on ivf and fluid restrict today  Monitor Na levels Hctz was d/'cd    Acute Metabolic Encephalopathy in the setting of Severe Hyponatremia Treat hyponatremia as above CT of head negative Seizure precautions    COVID-19 infection  -Supplemental O2 as needed to maintain O2 saturations >92% -Follow intermittent ABG and chest x-ray as needed -As needed bronchodilators -Hold Paxlovid for now -6/11 continue Decadron  Incentive spirometer, flutter valve  Continue vitamin C and  zinc    Hypertension Labile at times. Will monitor if need to will start amlodipine   DVT prophylaxis: Heparin Code Status: Full Family Communication: Husband at bedside Disposition Plan:  Status is: Inpatient  Remains inpatient appropriate because:Inpatient level of care appropriate due to severity of illness  Dispo: The patient is from: Home              Anticipated d/c is to: Home              Patient currently is not medically stable to d/c.   Difficult to place patient No            LOS: 1 day   Time spent: 35 minutes with more than 50% on [place], MD [facility] Pager [phone]  If 7PM-7AM, please contact night-coverage 02/10/2021, 5:13 PM

## 2021-02-10 NOTE — ED Notes (Signed)
This RN to bedside at this time. Pt noted to be resting comfortably, awake in bed, NAD noted. Pt denies further needs at this time.

## 2021-02-10 NOTE — Consult Note (Signed)
[place] for Sodium Monitoring/ electrolytes monitoring.  Indication: Hyponatremia on 3% NaCl - currently discontinued.   Patient Measurements: Height: 5\' 1"  (154.9 cm) Weight: 54.4 kg (120 lb) IBW/kg (Calculated) : 47.8  BMP Latest Ref Rng & Units 02/10/2021 02/09/2021 02/09/2021  Glucose 70 - 99 mg/dL 140(H) - 95  BUN 8 - 23 mg/dL 12 - 10  Creatinine 0.44 - 1.00 mg/dL 0.56 - 0.57  BUN/Creat Ratio 6 - 22 (calc) - - -  Sodium 135 - 145 mmol/L 124(L) 119(LL) 119(LL)  Potassium 3.5 - 5.1 mmol/L 3.1(L) 3.1(L) 3.1(L)  Chloride 98 - 111 mmol/L 97(L) - 89(L)  CO2 22 - 32 mmol/L 22 - 22  Calcium 8.9 - 10.3 mg/dL 8.4(L) - 8.1(L)     Medications:  3% NaCl at 75 mL/hr - discontinued on 6/10 @0800 .   Assessment: Patient is a 75 y/o F with medical history including HTN, HLD, anxiety, lung nodule, recent history of COVID-19 infection who presented to the ED 6/9 with weakness. Subsequently found to have severe hyponatremia with a Na of 103 (last sodium 125 on 02/02/21). Patient is on HCTZ prior to admission. Patient started on 3% hypertonic saline at 75 mL/hr, now discontinued.   Estimated rate of correction on current rate = ~ 1 mmol/L/hr  Date / Time Na level 3% NaCl rate  6/9 at 1952 6/10 at [place] 6/10 at [place] 6/10 at 0445 103 110 119 124 75 cc / hr 75 cc / hr OFF OFF   3% NaCl is now off.  Goal of Therapy:  Goal for correction by 6/11 AM is 120-122 per nephrology notes  Plan:  Will continue to follow q4h Na levels as ordered.   Will order Kphos 2 tabs x 2 Will order Kcl 40 mEq x 3.   Eleonore Chiquito, PharmD, BCPS 02/10/2021 7:30 AM

## 2021-02-11 ENCOUNTER — Other Ambulatory Visit: Payer: Self-pay | Admitting: Family Medicine

## 2021-02-11 DIAGNOSIS — E876 Hypokalemia: Secondary | ICD-10-CM

## 2021-02-11 LAB — PHOSPHORUS: Phosphorus: 2.7 mg/dL (ref 2.5–4.6)

## 2021-02-11 LAB — COMPREHENSIVE METABOLIC PANEL
ALT: 18 U/L (ref 0–44)
AST: 29 U/L (ref 15–41)
Albumin: 3.7 g/dL (ref 3.5–5.0)
Alkaline Phosphatase: 68 U/L (ref 38–126)
Anion gap: 8 (ref 5–15)
BUN: 15 mg/dL (ref 8–23)
CO2: 22 mmol/L (ref 22–32)
Calcium: 8.5 mg/dL — ABNORMAL LOW (ref 8.9–10.3)
Chloride: 97 mmol/L — ABNORMAL LOW (ref 98–111)
Creatinine, Ser: 0.59 mg/dL (ref 0.44–1.00)
GFR, Estimated: >60 mL/min (ref >60)
Glucose, Bld: 155 mg/dL — ABNORMAL HIGH (ref 70–99)
Potassium: 4.2 mmol/L (ref 3.5–5.1)
Sodium: 127 mmol/L — ABNORMAL LOW (ref 135–145)
Total Bilirubin: 0.8 mg/dL (ref 0.3–1.2)
Total Protein: 6.9 g/dL (ref 6.5–8.1)

## 2021-02-11 LAB — CBC WITH DIFFERENTIAL/PLATELET
Abs Immature Granulocytes: 0.09 10*3/uL — ABNORMAL HIGH (ref 0.00–0.07)
Basophils Absolute: 0 10*3/uL (ref 0.0–0.1)
Basophils Relative: 0 %
Eosinophils Absolute: 0 10*3/uL (ref 0.0–0.5)
Eosinophils Relative: 0 %
HCT: 32.7 % — ABNORMAL LOW (ref 36.0–46.0)
Hemoglobin: 12.1 g/dL (ref 12.0–15.0)
Immature Granulocytes: 1 %
Lymphocytes Relative: 8 %
Lymphs Abs: 1.2 10*3/uL (ref 0.7–4.0)
MCH: 29 pg (ref 26.0–34.0)
MCHC: 37 g/dL — ABNORMAL HIGH (ref 30.0–36.0)
MCV: 78.4 fL — ABNORMAL LOW (ref 80.0–100.0)
Monocytes Absolute: 1.2 10*3/uL — ABNORMAL HIGH (ref 0.1–1.0)
Monocytes Relative: 8 %
Neutro Abs: 12.7 10*3/uL — ABNORMAL HIGH (ref 1.7–7.7)
Neutrophils Relative %: 83 %
Platelets: 344 10*3/uL (ref 150–400)
RBC: 4.17 MIL/uL (ref 3.87–5.11)
RDW: 11.7 % (ref 11.5–15.5)
WBC: 15.2 10*3/uL — ABNORMAL HIGH (ref 4.0–10.5)
nRBC: 0 % (ref 0.0–0.2)

## 2021-02-11 LAB — D-DIMER, QUANTITATIVE: D-Dimer, Quant: <0.27 ug/mL-FEU (ref 0.00–0.50)

## 2021-02-11 LAB — C-REACTIVE PROTEIN: CRP: <0.5 mg/dL (ref <1.0)

## 2021-02-11 LAB — FERRITIN: Ferritin: 364 ng/mL — ABNORMAL HIGH (ref 11–307)

## 2021-02-11 LAB — MAGNESIUM: Magnesium: 2.1 mg/dL (ref 1.7–2.4)

## 2021-02-11 MED ORDER — ASCORBIC ACID 500 MG PO TABS: 500.0000 mg | ORAL_TABLET | Freq: Every day | ORAL | 0 refills | Status: AC

## 2021-02-11 MED ORDER — LOSARTAN POTASSIUM 50 MG PO TABS
25.0000 mg | ORAL_TABLET | Freq: Every day | ORAL | Status: DC
  Administered 2021-02-11: 25 mg via ORAL
  Filled 2021-02-11: qty 1

## 2021-02-11 MED ORDER — PREDNISONE 20 MG PO TABS: 20.0000 mg | ORAL_TABLET | Freq: Every day | ORAL | 0 refills | Status: AC

## 2021-02-11 MED ORDER — LOSARTAN POTASSIUM 50 MG PO TABS: 50.0000 mg | ORAL_TABLET | Freq: Every day | ORAL | 0 refills | Status: DC

## 2021-02-11 MED ORDER — ZINC SULFATE 220 (50 ZN) MG PO CAPS: 220.0000 mg | ORAL_CAPSULE | Freq: Every day | ORAL | 0 refills | Status: AC

## 2021-02-11 MED ORDER — ALBUTEROL SULFATE HFA 108 (90 BASE) MCG/ACT IN AERS: 2.0000 | INHALATION_SPRAY | Freq: Four times a day (QID) | RESPIRATORY_TRACT | 0 refills | Status: DC | PRN

## 2021-02-11 MED ORDER — FAMOTIDINE 20 MG PO TABS: 20.0000 mg | ORAL_TABLET | Freq: Every day | ORAL | 0 refills | Status: DC

## 2021-02-11 NOTE — ED Notes (Signed)
Lab called by this RN to come collect morning labs.  

## 2021-02-11 NOTE — Discharge Summary (Signed)
Robin Peck AVW:[id number] DOB: [birth year] DOA: 02/08/2021  PCP: Robin Bush, MD  Admit date: 02/08/2021 Discharge date: 02/11/2021  Admitted From: home  Disposition:  home  Recommendations for Outpatient Follow-up:  Follow up with PCP in 1 week Please obtain BMP/CBC in one week       Discharge Condition:Stable CODE STATUS:full  Diet recommendation: Heart Healthy fluid restriction Brief/Interim Summary: Per Robin Peck is a 75 y.o. female who presented to the emergency room due to severe weakness. Per ED notes, patient was recently diagnosed with COVID on 02/01/2021 and started on nirmatrelvir/ritonavir EUA (PAXLOVID) on 02/02/2021. Since her diagnosis, she has been getting progressively weak and unable to walk or stand per husband.  She was found with severe hyponatremia with sodium of 104.  She was admitted initially to the ICU and received hypertonic saline and nephrology was consulted.  Then she was transferred out of ICU and was placed by fluid restriction 40 mL/day by nephrology.  She was also started on steroids due to COVID.   This a.m. her sodium levels improved to 127 and nephrology was okay with patient being discharged.  Severe Hypotonic Hyponatremia  Likely Hypovolemic in the setting of poor intake and Paxlovid S/p 3% Hypertonic Saline  Na improved to 127 this am . Nephrology recommended 40cc fluid restriction per day     Acute Metabolic Encephalopathy in the setting of Severe Hyponatremia CT of head negative Improved and at baseline     COVID-19 infection  Continue steroid tx with taper. Incentive spirometer, flutter valve Continue vitamin C and zinc  Holding paxlovid     Hypertension Continue losartan D/c hctz    Discharge Diagnoses:  Active Problems:   Hyponatremia    Discharge Instructions  Discharge Instructions     Call MD for:  persistant nausea and vomiting   Complete by: As directed    Diet - low sodium heart healthy   Complete  by: As directed    Discharge instructions   Complete by: As directed    DO not take Paxlovid or hydrochlorothiazide F/u with pcp next week for labs and to review your meds . Restrict fluid intake to 40 oz per day as nephrology instructed you until you see pcp   Increase activity slowly   Complete by: As directed       Allergies as of 02/11/2021       Reactions   Valacyclovir Hcl Other (See Comments), Anaphylaxis   Possible cause of rash, was concurrently on gabapentin Possible cause of rash, was concurrently on gabapentin   Gabapentin Rash   Possibly caused rash, was concurrently on valtrex.    Lisinopril Cough        Medication List     STOP taking these medications    ALPRAZolam 0.25 MG tablet Commonly known as: XANAX   atorvastatin 10 MG tablet Commonly known as: LIPITOR   b complex vitamins capsule   Calcium-Vitamin D 600-400 MG-UNIT Tabs   cetirizine 10 MG tablet Commonly known as: ZYRTEC   hydrochlorothiazide 25 MG tablet Commonly known as: HYDRODIURIL   ibuprofen 200 MG tablet Commonly known as: ADVIL   Klor-Con M20 20 MEQ tablet Generic drug: potassium chloride SA   pregabalin 50 MG capsule Commonly known as: Lyrica   propranolol ER 80 MG 24 hr capsule Commonly known as: INDERAL LA   Vitamin D3 25 MCG (1000 UT) Caps       TAKE these medications    albuterol 108 (90 Base) MCG/ACT inhaler  Commonly known as: VENTOLIN HFA Inhale 2 puffs into the lungs every 6 (six) hours as needed for wheezing or shortness of breath.   ascorbic acid 500 MG tablet Commonly known as: VITAMIN C Take 1 tablet (500 mg total) by mouth daily.   famotidine 20 MG tablet Commonly known as: PEPCID Take 1 tablet (20 mg total) by mouth daily for 14 days.   losartan 50 MG tablet Commonly known as: COZAAR Take 1 tablet (50 mg total) by mouth daily. What changed: when to take this   predniSONE 20 MG tablet Commonly known as: DELTASONE Take 1 tablet (20 mg total)  by mouth daily for 14 doses. Start taking on: February 12, 2021   zinc sulfate 220 (50 Zn) MG capsule Take 1 capsule (220 mg total) by mouth daily.        Follow-up Information     Robin Bush, MD Follow up.   Specialty: Family Medicine Why: need blood work Sport and exercise psychologist information: [place] [id number] [phone]                Allergies  Allergen Reactions   Valacyclovir Hcl Other (See Comments) and Anaphylaxis    Possible cause of rash, was concurrently on gabapentin Possible cause of rash, was concurrently on gabapentin   Gabapentin Rash    Possibly caused rash, was concurrently on valtrex.    Lisinopril Cough    Consultations: PCCM and nephrology   Procedures/Studies: DG Chest 2 View  Result Date: 02/08/2021 CLINICAL DATA:  Recent COVID diagnosis with weakness. EXAM: CHEST - 2 VIEW COMPARISON:  June 12, 2021 FINDINGS: Chronic appearing increased interstitial lung markings are seen with mild areas of atelectasis and/or early infiltrate noted within the bilateral lung bases. There is no evidence of a pleural effusion or pneumothorax. The cardiac silhouette is mildly enlarged. Moderate severity calcification of the aortic arch is noted. Degenerative changes are seen throughout the thoracic spine. IMPRESSION: Stable cardiomegaly with mild bibasilar atelectasis and/or early infiltrate. Electronically Signed   By: Robin Peck M.D.   On: 02/08/2021 20:23   CT Head Wo Contrast  Result Date: 02/08/2021 CLINICAL DATA:  Weakness and altered mental status. EXAM: CT HEAD WITHOUT CONTRAST TECHNIQUE: Contiguous axial images were obtained from the base of the skull through the vertex without intravenous contrast. COMPARISON:  MR head, dated July 31, 2014. FINDINGS: Brain: There is mild cerebral atrophy with widening of the extra-axial spaces and ventricular dilatation. There are areas of decreased attenuation within the white matter tracts of the  supratentorial brain, consistent with microvascular disease changes. Vascular: No hyperdense vessel or unexpected calcification. Skull: Normal. Negative for fracture or focal lesion. Sinuses/Orbits: No acute finding. Other: Mild right posterior parietal scalp soft tissue swelling is seen. IMPRESSION: 1. No acute intracranial abnormality. Electronically Signed   By: Robin Peck M.D.   On: 02/08/2021 21:43      Subjective: Feels better.  Denies shortness of breath, dizziness, weakness  Discharge Exam: Vitals:   02/11/21 0730 02/11/21 0800  BP: (!) 161/76 (!) 144/62  Pulse: 70 65  Resp: 16 15  Temp: 98.3 F (36.8 C)   SpO2: 91% 94%   Vitals:   02/11/21 0530 02/11/21 0621 02/11/21 0730 02/11/21 0800  BP: (!) 148/64 (!) 146/60 (!) 161/76 (!) 144/62  Pulse: 60 63 70 65  Resp: 16 16 16 15   Temp:   98.3 F (36.8 C)   TempSrc:      SpO2: 96% 92% 91% 94%  Weight:      Height:        General: Pt is alert, awake, not in acute distress Cardiovascular: RRR, S1/S2 +, no rubs, no gallops Respiratory: CTA bilaterally, no wheezing, no rhonchi Abdominal: Soft, NT, ND, bowel sounds + Extremities: no edema, no cyanosis    The results of significant diagnostics from this hospitalization (including imaging, microbiology, ancillary and laboratory) are listed below for reference.     Microbiology: No results found for this or any previous visit (from the past 240 hour(s)).   Labs: BNP (last 3 results) No results for input(s): BNP in the last 8760 hours. Basic Metabolic Panel: Recent Labs  Lab 02/08/21 1952 02/08/21 2109 02/09/21 0036 02/09/21 0536 02/09/21 1921 02/10/21 0445 02/11/21 0348  NA 103* 104* 110* 119* 119* 124* 127*  K 2.9* 2.8*  --  3.1*  3.1* 3.1* 3.1* 4.2  CL 70* 68*  --  89*  --  97* 97*  CO2 22 23  --  22  --  22 22  GLUCOSE 145* 143*  --  95  --  140* 155*  BUN 13 13  --  10  --  12 15  CREATININE 0.50 0.66  --  0.57  --  0.56 0.59  CALCIUM 8.8* 8.9  --   8.1*  --  8.4* 8.5*  MG  --  1.9  --  2.0  --  2.2 2.1  PHOS  --   --   --  2.0*  --  2.0* 2.7   Liver Function Tests: Recent Labs  Lab 02/08/21 1952 02/10/21 0445 02/11/21 0348  AST 32 19 29  ALT 16 17 18   ALKPHOS 69 61 68  BILITOT 1.3* 0.7 0.8  PROT 7.9 6.6 6.9  ALBUMIN 4.4 3.5 3.7   No results for input(s): LIPASE, AMYLASE in the last 168 hours. No results for input(s): AMMONIA in the last 168 hours. CBC: Recent Labs  Lab 02/08/21 1952 02/09/21 0536 02/10/21 0445 02/11/21 0348  WBC 9.5 11.9* 9.6 15.2*  NEUTROABS 5.6  --  7.6 12.7*  HGB 13.9 12.6 12.1 12.1  HCT RESULTS UNAVAILABLE DUE TO INTERFERING SUBSTANCE RESULTS UNAVAILABLE DUE TO INTERFERING SUBSTANCE RESULTS UNAVAILABLE DUE TO INTERFERING SUBSTANCE 32.7*  MCV RESULTS UNAVAILABLE DUE TO INTERFERING SUBSTANCE RESULTS UNAVAILABLE DUE TO INTERFERING SUBSTANCE RESULTS UNAVAILABLE DUE TO INTERFERING SUBSTANCE 78.4*  PLT 310 263 286 344   Cardiac Enzymes: No results for input(s): CKTOTAL, CKMB, CKMBINDEX, TROPONINI in the last 168 hours. BNP: Invalid input(s): POCBNP CBG: No results for input(s): GLUCAP in the last 168 hours. D-Dimer Recent Labs    02/10/21 0445 02/11/21 0348  DDIMER 2.55* <0.27   Hgb A1c No results for input(s): HGBA1C in the last 72 hours. Lipid Profile No results for input(s): CHOL, HDL, LDLCALC, TRIG, CHOLHDL, LDLDIRECT in the last 72 hours. Thyroid function studies Recent Labs    02/10/21 0445  TSH 0.139*   Anemia work up Recent Labs    02/10/21 0445 02/11/21 0348  FERRITIN 341* 364*   Urinalysis    Component Value Date/Time   COLORURINE STRAW (A) 02/09/2021 0600   APPEARANCEUR CLEAR (A) 02/09/2021 0600   APPEARANCEUR Clear 07/01/2016 0906   LABSPEC 1.002 (L) 02/09/2021 0600   PHURINE 7.0 02/09/2021 0600   GLUCOSEU NEGATIVE 02/09/2021 0600   GLUCOSEU NEGATIVE 01/11/2016 1001   HGBUR SMALL (A) 02/09/2021 0600   BILIRUBINUR NEGATIVE 02/09/2021 0600   BILIRUBINUR neg  09/03/2018 1352   BILIRUBINUR Negative 07/01/2016 0906  KETONESUR 5 (A) 02/09/2021 0600   PROTEINUR NEGATIVE 02/09/2021 0600   UROBILINOGEN 0.2 09/03/2018 1352   UROBILINOGEN 0.2 01/11/2016 1001   NITRITE NEGATIVE 02/09/2021 0600   LEUKOCYTESUR NEGATIVE 02/09/2021 0600   Sepsis Labs Invalid input(s): PROCALCITONIN,  WBC,  LACTICIDVEN Microbiology No results found for this or any previous visit (from the past 240 hour(s)).   Time coordinating discharge: Over 30 minutes  SIGNED:   Nolberto Hanlon, MD  [facility] 02/11/2021, 9:02 AM Pager   If 7PM-7AM, please contact night-coverage [email or web address] Password [email or web address]

## 2021-02-11 NOTE — ED Notes (Signed)
Patient denies pain and is resting comfortably.  

## 2021-02-11 NOTE — Consult Note (Signed)
[place] for Sodium Monitoring/ electrolytes monitoring.  Indication: Hyponatremia on 3% NaCl - currently discontinued.   Patient Measurements: Height: 5\' 1"  (154.9 cm) Weight: 54.4 kg (120 lb) IBW/kg (Calculated) : 47.8  BMP Latest Ref Rng & Units 02/11/2021 02/10/2021 02/09/2021  Glucose 70 - 99 mg/dL 155(H) 140(H) -  BUN 8 - 23 mg/dL 15 12 -  Creatinine 0.44 - 1.00 mg/dL 0.59 0.56 -  BUN/Creat Ratio 6 - 22 (calc) - - -  Sodium 135 - 145 mmol/L 127(L) 124(L) 119(LL)  Potassium 3.5 - 5.1 mmol/L 4.2 3.1(L) 3.1(L)  Chloride 98 - 111 mmol/L 97(L) 97(L) -  CO2 22 - 32 mmol/L 22 22 -  Calcium 8.9 - 10.3 mg/dL 8.5(L) 8.4(L) -     Medications:  3% NaCl at 75 mL/hr - discontinued on 6/10 @0800 .   Assessment: Patient is a 75 y/o F with medical history including HTN, HLD, anxiety, lung nodule, recent history of COVID-19 infection who presented to the ED 6/9 with weakness. Subsequently found to have severe hyponatremia with a Na of 103 (last sodium 125 on 02/02/21). Patient is on HCTZ prior to admission. Patient started on 3% hypertonic saline at 75 mL/hr, now discontinued.   Estimated rate of correction on current rate = ~ 1 mmol/L/hr  Date / Time Na level 3% NaCl rate  6/9 at 1952 6/10 at [place] 6/10 at [place] 6/10 at 0445 6/12 at 0348 103 110 119 124 127 75 cc / hr 75 cc / hr OFF OFF OFF   3% NaCl is now off.  Goal of Therapy:  Goal for correction by 6/11 AM is 120-122 per nephrology notes  Plan:  No replacement required at this time.  Follow up with AM labs.   Eleonore Chiquito, PharmD, BCPS 02/11/2021 7:53 AM

## 2021-02-11 NOTE — ED Notes (Signed)
Updated patient and husband that Dr. Kurtis Bushman is currently rounding on patients and will be in to see them as soon as he can.  Understanding verbalized.

## 2021-02-11 NOTE — ED Notes (Signed)
AAOx3.  Skin warm and dry.  No SOB/ DOE.  NAD 

## 2021-02-11 NOTE — ED Notes (Signed)
Ambulated to BR independently.  NO SOB/ DOE.  Tolerated well.

## 2021-02-12 ENCOUNTER — Encounter: Payer: Self-pay | Admitting: Family Medicine

## 2021-02-12 NOTE — Telephone Encounter (Signed)
Note attached states rx discontinued on 02/11/21 by Dr. Nolberto Hanlon.  Pt seen at [facility] ED on 02/08/21, dx, weakness (primary) and hyponatremia.   Klor-con Last rx:  01/15/21, #60 Last OV:  02/02/21 (video), COVID-19 inf Next OV:  AWV prt 2

## 2021-02-14 NOTE — Telephone Encounter (Signed)
TCM phone call not completed.  Please call to schedule hosp f/u visit either Friday this week or Mon next week.

## 2021-02-15 NOTE — Telephone Encounter (Signed)
A user error has taken place: encounter opened in error, closed for administrative reasons.

## 2021-02-15 NOTE — Telephone Encounter (Signed)
Pt was seen at [facility] ED on 02/08/21 and pt has HFU on 02/16/21.

## 2021-02-16 ENCOUNTER — Other Ambulatory Visit: Payer: Self-pay

## 2021-02-16 ENCOUNTER — Encounter: Payer: Self-pay | Admitting: Family Medicine

## 2021-02-16 ENCOUNTER — Ambulatory Visit (INDEPENDENT_AMBULATORY_CARE_PROVIDER_SITE_OTHER): Payer: Medicare Other | Admitting: Family Medicine

## 2021-02-16 VITALS — BP 156/76 | HR 96 | Temp 97.5°F | Ht 61.0 in | Wt 118.1 lb

## 2021-02-16 DIAGNOSIS — F410 Panic disorder [episodic paroxysmal anxiety] without agoraphobia: Secondary | ICD-10-CM

## 2021-02-16 DIAGNOSIS — I10 Essential (primary) hypertension: Secondary | ICD-10-CM

## 2021-02-16 DIAGNOSIS — B0229 Other postherpetic nervous system involvement: Secondary | ICD-10-CM | POA: Diagnosis not present

## 2021-02-16 DIAGNOSIS — E871 Hypo-osmolality and hyponatremia: Secondary | ICD-10-CM

## 2021-02-16 DIAGNOSIS — F064 Anxiety disorder due to known physiological condition: Secondary | ICD-10-CM | POA: Diagnosis not present

## 2021-02-16 DIAGNOSIS — E876 Hypokalemia: Secondary | ICD-10-CM | POA: Diagnosis not present

## 2021-02-16 DIAGNOSIS — U071 COVID-19: Secondary | ICD-10-CM

## 2021-02-16 DIAGNOSIS — M810 Age-related osteoporosis without current pathological fracture: Secondary | ICD-10-CM

## 2021-02-16 DIAGNOSIS — E7800 Pure hypercholesterolemia, unspecified: Secondary | ICD-10-CM

## 2021-02-16 DIAGNOSIS — R7989 Other specified abnormal findings of blood chemistry: Secondary | ICD-10-CM | POA: Diagnosis not present

## 2021-02-16 LAB — CBC WITH DIFFERENTIAL/PLATELET
Basophils Relative: 0.1 % (ref 0.0–3.0)
Eosinophils Relative: 1.7 % (ref 0.0–5.0)
HCT: 40.2 % (ref 36.0–46.0)
Hemoglobin: 13.5 g/dL (ref 12.0–15.0)
Lymphocytes Relative: 28.5 % (ref 12.0–46.0)
MCHC: 33.6 g/dL (ref 30.0–36.0)
MCV: 85.4 fl (ref 78.0–100.0)
Monocytes Relative: 9.6 % (ref 3.0–12.0)
Neutrophils Relative %: 60.1 % (ref 43.0–77.0)
Platelets: 463 10*3/uL — ABNORMAL HIGH (ref 150.0–400.0)
RBC: 4.7 Mil/uL (ref 3.87–5.11)
RDW: 12.8 % (ref 11.5–15.5)
WBC: 12.9 10*3/uL — ABNORMAL HIGH (ref 4.0–10.5)

## 2021-02-16 LAB — BASIC METABOLIC PANEL
BUN: 19 mg/dL (ref 6–23)
CO2: 26 mEq/L (ref 19–32)
Calcium: 9.6 mg/dL (ref 8.4–10.5)
Chloride: 96 mEq/L (ref 96–112)
Creatinine, Ser: 0.86 mg/dL (ref 0.40–1.20)
GFR: 66.26 mL/min (ref >60.00)
Glucose, Bld: 98 mg/dL (ref 70–99)
Potassium: 4.3 mEq/L (ref 3.5–5.1)
Sodium: 130 mEq/L — ABNORMAL LOW (ref 135–145)

## 2021-02-16 LAB — PHOSPHORUS: Phosphorus: 3.7 mg/dL (ref 2.3–4.6)

## 2021-02-16 LAB — MAGNESIUM: Magnesium: 2.3 mg/dL (ref 1.5–2.5)

## 2021-02-16 MED ORDER — LOSARTAN POTASSIUM 100 MG PO TABS: 100.0000 mg | ORAL_TABLET | Freq: Every day | ORAL | 1 refills | Status: DC

## 2021-02-16 MED ORDER — CALCIUM-VITAMIN D 600-400 MG-UNIT PO TABS: 1.0000 | ORAL_TABLET | Freq: Every day | ORAL | Status: DC

## 2021-02-16 NOTE — Patient Instructions (Addendum)
Labs today Increase losartan to 100mg  daily.  Stay off propranolol and hydrochlorothiazide for now  Continue water and pedialyte - continue limiting fluids.  We will recheck again at your wellness visit.  Continue tapering off lyrica. Ok to use xanax as needed

## 2021-02-16 NOTE — Progress Notes (Signed)
Patient ID: Robin Peck, female    DOB: 1946/01/13, 75 y.o.   MRN: [id number]  This visit was conducted in person.  BP (!) 156/76   Pulse 96   Temp (!) 97.5 F (36.4 C) (Temporal)   Ht 5\' 1"  (1.549 m)   Wt 118 lb 1 oz (53.6 kg)   SpO2 99%   BMI 22.31 kg/m   BP 160/80 on retesting  CC: hosp f/u visit Subjective:   HPI: Robin Peck is a 75 y.o. female presenting on 02/16/2021 for Hospitalization Follow-up (Seen on 02/08/21 at [facility] ED, dx weakness; hyponatremia. Pt accompanied by husband, [name]- temp 97.5.)   Recent hospitalization records reviewed.  Dx COVID 01/31/2021. Treated with Paxlovid. Course complicated by headache, dizziness, weakness, confusion, s/p ER visit found to have severe hyponatremia to 104. Head CT showed chronic microvascular disease without acute findings. Admitted to ICU, received hypertonic saline, followed by nephrology. Sodium levels improved to 127 on discharge. Placed on 40 mL/day fluid restriction until sodium levels back to normal. Started on steroids due to COVID.   Hydrochlorothiazide along with alprazolam, atorvastatin, b complex, cal/vit D, Klor-Con, pregabalin, propranolol ER and ibuprofen were stopped. Was on lyrica for PHN. They have continued potassium daily, taking lyrica PRN, continue xanax PRN.   Losartan was continued for hypertension but at 50mg  daily (was on 50mg  bid).  Discharged on prednisone 1 week taper. Continued vit C and zinc.   Recent dental work may have lead to poor PO intake earlier this year. Significant nausea due to COVID also limited PO intake recently.   Since home feeling well. Drinking pedialyte.  Feels much better than previously.    Admit date: 02/08/2021 Discharge date: 02/11/2021 TCM phone call not completed.   Admitted From: home  Disposition:  home   Recommendations for Outpatient Follow-up:  Follow up with PCP in 1 week Please obtain BMP/CBC in one week  Discharge Condition:Stable CODE STATUS:full   Diet recommendation: Heart Healthy fluid restriction   Discharge Diagnoses:  Active Problems:   Hyponatremia COVID-19 infection     Relevant past medical, surgical, family and social history reviewed and updated as indicated. Interim medical history since our last visit reviewed. Allergies and medications reviewed and updated. Outpatient Medications Prior to Visit  Medication Sig Dispense Refill   albuterol (VENTOLIN HFA) 108 (90 Base) MCG/ACT inhaler Inhale 2 puffs into the lungs every 6 (six) hours as needed for wheezing or shortness of breath. 6.7 g 0   ascorbic acid (VITAMIN C) 500 MG tablet Take 1 tablet (500 mg total) by mouth daily. 30 tablet 0   famotidine (PEPCID) 20 MG tablet Take 1 tablet (20 mg total) by mouth daily for 14 days. 14 tablet 0   predniSONE (DELTASONE) 20 MG tablet Take 1 tablet (20 mg total) by mouth daily for 14 doses. 14 tablet 0   zinc sulfate 220 (50 Zn) MG capsule Take 1 capsule (220 mg total) by mouth daily. 30 capsule 0   losartan (COZAAR) 50 MG tablet Take 1 tablet (50 mg total) by mouth daily. 30 tablet 0   No facility-administered medications prior to visit.     Per HPI unless specifically indicated in ROS section below Review of Systems Objective:  BP (!) 156/76   Pulse 96   Temp (!) 97.5 F (36.4 C) (Temporal)   Ht 5\' 1"  (1.549 m)   Wt 118 lb 1 oz (53.6 kg)   SpO2 99%   BMI 22.31 kg/m  Wt Readings from Last 3 Encounters:  02/16/21 118 lb 1 oz (53.6 kg)  02/08/21 120 lb (54.4 kg)  02/02/21 126 lb (57.2 kg)      Physical Exam Vitals and nursing note reviewed.  Constitutional:      Appearance: Normal appearance. She is not ill-appearing.     Comments: Ambulates without assistance  Cardiovascular:     Rate and Rhythm: Normal rate and regular rhythm.     Pulses: Normal pulses.     Heart sounds: Normal heart sounds. No murmur heard. Pulmonary:     Effort: Pulmonary effort is normal. No respiratory distress.     Breath sounds:  Normal breath sounds. No wheezing, rhonchi or rales.  Musculoskeletal:     Right lower leg: No edema.     Left lower leg: No edema.  Neurological:     Mental Status: She is alert.  Psychiatric:        Mood and Affect: Mood normal.        Behavior: Behavior normal.      Results for orders placed or performed in visit on 35/36/14  Basic metabolic panel  Result Value Ref Range   Sodium 130 (L) 135 - 145 mEq/L   Potassium 4.3 3.5 - 5.1 mEq/L   Chloride 96 96 - 112 mEq/L   CO2 26 19 - 32 mEq/L   Glucose, Bld 98 70 - 99 mg/dL   BUN 19 6 - 23 mg/dL   Creatinine, Ser 0.86 0.40 - 1.20 mg/dL   GFR 66.26 >60.00 mL/min   Calcium 9.6 8.4 - 10.5 mg/dL  CBC with Differential/Platelet  Result Value Ref Range   WBC 12.9 (H) 4.0 - 10.5 K/uL   RBC 4.70 3.87 - 5.11 Mil/uL   Hemoglobin 13.5 12.0 - 15.0 g/dL   HCT 40.2 36.0 - 46.0 %   MCV 85.4 78.0 - 100.0 fl   MCHC 33.6 30.0 - 36.0 g/dL   RDW 12.8 11.5 - 15.5 %   Platelets 463.0 (H) 150.0 - 400.0 K/uL   Neutrophils Relative % 60.1 43.0 - 77.0 %   Lymphocytes Relative 28.5 12.0 - 46.0 %   Monocytes Relative 9.6 3.0 - 12.0 %   Eosinophils Relative 1.7 0.0 - 5.0 %   Basophils Relative 0.1 0.0 - 3.0 %  Magnesium  Result Value Ref Range   Magnesium 2.3 1.5 - 2.5 mg/dL  Phosphorus  Result Value Ref Range   Phosphorus 3.7 2.3 - 4.6 mg/dL    Assessment & Plan:  This visit occurred during the SARS-CoV-2 public health emergency.  Safety protocols were in place, including screening questions prior to the visit, additional usage of staff PPE, and extensive cleaning of exam room while observing appropriate contact time as indicated for disinfecting solutions.   Problem List Items Addressed This Visit     HLD (hyperlipidemia)    Atorvastatin stopped during hospitalization. Will update FLP off statin next month at CPE and determine ongoing need.        Relevant Medications   losartan (COZAAR) 100 MG tablet   Other Relevant Orders   Lipid panel    TSH   T4, free   Essential hypertension    Previously on ARB, HCTZ, propranolol. Only ARB was continued, at lower dose. BP remains elevated. Will increase losartan to 100mg  daily and reassess control at CPE next month. Will remain off HCTZ at this time.  Monitor pulse off propranolol.        Relevant Medications   losartan (  COZAAR) 100 MG tablet   Chronic hypokalemia    Pt/husband endorse longstanding h/o hyponatremia.  Will stay off hctz at this time but husband continues daily potassium at 52mEq daily given hx. Check electrolytes today.  Will need RAS axis evaluated at f/u labs.        Relevant Orders   Phosphorus (Completed)   Basic metabolic panel   Aldosterone + renin activity w/ ratio   Anxiety disorder due to general medical condition with panic attack    Discussed ok to use xanax PRN.        PHN (postherpetic neuralgia)    lyrica 50mg  discontinued during recent hospitalization. She has continued PRN. Will continue to monitor with PRN use. This is not associated with hyponatremia.        Relevant Orders   CBC with Differential/Platelet   Osteoporosis    Cal, vit D stopped during hospitalization - will restart.        Relevant Medications   Calcium Carb-Cholecalciferol (CALCIUM-VITAMIN D) 600-400 MG-UNIT TABS   COVID-19 virus infection    Symptoms have fully resolved.        Hyponatremia - Primary    Severe hyponatremia presumed from poor PO intake in setting of COVID infection and with medications contributing (HCTZ), s/p hospitalization with recovery. Continue fluid restriction until levels have normalized. Update electrolytes today.        Relevant Orders   Basic metabolic panel (Completed)   CBC with Differential/Platelet (Completed)   Magnesium (Completed)   Phosphorus (Completed)   Basic metabolic panel   Abnormal TSH    Noted during hospitalization - anticipate sick euthyroid. Update TSH, fT4.          Meds ordered this encounter   Medications   Calcium Carb-Cholecalciferol (CALCIUM-VITAMIN D) 600-400 MG-UNIT TABS    Sig: Take 1 tablet by mouth daily.   losartan (COZAAR) 100 MG tablet    Sig: Take 1 tablet (100 mg total) by mouth daily.    Dispense:  90 tablet    Refill:  1    Note new sig    Orders Placed This Encounter  Procedures   Basic metabolic panel   CBC with Differential/Platelet   Magnesium   Phosphorus   Lipid panel    Standing Status:   Future    Standing Expiration Date:   02/17/2022   TSH    Standing Status:   Future    Standing Expiration Date:   02/17/2022   T4, free    Standing Status:   Future    Standing Expiration Date:   02/17/2022   CBC with Differential/Platelet    Standing Status:   Future    Standing Expiration Date:   04/26/36   Basic metabolic panel    Standing Status:   Future    Standing Expiration Date:   02/17/2022   Aldosterone + renin activity w/ ratio    Standing Status:   Future    Standing Expiration Date:   02/17/2022     Patient Instructions  Labs today Increase losartan to 100mg  daily.  Stay off propranolol and hydrochlorothiazide for now  Continue water and pedialyte - continue limiting fluids.  We will recheck again at your wellness visit.  Continue tapering off lyrica. Ok to use xanax as needed   Follow up plan: No follow-ups on file.  [name], MD

## 2021-02-17 DIAGNOSIS — R7989 Other specified abnormal findings of blood chemistry: Secondary | ICD-10-CM | POA: Insufficient documentation

## 2021-02-17 NOTE — Assessment & Plan Note (Signed)
Symptoms have fully resolved.

## 2021-02-17 NOTE — Assessment & Plan Note (Signed)
Atorvastatin stopped during hospitalization. Will update FLP off statin next month at CPE and determine ongoing need.

## 2021-02-17 NOTE — Assessment & Plan Note (Signed)
Pt/husband endorse longstanding h/o hyponatremia.  Will stay off hctz at this time but husband continues daily potassium at 2mEq daily given hx. Check electrolytes today.  Will need RAS axis evaluated at f/u labs.

## 2021-02-17 NOTE — Assessment & Plan Note (Signed)
Severe hyponatremia presumed from poor PO intake in setting of COVID infection and with medications contributing (HCTZ), s/p hospitalization with recovery. Continue fluid restriction until levels have normalized. Update electrolytes today.

## 2021-02-17 NOTE — Addendum Note (Signed)
Addended by: [name] on: 02/17/2021 10:50 AM   Modules accepted: Orders

## 2021-02-17 NOTE — Assessment & Plan Note (Addendum)
lyrica 50mg  discontinued during recent hospitalization. She has continued PRN. Will continue to monitor with PRN use. This is not associated with hyponatremia.

## 2021-02-17 NOTE — Assessment & Plan Note (Signed)
Noted during hospitalization - anticipate sick euthyroid. Update TSH, fT4.

## 2021-02-17 NOTE — Assessment & Plan Note (Addendum)
Previously on ARB, HCTZ, propranolol. Only ARB was continued, at lower dose. BP remains elevated. Will increase losartan to 100mg  daily and reassess control at CPE next month. Will remain off HCTZ at this time.  Monitor pulse off propranolol.

## 2021-02-17 NOTE — Assessment & Plan Note (Signed)
Discussed ok to use xanax PRN.

## 2021-02-17 NOTE — Assessment & Plan Note (Signed)
Cal, vit D stopped during hospitalization - will restart.

## 2021-02-19 ENCOUNTER — Other Ambulatory Visit: Payer: Self-pay | Admitting: Family Medicine

## 2021-02-20 NOTE — Telephone Encounter (Signed)
Per 02/16/21 OV notes, atorvastatin was d/c during recent hospital stay.  Labs will be done 03/16/21 for upcoming AWV to see if pt needs to restart med.   Denied request at this time.

## 2021-02-21 ENCOUNTER — Other Ambulatory Visit: Payer: Self-pay | Admitting: Family Medicine

## 2021-02-21 NOTE — Telephone Encounter (Signed)
Refill recently denied.   Last OV: 02/16/21, hosp f/u Next OV:  03/23/21, AWV prt 2

## 2021-02-22 NOTE — Telephone Encounter (Signed)
Should be taking 1mEq of potassium daiyl now - does she need refill at this time, do they want 67mEq dose sent in instead of 81mEq?  Plan to recheck levels at CPE next month.

## 2021-02-22 NOTE — Telephone Encounter (Signed)
Mychart message sent to pt in regards to Dr. Synthia Innocent message.

## 2021-02-23 NOTE — Telephone Encounter (Signed)
Left message on vm per dpr asking pt to call back or respond to MyChart message concerning potassium rx.

## 2021-02-23 NOTE — Telephone Encounter (Signed)
Patient was seen on 02/16/21 for hospital follow up.

## 2021-02-24 ENCOUNTER — Other Ambulatory Visit: Payer: Self-pay | Admitting: Family Medicine

## 2021-02-24 DIAGNOSIS — F064 Anxiety disorder due to known physiological condition: Secondary | ICD-10-CM

## 2021-02-26 NOTE — Telephone Encounter (Signed)
Name of Medication: Alprazolam Name of Pharmacy: [facility]  Last Fill or Written Date and Quantity: 02/17/21, #30 Last Office Visit and Type: 02/16/21, hosp f/u Next Office Visit and Type: 03/23/21, AWV prt 2 Last Controlled Substance Agreement Date: none Last UDS: none

## 2021-02-27 MED ORDER — ALPRAZOLAM 0.25 MG PO TABS: 0.2500 mg | ORAL_TABLET | Freq: Two times a day (BID) | ORAL | 0 refills | Status: DC | PRN

## 2021-02-27 NOTE — Telephone Encounter (Signed)
ERx 

## 2021-02-27 NOTE — Telephone Encounter (Signed)
Left message on vm per dpr asking pt to call back or respond to MyChart message concerning potassium rx.

## 2021-02-28 NOTE — Telephone Encounter (Signed)
Left message on vm per dpr asking pt to call back or respond to MyChart message concerning potassium rx.  Mailing a letter.

## 2021-03-15 ENCOUNTER — Ambulatory Visit (INDEPENDENT_AMBULATORY_CARE_PROVIDER_SITE_OTHER): Payer: Medicare Other

## 2021-03-15 VITALS — BP 167/85

## 2021-03-15 DIAGNOSIS — Z0000 Encounter for general adult medical examination without abnormal findings: Secondary | ICD-10-CM | POA: Diagnosis not present

## 2021-03-15 NOTE — Patient Instructions (Signed)
Robin Peck , Thank you for taking time to come for your Medicare Wellness Visit. I appreciate your ongoing commitment to your health goals. Please review the following plan we discussed and let me know if I can assist you in the future.   Screening recommendations/referrals: Colonoscopy: due, will discuss with provider at physical Mammogram: Up to date, completed 04/06/2020, due 04/2021 Bone Density: Up to date, completed 04/04/2020, due 04/2022 Recommended yearly ophthalmology/optometry visit for glaucoma screening and checkup Recommended yearly dental visit for hygiene and checkup  Vaccinations: Influenza vaccine: Up to date, completed 08/30/2020, due 04/2021 Pneumococcal vaccine: due, will discuss with provider at physical Tdap vaccine: Up to date, completed 01/23/2016, due 12/2025 Shingles vaccine: Completed series   Covid-19:declined   Advanced directives: Please bring a copy of your POA (Power of [place]) and/or Living Will to your next appointment.   Conditions/risks identified: hypertension, hyperlipidemia   Next appointment: Follow up in one year for your annual wellness visit    Preventive Care 26 Years and Older, Female Preventive care refers to lifestyle choices and visits with your health care provider that can promote health and wellness. What does preventive care include? A yearly physical exam. This is also called an annual well check. Dental exams once or twice a year. Routine eye exams. Ask your health care provider how often you should have your eyes checked. Personal lifestyle choices, including: Daily care of your teeth and gums. Regular physical activity. Eating a healthy diet. Avoiding tobacco and drug use. Limiting alcohol use. Practicing safe sex. Taking low-dose aspirin every day. Taking vitamin and mineral supplements as recommended by your health care provider. What happens during an annual well check? The services and screenings done by your health care  provider during your annual well check will depend on your age, overall health, lifestyle risk factors, and family history of disease. Counseling  Your health care provider may ask you questions about your: Alcohol use. Tobacco use. Drug use. Emotional well-being. Home and relationship well-being. Sexual activity. Eating habits. History of falls. Memory and ability to understand (cognition). Work and work Statistician. Reproductive health. Screening  You may have the following tests or measurements: Height, weight, and BMI. Blood pressure. Lipid and cholesterol levels. These may be checked every 5 years, or more frequently if you are over 21 years old. Skin check. Lung cancer screening. You may have this screening every year starting at age 74 if you have a 30-pack-year history of smoking and currently smoke or have quit within the past 15 years. Fecal occult blood test (FOBT) of the stool. You may have this test every year starting at age 13. Flexible sigmoidoscopy or colonoscopy. You may have a sigmoidoscopy every 5 years or a colonoscopy every 10 years starting at age 22. Hepatitis C blood test. Hepatitis B blood test. Sexually transmitted disease (STD) testing. Diabetes screening. This is done by checking your blood sugar (glucose) after you have not eaten for a while (fasting). You may have this done every 1-3 years. Bone density scan. This is done to screen for osteoporosis. You may have this done starting at age 60. Mammogram. This may be done every 1-2 years. Talk to your health care provider about how often you should have regular mammograms. Talk with your health care provider about your test results, treatment options, and if necessary, the need for more tests. Vaccines  Your health care provider may recommend certain vaccines, such as: Influenza vaccine. This is recommended every year. Tetanus, diphtheria, and acellular  pertussis (Tdap, Td) vaccine. You may need a Td  booster every 10 years. Zoster vaccine. You may need this after age 25. Pneumococcal 13-valent conjugate (PCV13) vaccine. One dose is recommended after age 58. Pneumococcal polysaccharide (PPSV23) vaccine. One dose is recommended after age 29. Talk to your health care provider about which screenings and vaccines you need and how often you need them. This information is not intended to replace advice given to you by your health care provider. Make sure you discuss any questions you have with your health care provider. Document Released: 09/15/2015 Document Revised: 05/08/2016 Document Reviewed: 06/20/2015 Elsevier Interactive Patient Education  2017 [place] Prevention in the Home Falls can cause injuries. They can happen to people of all ages. There are many things you can do to make your home safe and to help prevent falls. What can I do on the outside of my home? Regularly fix the edges of walkways and driveways and fix any cracks. Remove anything that might make you trip as you walk through a door, such as a raised step or threshold. Trim any bushes or trees on the path to your home. Use bright outdoor lighting. Clear any walking paths of anything that might make someone trip, such as rocks or tools. Regularly check to see if handrails are loose or broken. Make sure that both sides of any steps have handrails. Any raised decks and porches should have guardrails on the edges. Have any leaves, snow, or ice cleared regularly. Use sand or salt on walking paths during winter. Clean up any spills in your garage right away. This includes oil or grease spills. What can I do in the bathroom? Use night lights. Install grab bars by the toilet and in the tub and shower. Do not use towel bars as grab bars. Use non-skid mats or decals in the tub or shower. If you need to sit down in the shower, use a plastic, non-slip stool. Keep the floor dry. Clean up any water that spills on the floor  as soon as it happens. Remove soap buildup in the tub or shower regularly. Attach bath mats securely with double-sided non-slip rug tape. Do not have throw rugs and other things on the floor that can make you trip. What can I do in the bedroom? Use night lights. Make sure that you have a light by your bed that is easy to reach. Do not use any sheets or blankets that are too big for your bed. They should not hang down onto the floor. Have a firm chair that has side arms. You can use this for support while you get dressed. Do not have throw rugs and other things on the floor that can make you trip. What can I do in the kitchen? Clean up any spills right away. Avoid walking on wet floors. Keep items that you use a lot in easy-to-reach places. If you need to reach something above you, use a strong step stool that has a grab bar. Keep electrical cords out of the way. Do not use floor polish or wax that makes floors slippery. If you must use wax, use non-skid floor wax. Do not have throw rugs and other things on the floor that can make you trip. What can I do with my stairs? Do not leave any items on the stairs. Make sure that there are handrails on both sides of the stairs and use them. Fix handrails that are broken or loose. Make sure that handrails  are as long as the stairways. Check any carpeting to make sure that it is firmly attached to the stairs. Fix any carpet that is loose or worn. Avoid having throw rugs at the top or bottom of the stairs. If you do have throw rugs, attach them to the floor with carpet tape. Make sure that you have a light switch at the top of the stairs and the bottom of the stairs. If you do not have them, ask someone to add them for you. What else can I do to help prevent falls? Wear shoes that: Do not have high heels. Have rubber bottoms. Are comfortable and fit you well. Are closed at the toe. Do not wear sandals. If you use a stepladder: Make sure that it is  fully opened. Do not climb a closed stepladder. Make sure that both sides of the stepladder are locked into place. Ask someone to hold it for you, if possible. Clearly mark and make sure that you can see: Any grab bars or handrails. First and last steps. Where the edge of each step is. Use tools that help you move around (mobility aids) if they are needed. These include: Canes. Walkers. Scooters. Crutches. Turn on the lights when you go into a dark area. Replace any light bulbs as soon as they burn out. Set up your furniture so you have a clear path. Avoid moving your furniture around. If any of your floors are uneven, fix them. If there are any pets around you, be aware of where they are. Review your medicines with your doctor. Some medicines can make you feel dizzy. This can increase your chance of falling. Ask your doctor what other things that you can do to help prevent falls. This information is not intended to replace advice given to you by your health care provider. Make sure you discuss any questions you have with your health care provider. Document Released: 06/15/2009 Document Revised: 01/25/2016 Document Reviewed: 09/23/2014 Elsevier Interactive Patient Education  2017 [employer].

## 2021-03-15 NOTE — Progress Notes (Signed)
Subjective:   Robin Peck is a 75 y.o. female who presents for Medicare Annual (Subsequent) preventive examination.  Review of Systems: N/A    I connected with patient today by a video enabled telemedicine application and verified that I am speaking with the correct person using two identifiers.  Location patient: home  Location nurse: work  Persons participating in the virtual visit: patient, nurse     I discussed the limitations, risks, security, and privacy concerns of performing an evaluation and management service by telephone and the availability of in person appointments. The patient expressed understanding and agreed to proceed.  Cardiac Risk Factors include: advanced age (>63men, >38 women);hypertension;Other (see comment), Risk factor comments: hyperlipidemia     Objective:    Today's Vitals   03/15/21 0753  BP: (!) 167/85   There is no height or weight on file to calculate BMI.  Advanced Directives 03/15/2021 06/12/2020 09/21/2019 07/29/2018 05/01/2016  Does Patient Have a Medical Advance Directive? Yes No;Yes No Yes Yes  Type of Paramedic of [place];Living will - - [place];Living will Living will;Healthcare Power of Attorney  Does patient want to make changes to medical advance directive? - - - - No - Patient declined  Copy of [place] in Chart? No - copy requested - - No - copy requested -  Would patient like information on creating a medical advance directive? - - No - Patient declined - -    Current Medications (verified) Outpatient Encounter Medications as of 03/15/2021  Medication Sig   ALPRAZolam (XANAX) 0.25 MG tablet Take 1 tablet (0.25 mg total) by mouth 2 (two) times daily as needed for anxiety or sleep.   Calcium Carb-Cholecalciferol (CALCIUM-VITAMIN D) 600-400 MG-UNIT TABS Take 1 tablet by mouth daily.   losartan (COZAAR) 100 MG tablet Take 1 tablet (100 mg total) by mouth daily.    potassium chloride SA (KLOR-CON M20) 20 MEQ tablet Take 0.5 tablets (10 mEq total) by mouth 2 (two) times daily.   albuterol (VENTOLIN HFA) 108 (90 Base) MCG/ACT inhaler Inhale 2 puffs into the lungs every 6 (six) hours as needed for wheezing or shortness of breath.   famotidine (PEPCID) 20 MG tablet Take 1 tablet (20 mg total) by mouth daily for 14 days.   [DISCONTINUED] atorvastatin (LIPITOR) 10 MG tablet TAKE ONE TABLET BY MOUTH DAILY   [DISCONTINUED] cetirizine (ZYRTEC) 10 MG tablet TAKE 1 TABLET (10 MG TOTAL) BY MOUTH DAILY. (Patient taking differently: Take 10 mg by mouth daily as needed (seasonal allergies.).)   [DISCONTINUED] hydrochlorothiazide (HYDRODIURIL) 25 MG tablet TAKE ONE TABLET BY MOUTH DAILY   [DISCONTINUED] potassium chloride 20 MEQ TBCR Take 10 mEq by mouth 2 (two) times daily.   [DISCONTINUED] pregabalin (LYRICA) 50 MG capsule Take 1 capsule (50 mg total) by mouth daily.   [DISCONTINUED] propranolol ER (INDERAL LA) 80 MG 24 hr capsule TAKE ONE CAPSULE BY MOUTH DAILY   No facility-administered encounter medications on file as of 03/15/2021.    Allergies (verified) Valacyclovir hcl, Gabapentin, and Lisinopril   History: Past Medical History:  Diagnosis Date   Allergic rhinitis    Allergy    seasonal   Anxiety    Arthritis    Heart murmur    Hyperlipidemia    Hypertension    Past Surgical History:  Procedure Laterality Date   ABDOMINAL HYSTERECTOMY     COLONOSCOPY  06/2020   3.5cm lesion removed piecemeal, smaller polyp removed, int hem ([place])  COLONOSCOPY WITH PROPOFOL N/A 06/12/2020   Procedure: COLONOSCOPY WITH PROPOFOL;  Surgeon: Rush Landmark Telford Nab., MD;  Location: [place];  Service: Gastroenterology;  Laterality: N/A;   ENDOSCOPIC MUCOSAL RESECTION N/A 06/12/2020   Procedure: ENDOSCOPIC MUCOSAL RESECTION;  Surgeon: Rush Landmark Telford Nab., MD;  Location: [place];  Service: Gastroenterology;  Laterality: N/A;   HEMOSTASIS CLIP PLACEMENT   06/12/2020   Procedure: HEMOSTASIS CLIP PLACEMENT;  Surgeon: Irving Copas., MD;  Location: [place];  Service: Gastroenterology;;   PARTIAL HYSTERECTOMY  2002   hysterectomy   POLYPECTOMY  06/12/2020   Procedure: POLYPECTOMY;  Surgeon: Rush Landmark Telford Nab., MD;  Location: [place];  Service: Gastroenterology;;   SUBMUCOSAL LIFTING INJECTION  06/12/2020   Procedure: SUBMUCOSAL LIFTING INJECTION;  Surgeon: Irving Copas., MD;  Location: [facility];  Service: Gastroenterology;;   Family History  Problem Relation Age of Onset   Heart attack Brother    Hypertension Brother    Hypertension Father    Hypertension Mother    Stroke Mother    Hypertension Sister    Heart attack Maternal Grandfather    Colon cancer Neg Hx    Stomach cancer Neg Hx    Esophageal cancer Neg Hx    Pancreatic cancer Neg Hx    Liver cancer Neg Hx    Inflammatory bowel disease Neg Hx    Rectal cancer Neg Hx    Colon polyps Neg Hx    Social History   Socioeconomic History   Marital status: Married    Spouse name: Not on file   Number of children: 1   Years of education: Not on file   Highest education level: Not on file  Occupational History   Not on file  Tobacco Use   Smoking status: Never   Smokeless tobacco: Never  Vaping Use   Vaping Use: Never used  Substance and Sexual Activity   Alcohol use: Never   Drug use: No   Sexual activity: Not on file  Other Topics Concern   Not on file  Social History Narrative   Lives with husband [name]. No children.    Social Determinants of Health   Financial Resource Strain: Low Risk    Difficulty of Paying Living Expenses: Not hard at all  Food Insecurity: No Food Insecurity   Worried About Charity fundraiser in the Last Year: Never true   [place] in the Last Year: Never true  Transportation Needs: No Transportation Needs   Lack of Transportation (Medical): No   Lack of Transportation (Non-Medical): No  Physical  Activity: Inactive   Days of Exercise per Week: 0 days   Minutes of Exercise per Session: 0 min  Stress: No Stress Concern Present   Feeling of Stress : Not at all  Social Connections: Not on file    Tobacco Counseling Counseling given: Not Answered   Clinical Intake:  Pre-visit preparation completed: Yes  Pain : 0-10 Pain Type: Chronic pain Pain Location: Shoulder Pain Orientation: Right Pain Descriptors / Indicators: Aching Pain Onset: More than a month ago Pain Frequency: Intermittent     Nutritional Risks: None Diabetes: No  How often do you need to have someone help you when you read instructions, pamphlets, or other written materials from your doctor or pharmacy?: 1 - Never  Diabetic: No Nutrition Risk Assessment:  Has the patient had any N/V/D within the last 2 months?  No  Does the patient have any non-healing wounds?  No  Has the  patient had any unintentional weight loss or weight gain?  No   Diabetes:  Is the patient diabetic?  No  If diabetic, was a CBG obtained today?   N/A Did the patient bring in their glucometer from home?   N/A How often do you monitor your CBG's? N/A.   Financial Strains and Diabetes Management:  Are you having any financial strains with the device, your supplies or your medication?  N/A .  Does the patient want to be seen by Chronic Care Management for management of their diabetes?   N/A Would the patient like to be referred to a Nutritionist or for Diabetic Management?   N/A   Interpreter Needed?: No Information entered by :: CJohnson, RN   Activities of Daily Living In your present state of health, do you have any difficulty performing the following activities: 03/15/2021  Hearing? Y  Comment wears hearing aids  Vision? N  Difficulty concentrating or making decisions? N  Walking or climbing stairs? N  Dressing or bathing? N  Doing errands, shopping? N  Preparing Food and eating ? N  Using the Toilet? N  In the past  six months, have you accidently leaked urine? Y  Comment wears pads  Do you have problems with loss of bowel control? N  Managing your Medications? N  Managing your Finances? N  Housekeeping or managing your Housekeeping? N  Some recent data might be hidden    Patient Care Team: [name], MD as PCP - General (Family Medicine)  Indicate any recent Medical Services you may have received from other than [facility] providers in the past year (date may be approximate).     Assessment:   This is a routine wellness examination for [place].  Hearing/Vision screen Vision Screening - Comments:: Patient gets annual eye exams   Dietary issues and exercise activities discussed: Current Exercise Habits: The patient does not participate in regular exercise at present, Exercise limited by: None identified   Goals Addressed             This Visit's Progress    Patient Stated       03/15/2021, I will maintain and continue medications as prescribed.        Depression Screen PHQ 2/9 Scores 03/15/2021 12/20/2019 10/13/2019 07/29/2018 06/23/2017 02/05/2017  PHQ - 2 Score 0 0 0 0 0 0  PHQ- 9 Score 0 0 4 - - -    Fall Risk Fall Risk  03/15/2021 12/20/2019 07/29/2018 06/23/2017  Falls in the past year? 1 0 0 No  Number falls in past yr: 0 - - -  Injury with Fall? 0 - - -  Risk for fall due to : Medication side effect - - -  Follow up Falls evaluation completed;Falls prevention discussed - - -    FALL RISK PREVENTION PERTAINING TO THE HOME:  Any stairs in or around the home? Yes  If so, are there any without handrails? No  Home free of loose throw rugs in walkways, pet beds, electrical cords, etc? Yes  Adequate lighting in your home to reduce risk of falls? Yes   ASSISTIVE DEVICES UTILIZED TO PREVENT FALLS:  Life alert? No  Use of a cane, walker or w/c? No  Grab bars in the bathroom? No  Shower chair or bench in shower? No  Elevated toilet seat or a handicapped toilet? No   TIMED UP  AND GO:  Was the test performed?  N/A virtual/telephone visit .    Cognitive Function:  MMSE - Mini Mental State Exam 03/15/2021  Orientation to time 5  Orientation to Place 5  Registration 3  Attention/ Calculation 5  Recall 3  Language- repeat 1       Mini Cog  Mini-Cog screen was completed. Maximum score is 22. A value of 0 denotes this part of the MMSE was not completed or the patient failed this part of the Mini-Cog screening.  Immunizations Immunization History  Administered Date(s) Administered   Influenza, High Dose Seasonal PF 08/30/2020   Pneumococcal Conjugate-13 02/14/2016   Td 01/23/2016   Zoster Recombinat (Shingrix) 10/13/2019, 01/15/2020    TDAP status: Up to date  Flu Vaccine status: Up to date  Pneumococcal vaccine status: Due, Education has been provided regarding the importance of this vaccine. Advised may receive this vaccine at local pharmacy or Health Dept. Aware to provide a copy of the vaccination record if obtained from local pharmacy or Health Dept. Verbalized acceptance and understanding.  Covid-19 vaccine status: Declined, Education has been provided regarding the importance of this vaccine but patient still declined. Advised may receive this vaccine at local pharmacy or Health Dept.or vaccine clinic. Aware to provide a copy of the vaccination record if obtained from local pharmacy or Health Dept. Verbalized acceptance and understanding.  Qualifies for Shingles Vaccine? Yes   Zostavax completed No   Shingrix Completed?: Yes  Screening Tests Health Maintenance  Topic Date Due   PNA vac Low Risk Adult (2 of 2 - PPSV23) 02/13/2017   COVID-19 Vaccine (1) 03/31/2021 (Originally 02/03/1951)   INFLUENZA VACCINE  04/02/2021   MAMMOGRAM  04/06/2021   Fecal DNA (Cologuard)  01/04/2023   TETANUS/TDAP  01/22/2026   DEXA SCAN  Completed   Hepatitis C Screening  Completed   Zoster Vaccines- Shingrix  Completed   HPV VACCINES  Aged Out    Health  Maintenance  Health Maintenance Due  Topic Date Due   PNA vac Low Risk Adult (2 of 2 - PPSV23) 02/13/2017    Colorectal cancer screening: due, will discuss with provider   Mammogram status: Completed 04/06/2020. Repeat every year  Bone Density status: Completed 04/04/2020. Results reflect: Bone density results: OSTEOPOROSIS. Repeat every 2 years.  Lung Cancer Screening: (Low Dose CT Chest recommended if Age 38-80 years, 30 pack-year currently smoking OR have quit w/in 15 years.) does not qualify.    Additional Screening:  Hepatitis C Screening: does qualify; Completed 07/19/2015  Vision Screening: Recommended annual ophthalmology exams for early detection of glaucoma and other disorders of the eye. Is the patient up to date with their annual eye exam?  Yes  Who is the provider or what is the name of the office in which the patient attends annual eye exams? [place] If pt is not established with a provider, would they like to be referred to a provider to establish care? No .   Dental Screening: Recommended annual dental exams for proper oral hygiene  Community Resource Referral / Chronic Care Management: CRR required this visit?  No   CCM required this visit?  No      Plan:     I have personally reviewed and noted the following in the patient's chart:   Medical and social history Use of alcohol, tobacco or illicit drugs  Current medications and supplements including opioid prescriptions.  Functional ability and status Nutritional status Physical activity Advanced directives List of other physicians Hospitalizations, surgeries, and ER visits in previous 12 months Vitals Screenings to include cognitive, depression,  and falls Referrals and appointments  In addition, I have reviewed and discussed with patient certain preventive protocols, quality metrics, and best practice recommendations. A written personalized care plan for preventive services as well as  general preventive health recommendations were provided to patient.   Due to this being a virtual/telephonic visit, the after visit summary with patients personalized plan was offered to patient via office or my-chart. Patient preferred to pick up at office at next visit or via mychart.   Andrez Grime, LPN   [id number]/[id number]

## 2021-03-15 NOTE — Progress Notes (Signed)
PCP notes:  Health Maintenance: Pneumococcal 23- due Colonoscopy- due Covid- declined   Abnormal Screenings: none   Patient concerns: Elevated blood pressure readings  Heart burn Shoulder/neck pain Referral to Dr. Trenton Gammon Sleeping issues   Nurse concerns: none   Next PCP appt.: 03/23/2021 @ 3 pm

## 2021-03-16 ENCOUNTER — Other Ambulatory Visit: Payer: Medicare Other

## 2021-03-19 ENCOUNTER — Other Ambulatory Visit (INDEPENDENT_AMBULATORY_CARE_PROVIDER_SITE_OTHER): Payer: Medicare Other

## 2021-03-19 ENCOUNTER — Other Ambulatory Visit: Payer: Self-pay

## 2021-03-19 DIAGNOSIS — E876 Hypokalemia: Secondary | ICD-10-CM | POA: Diagnosis not present

## 2021-03-19 DIAGNOSIS — E871 Hypo-osmolality and hyponatremia: Secondary | ICD-10-CM | POA: Diagnosis not present

## 2021-03-19 DIAGNOSIS — E7800 Pure hypercholesterolemia, unspecified: Secondary | ICD-10-CM | POA: Diagnosis not present

## 2021-03-19 DIAGNOSIS — B0229 Other postherpetic nervous system involvement: Secondary | ICD-10-CM | POA: Diagnosis not present

## 2021-03-19 LAB — CBC WITH DIFFERENTIAL/PLATELET
Basophils Absolute: 0 10*3/uL (ref 0.0–0.1)
Basophils Relative: 0.7 % (ref 0.0–3.0)
Eosinophils Absolute: 0.2 10*3/uL (ref 0.0–0.7)
Eosinophils Relative: 2.8 % (ref 0.0–5.0)
HCT: 38.7 % (ref 36.0–46.0)
Hemoglobin: 12.8 g/dL (ref 12.0–15.0)
Lymphocytes Relative: 34.4 % (ref 12.0–46.0)
Lymphs Abs: 2 10*3/uL (ref 0.7–4.0)
MCHC: 33.1 g/dL (ref 30.0–36.0)
MCV: 86.9 fl (ref 78.0–100.0)
Monocytes Absolute: 0.7 10*3/uL (ref 0.1–1.0)
Monocytes Relative: 12 % (ref 3.0–12.0)
Neutro Abs: 2.9 10*3/uL (ref 1.4–7.7)
Neutrophils Relative %: 50.1 % (ref 43.0–77.0)
Platelets: 371 10*3/uL (ref 150.0–400.0)
RBC: 4.45 Mil/uL (ref 3.87–5.11)
RDW: 13.4 % (ref 11.5–15.5)
WBC: 5.7 10*3/uL (ref 4.0–10.5)

## 2021-03-19 LAB — BASIC METABOLIC PANEL
BUN: 10 mg/dL (ref 6–23)
CO2: 29 mEq/L (ref 19–32)
Calcium: 9.8 mg/dL (ref 8.4–10.5)
Chloride: 104 mEq/L (ref 96–112)
Creatinine, Ser: 0.81 mg/dL (ref 0.40–1.20)
GFR: 71.16 mL/min (ref >60.00)
Glucose, Bld: 103 mg/dL — ABNORMAL HIGH (ref 70–99)
Potassium: 4.3 mEq/L (ref 3.5–5.1)
Sodium: 139 mEq/L (ref 135–145)

## 2021-03-19 LAB — LIPID PANEL
Cholesterol: 254 mg/dL — ABNORMAL HIGH (ref 0–200)
HDL: 38.1 mg/dL — ABNORMAL LOW (ref >39.00)
NonHDL: 216.25
Total CHOL/HDL Ratio: 7
Triglycerides: 232 mg/dL — ABNORMAL HIGH (ref 0.0–149.0)
VLDL: 46.4 mg/dL — ABNORMAL HIGH (ref 0.0–40.0)

## 2021-03-19 LAB — LDL CHOLESTEROL, DIRECT: Direct LDL: 165 mg/dL

## 2021-03-19 LAB — TSH: TSH: 1.73 u[IU]/mL (ref 0.35–5.50)

## 2021-03-19 LAB — T4, FREE: Free T4: 1.45 ng/dL (ref 0.60–1.60)

## 2021-03-23 ENCOUNTER — Encounter: Payer: Self-pay | Admitting: Family Medicine

## 2021-03-23 ENCOUNTER — Ambulatory Visit (INDEPENDENT_AMBULATORY_CARE_PROVIDER_SITE_OTHER): Payer: Medicare Other | Admitting: Family Medicine

## 2021-03-23 ENCOUNTER — Other Ambulatory Visit: Payer: Self-pay

## 2021-03-23 VITALS — BP 160/92 | HR 84 | Temp 97.6°F | Ht 60.0 in | Wt 121.0 lb

## 2021-03-23 DIAGNOSIS — I6782 Cerebral ischemia: Secondary | ICD-10-CM | POA: Diagnosis not present

## 2021-03-23 DIAGNOSIS — M810 Age-related osteoporosis without current pathological fracture: Secondary | ICD-10-CM

## 2021-03-23 DIAGNOSIS — M47812 Spondylosis without myelopathy or radiculopathy, cervical region: Secondary | ICD-10-CM | POA: Diagnosis not present

## 2021-03-23 DIAGNOSIS — M25511 Pain in right shoulder: Secondary | ICD-10-CM | POA: Diagnosis not present

## 2021-03-23 DIAGNOSIS — E871 Hypo-osmolality and hyponatremia: Secondary | ICD-10-CM | POA: Diagnosis not present

## 2021-03-23 DIAGNOSIS — F5104 Psychophysiologic insomnia: Secondary | ICD-10-CM

## 2021-03-23 DIAGNOSIS — E876 Hypokalemia: Secondary | ICD-10-CM | POA: Diagnosis not present

## 2021-03-23 DIAGNOSIS — R7989 Other specified abnormal findings of blood chemistry: Secondary | ICD-10-CM

## 2021-03-23 DIAGNOSIS — Z7189 Other specified counseling: Secondary | ICD-10-CM

## 2021-03-23 DIAGNOSIS — E7800 Pure hypercholesterolemia, unspecified: Secondary | ICD-10-CM | POA: Diagnosis not present

## 2021-03-23 DIAGNOSIS — I10 Essential (primary) hypertension: Secondary | ICD-10-CM

## 2021-03-23 DIAGNOSIS — M542 Cervicalgia: Secondary | ICD-10-CM | POA: Diagnosis not present

## 2021-03-23 DIAGNOSIS — K635 Polyp of colon: Secondary | ICD-10-CM | POA: Diagnosis not present

## 2021-03-23 MED ORDER — ATORVASTATIN CALCIUM 10 MG PO TABS: 10.0000 mg | ORAL_TABLET | Freq: Every day | ORAL | 3 refills | Status: DC

## 2021-03-23 MED ORDER — AMLODIPINE BESYLATE 5 MG PO TABS: 5.0000 mg | ORAL_TABLET | Freq: Every day | ORAL | 3 refills | Status: DC

## 2021-03-23 MED ORDER — FAMOTIDINE 20 MG PO TABS: 20.0000 mg | ORAL_TABLET | Freq: Every day | ORAL | Status: DC

## 2021-03-23 MED ORDER — PROPRANOLOL HCL ER 60 MG PO CP24: 60.0000 mg | ORAL_CAPSULE | Freq: Every day | ORAL | 3 refills | Status: DC

## 2021-03-23 MED ORDER — POTASSIUM CHLORIDE CRYS ER 20 MEQ PO TBCR
20.0000 meq | EXTENDED_RELEASE_TABLET | Freq: Every day | ORAL | 3 refills | Status: DC
Start: 2021-03-23 — End: 2022-04-09

## 2021-03-23 MED ORDER — LOSARTAN POTASSIUM 100 MG PO TABS: 100.0000 mg | ORAL_TABLET | Freq: Every day | ORAL | 3 refills | Status: DC

## 2021-03-23 NOTE — Patient Instructions (Addendum)
Restart propranolol ER to '60mg'$  daily.  Start amlodipine '5mg'$  daily.  Restart atorvastatin for cholesterol.  Continue losartan '100mg'$  daily.  Go to [facility] for neck xrays.  Advanced directive packet provided today as an example of another form.  Return in 1 month for follow up visit.  We will refer you back to GI.  Do exercises provided today. May use tylenol as needed for pain, as well as ice or heating pad.   Health Maintenance After Age 75 After age 30, you are at a higher risk for certain long-term diseases and infections as well as injuries from falls. Falls are a major cause of broken bones and head injuries in people who are older than age 61. Getting regular preventive care can help to keep you healthy and well. Preventive care includes getting regular testing and making lifestyle changes as recommended by your health care provider. Talk with your health care provider about: Which screenings and tests you should have. A screening is a test that checks for a disease when you have no symptoms. A diet and exercise plan that is right for you. What should I know about screenings and tests to prevent falls? Screening and testing are the best ways to find a health problem early. Early diagnosis and treatment give you the best chance of managing medical conditions that are common after age 48. Certain conditions and lifestyle choices may make you more likely to have a fall. Your health care provider may recommend: Regular vision checks. Poor vision and conditions such as cataracts can make you more likely to have a fall. If you wear glasses, make sure to get your prescription updated if your vision changes. Medicine review. Work with your health care provider to regularly review all of the medicines you are taking, including over-the-counter medicines. Ask your health care provider about any side effects that may make you more likely to have a fall. Tell your health care provider if any medicines that  you take make you feel dizzy or sleepy. Osteoporosis screening. Osteoporosis is a condition that causes the bones to get weaker. This can make the bones weak and cause them to break more easily. Blood pressure screening. Blood pressure changes and medicines to control blood pressure can make you feel dizzy. Strength and balance checks. Your health care provider may recommend certain tests to check your strength and balance while standing, walking, or changing positions. Foot health exam. Foot pain and numbness, as well as not wearing proper footwear, can make you more likely to have a fall. Depression screening. You may be more likely to have a fall if you have a fear of falling, feel emotionally low, or feel unable to do activities that you used to do. Alcohol use screening. Using too much alcohol can affect your balance and may make you more likely to have a fall. What actions can I take to lower my risk of falls? General instructions Talk with your health care provider about your risks for falling. Tell your health care provider if: You fall. Be sure to tell your health care provider about all falls, even ones that seem minor. You feel dizzy, sleepy, or off-balance. Take over-the-counter and prescription medicines only as told by your health care provider. These include any supplements. Eat a healthy diet and maintain a healthy weight. A healthy diet includes low-fat dairy products, low-fat (lean) meats, and fiber from whole grains, beans, and lots of fruits and vegetables. Home safety Remove any tripping hazards, such as rugs,  cords, and clutter. Install safety equipment such as grab bars in bathrooms and safety rails on stairs. Keep rooms and walkways well-lit. Activity  Follow a regular exercise program to stay fit. This will help you maintain your balance. Ask your health care provider what types of exercise are appropriate for you. If you need a cane or walker, use it as recommended by  your health care provider. Wear supportive shoes that have nonskid soles.  Lifestyle Do not drink alcohol if your health care provider tells you not to drink. If you drink alcohol, limit how much you have: 0-1 drink a day for women. 0-2 drinks a day for men. Be aware of how much alcohol is in your drink. In the [place] one drink equals one typical bottle of beer (12 oz), one-half glass of wine (5 oz), or one shot of hard liquor (1 oz). Do not use any products that contain nicotine or tobacco, such as cigarettes and e-cigarettes. If you need help quitting, ask your health care provider. Summary Having a healthy lifestyle and getting preventive care can help to protect your health and wellness after age 22. Screening and testing are the best way to find a health problem early and help you avoid having a fall. Early diagnosis and treatment give you the best chance for managing medical conditions that are more common for people who are older than age 52. Falls are a major cause of broken bones and head injuries in people who are older than age 6. Take precautions to prevent a fall at home. Work with your health care provider to learn what changes you can make to improve your health and wellness and to prevent falls. This information is not intended to replace advice given to you by your health care provider. Make sure you discuss any questions you have with your healthcare provider. Document Revised: 08/04/2020 Document Reviewed: 08/04/2020 Elsevier Patient Education  2022 [employer].

## 2021-03-23 NOTE — Progress Notes (Signed)
Patient ID: Robin Peck, female    DOB: 15-Feb-1946, 75 y.o.   MRN: [id number]  This visit was conducted in person.  BP (!) 160/92 (BP Location: Left Arm, Patient Position: Sitting, Cuff Size: Normal)   Pulse 84   Temp 97.6 F (36.4 C) (Temporal)   Ht 5' (1.524 m)   Wt 121 lb (54.9 kg)   SpO2 99%   BMI 23.63 kg/m    CC: AMW f/u visit  Subjective:   HPI: Robin Peck is a 75 y.o. female presenting on 03/23/2021 for Medicare Wellness   Saw health advisor last week for medicare wellness visit. Note reviewed.   No results found.  Flowsheet Row Clinical Support from 03/15/2021 in [place] at [place]  PHQ-2 Total Score 0       Fall Risk  03/15/2021 12/20/2019 07/29/2018 06/23/2017  Falls in the past year? 1 0 0 No  Number falls in past yr: 0 - - -  Injury with Fall? 0 - - -  Risk for fall due to : Medication side effect - - -  Follow up Falls evaluation completed;Falls prevention discussed - - -   Recent hospitalization for hyponatremia in setting of COVID-19 illness. Notes ongoing fatigue since then.  Hyponatremia - fluid restricted until normalized. Recent levels back to normal.  Chronic hypokalemia - long term on potassium supplementation, currently taking 53mq BID. Renin/aldosterone levels pending.  Atorvastatin stopped during hospitalization.   R shoulder pain - may have started after one of her her falls during hyponatremia - well controlled when taking prednisone during hospitalization. Managing with rubbing cream or tylenol with benefit.   Insomnia - managing with xanax PRN. Trazodone, melatonin was not helpful.   HTN - back on losartan '100mg'$  daily, HCTZ was stopped. She has restarted taking propranolol ER '80mg'$  daily but interested in dropping dose to '60mg'$ . BP recently elevated. 175/90 yesterday at dentist office.   Strong fmhx CAD, CVA.   Asks about referral back to neurosurgery ([facility] Ongoing R neck pain which can cause headache. She finds  cervical traction helps symptoms. No shooting pain down arms or numbness/weakness down arms. Previously saw Dr PAnnette Stable>5 yrs ago.   Preventative: Cologuard positive last year - Colonoscopy 03/2020 - 3.5 cm polyp - rec return for removal Colonoscopy 06/2020 - full piecemeal removal - SSP and TA - rec repeat colonoscopy 6-9 months ([facility]) Lung cancer screening - not eligible Mammogram 04/2020 Birads 1 @ [facility] Well woman exam - >10 yrs ago - has not seen GYN recently  DEXA 04/2020: T score -2.7 spine, -2.6 L hip (osteoporosis) - has made healthy diet choices. Discussed weight bearing exercises. Declines medication to treat this.  Flu shot - 08/2020 Prevnar-13 2017 - got sick with this. [place] open to pneumovax23 Td 2017  Shingrix - 10/2019, 01/2020 COVID vaccine - declines  Advanced directive discussion - has this at home and husband is HCPOA, however in process of revising. Packet provided today.   Seat belt use discussed Sunscreen use discussed. No changing moles on skin. Non smoker Alcohol - none Dentist - yearly  Eye exam - due  Bowel - no constipation Bladder - no bothersome incontinence   Lives with husband JSonia Side      Relevant past medical, surgical, family and social history reviewed and updated as indicated. Interim medical history since our last visit reviewed. Allergies and medications reviewed and updated. Outpatient Medications Prior to Visit  Medication Sig Dispense Refill  ALPRAZolam (XANAX) 0.25 MG tablet Take 1 tablet (0.25 mg total) by mouth 2 (two) times daily as needed for anxiety or sleep. 30 tablet 0   Calcium Carb-Cholecalciferol (CALCIUM-VITAMIN D) 600-400 MG-UNIT TABS Take 1 tablet by mouth daily.     losartan (COZAAR) 100 MG tablet Take 1 tablet (100 mg total) by mouth daily. 90 tablet 1   potassium chloride SA (KLOR-CON M20) 20 MEQ tablet Take 0.5 tablets (10 mEq total) by mouth 2 (two) times daily. 30 tablet 3   albuterol (VENTOLIN HFA) 108  (90 Base) MCG/ACT inhaler Inhale 2 puffs into the lungs every 6 (six) hours as needed for wheezing or shortness of breath. 6.7 g 0   famotidine (PEPCID) 20 MG tablet Take 1 tablet (20 mg total) by mouth daily for 14 days. 14 tablet 0   No facility-administered medications prior to visit.     Per HPI unless specifically indicated in ROS section below Review of Systems  Objective:  BP (!) 160/92 (BP Location: Left Arm, Patient Position: Sitting, Cuff Size: Normal)   Pulse 84   Temp 97.6 F (36.4 C) (Temporal)   Ht 5' (1.524 m)   Wt 121 lb (54.9 kg)   SpO2 99%   BMI 23.63 kg/m   Wt Readings from Last 3 Encounters:  03/23/21 121 lb (54.9 kg)  02/16/21 118 lb 1 oz (53.6 kg)  02/08/21 120 lb (54.4 kg)      Physical Exam Vitals and nursing note reviewed.  Constitutional:      Appearance: Normal appearance. She is not ill-appearing.  HENT:     Head: Normocephalic and atraumatic.     Right Ear: Tympanic membrane, ear canal and external ear normal. There is no impacted cerumen.     Left Ear: Tympanic membrane, ear canal and external ear normal. There is no impacted cerumen.  Eyes:     General:        Right eye: No discharge.        Left eye: No discharge.     Extraocular Movements: Extraocular movements intact.     Conjunctiva/sclera: Conjunctivae normal.     Pupils: Pupils are equal, round, and reactive to light.  Neck:     Thyroid: No thyroid mass or thyromegaly.     Comments:  No midline cervical spine tenderness  Paraspinous mm discomfort  Cardiovascular:     Rate and Rhythm: Normal rate and regular rhythm.     Pulses: Normal pulses.     Heart sounds: Normal heart sounds. No murmur heard. Pulmonary:     Effort: Pulmonary effort is normal. No respiratory distress.     Breath sounds: Normal breath sounds. No wheezing, rhonchi or rales.  Abdominal:     General: Bowel sounds are normal. There is no distension.     Palpations: Abdomen is soft. There is no mass.      Tenderness: There is no abdominal tenderness. There is no guarding or rebound.     Hernia: No hernia is present.  Musculoskeletal:     Cervical back: Normal range of motion and neck supple. No rigidity.     Right lower leg: No edema.     Left lower leg: No edema.     Comments:  L shoulder WNL R shoulder exam: No deformity of shoulders on inspection. No pain with palpation of shoulder landmarks. FROM in abduction and forward flexion, discomfort past 90 degrees on both tests. Discomfort with testing SITS in ext/int rotation. + pain with empty can  sign. + Speed test. No impingement. No pain with rotation of humeral head in [facility] joint.   Lymphadenopathy:     Cervical: No cervical adenopathy.  Skin:    General: Skin is warm and dry.     Findings: No rash.  Neurological:     General: No focal deficit present.     Mental Status: She is alert. Mental status is at baseline.  Psychiatric:        Mood and Affect: Mood normal.        Behavior: Behavior normal.      Results for orders placed or performed in visit on 123456  Basic metabolic panel  Result Value Ref Range   Sodium 139 135 - 145 mEq/L   Potassium 4.3 3.5 - 5.1 mEq/L   Chloride 104 96 - 112 mEq/L   CO2 29 19 - 32 mEq/L   Glucose, Bld 103 (H) 70 - 99 mg/dL   BUN 10 6 - 23 mg/dL   Creatinine, Ser 0.81 0.40 - 1.20 mg/dL   GFR 71.16 >60.00 mL/min   Calcium 9.8 8.4 - 10.5 mg/dL  CBC with Differential/Platelet  Result Value Ref Range   WBC 5.7 4.0 - 10.5 K/uL   RBC 4.45 3.87 - 5.11 Mil/uL   Hemoglobin 12.8 12.0 - 15.0 g/dL   HCT 38.7 36.0 - 46.0 %   MCV 86.9 78.0 - 100.0 fl   MCHC 33.1 30.0 - 36.0 g/dL   RDW 13.4 11.5 - 15.5 %   Platelets 371.0 150.0 - 400.0 K/uL   Neutrophils Relative % 50.1 43.0 - 77.0 %   Lymphocytes Relative 34.4 12.0 - 46.0 %   Monocytes Relative 12.0 3.0 - 12.0 %   Eosinophils Relative 2.8 0.0 - 5.0 %   Basophils Relative 0.7 0.0 - 3.0 %   Neutro Abs 2.9 1.4 - 7.7 K/uL   Lymphs Abs 2.0 0.7 -  4.0 K/uL   Monocytes Absolute 0.7 0.1 - 1.0 K/uL   Eosinophils Absolute 0.2 0.0 - 0.7 K/uL   Basophils Absolute 0.0 0.0 - 0.1 K/uL  T4, free  Result Value Ref Range   Free T4 1.45 0.60 - 1.60 ng/dL  TSH  Result Value Ref Range   TSH 1.73 0.35 - 5.50 uIU/mL  Lipid panel  Result Value Ref Range   Cholesterol 254 (H) 0 - 200 mg/dL   Triglycerides 232.0 (H) 0.0 - 149.0 mg/dL   HDL 38.10 (L) >39.00 mg/dL   VLDL 46.4 (H) 0.0 - 40.0 mg/dL   Total CHOL/HDL Ratio 7    NonHDL 216.25   LDL cholesterol, direct  Result Value Ref Range   Direct LDL 165.0 mg/dL    Assessment & Plan:  This visit occurred during the SARS-CoV-2 public health emergency.  Safety protocols were in place, including screening questions prior to the visit, additional usage of staff PPE, and extensive cleaning of exam room while observing appropriate contact time as indicated for disinfecting solutions.   Problem List Items Addressed This Visit     HLD (hyperlipidemia)    Back on atorvastatin '10mg'$  daily - continue, consider increased dose if lipid levels remain this elevated. . The 10-year ASCVD risk score Mikey Bussing DC Jr., et al., 2013) is: 31.4%   Values used to calculate the score:     Age: 50 years     Sex: Female     Is Non-Hispanic African American: No     Diabetic: No     Tobacco smoker: No  Systolic Blood Pressure: 0000000 mmHg     Is BP treated: Yes     HDL Cholesterol: 38.1 mg/dL     Total Cholesterol: 254 mg/dL        Relevant Medications   propranolol ER (INDERAL LA) 60 MG 24 hr capsule   amLODipine (NORVASC) 5 MG tablet   losartan (COZAAR) 100 MG tablet   atorvastatin (LIPITOR) 10 MG tablet   Essential hypertension    Chronic, deteriorated since hctz and propranolol were stopped. Continues full dose losartan. Will restart propranolol ER lower dose '60mg'$  daily given endorsing fatigue with prior '80mg'$  dose, add amlodipine '5mg'$  daily given elevated readings recently. Continue to avoid HCTZ in hyponatremia  history.  I did ask her to return to see me in 1 month for HTN f/u visit.        Relevant Medications   propranolol ER (INDERAL LA) 60 MG 24 hr capsule   amLODipine (NORVASC) 5 MG tablet   losartan (COZAAR) 100 MG tablet   atorvastatin (LIPITOR) 10 MG tablet   DJD (degenerative joint disease) of cervical spine   Relevant Orders   DG Cervical Spine Complete   Chronic hypokalemia    Aldosterone levels pending. Has been taking 80mq BID - suggested 235m once daily.        Cervical pain (neck)    Longstanding, associated with headache - anticipate cervicogenic. No red flags. Previously saw neurosurgery. Will update cervical films.        Relevant Orders   DG Cervical Spine Complete   Chronic insomnia    Ongoing struggle. Continues xanax prn. Trazodone, melatonin not helpful       Osteoporosis    Continue cal/vit D dosing. Not interested in osteoporosis medication at this time.  Reviewed different treatment options available.        Advanced care planning/counseling discussion - Primary    Advanced directive discussion - has this at home and husband is HCPOA, however in process of revising. Packet provided today.        Cecal polyp    Overdue for colonoscopy f/u - referral placed.        Relevant Orders   Ambulatory referral to Gastroenterology   Hyponatremia    Na back to normal - will liberalize fluid intake.        Abnormal TSH    TSH, free T4 on retesting returned normal. Anticipate sick euthyroid.        Right shoulder pain    Story/exam most consistent with RTC and biceps tendonitis, did improve with prednisone course while hospitalized. [facility], as well as tylenol, heating pad, ice. If not improved, consider PT vs ortho eval.         Ischemic changes on computed tomography of head    Discussed head CT findings from hospitalization - pt was worried about possibility of development of dementia. Reassured on evidence  at this time for memory impairment, encouraged healthy diet and lifestyle choices to decrease risk of dementia. Pt agrees.          Meds ordered this encounter  Medications   propranolol ER (INDERAL LA) 60 MG 24 hr capsule    Sig: Take 1 capsule (60 mg total) by mouth daily.    Dispense:  90 capsule    Refill:  3   amLODipine (NORVASC) 5 MG tablet    Sig: Take 1 tablet (5 mg total) by mouth daily.    Dispense:  90 tablet  Refill:  3   losartan (COZAAR) 100 MG tablet    Sig: Take 1 tablet (100 mg total) by mouth daily.    Dispense:  90 tablet    Refill:  3   famotidine (PEPCID) 20 MG tablet    Sig: Take 1 tablet (20 mg total) by mouth at bedtime.   atorvastatin (LIPITOR) 10 MG tablet    Sig: Take 1 tablet (10 mg total) by mouth daily.    Dispense:  90 tablet    Refill:  3   potassium chloride SA (KLOR-CON M20) 20 MEQ tablet    Sig: Take 1 tablet (20 mEq total) by mouth daily.    Dispense:  90 tablet    Refill:  3    Orders Placed This Encounter  Procedures   DG Cervical Spine Complete    Standing Status:   Future    Standing Expiration Date:   03/23/2022    Order Specific Question:   Reason for Exam (SYMPTOM  OR DIAGNOSIS REQUIRED)    Answer:   neck pain    Order Specific Question:   Preferred imaging location?    Answer:   [facility]   Ambulatory referral to Gastroenterology    Referral Priority:   Routine    Referral Type:   Consultation    Referral Reason:   Specialty Services Required    Number of Visits Requested:   1     Patient instructions: Restart propranolol ER to '60mg'$  daily.  Start amlodipine '5mg'$  daily.  Restart atorvastatin for cholesterol.  Continue losartan '100mg'$  daily.  Go to [place] for neck xrays.  Advanced directive packet provided today as an example of another form.  Return in 1 month for follow up visit.  We will refer you back to GI.  Do exercises provided today. May use tylenol as needed for pain, as well as ice or heating pad.    Follow up plan: Return in about 4 weeks (around 04/20/2021) for follow up visit.  [name], MD

## 2021-03-25 DIAGNOSIS — I6782 Cerebral ischemia: Secondary | ICD-10-CM | POA: Insufficient documentation

## 2021-03-25 DIAGNOSIS — M25511 Pain in right shoulder: Secondary | ICD-10-CM | POA: Insufficient documentation

## 2021-03-25 NOTE — Assessment & Plan Note (Signed)
TSH, free T4 on retesting returned normal. Anticipate sick euthyroid.

## 2021-03-25 NOTE — Assessment & Plan Note (Signed)
Aldosterone levels pending. Has been taking 52mq BID - suggested 235m once daily.

## 2021-03-25 NOTE — Assessment & Plan Note (Signed)
Advanced directive discussion - has this at home and husband is HCPOA, however in process of revising. Packet provided today.

## 2021-03-25 NOTE — Assessment & Plan Note (Addendum)
Ongoing struggle. Continues xanax prn. Trazodone, melatonin not helpful

## 2021-03-25 NOTE — Assessment & Plan Note (Signed)
Discussed head CT findings from hospitalization - pt was worried about possibility of development of dementia. Reassured on evidence at this time for memory impairment, encouraged healthy diet and lifestyle choices to decrease risk of dementia. Pt agrees.

## 2021-03-25 NOTE — Assessment & Plan Note (Signed)
Overdue for colonoscopy f/u - referral placed.

## 2021-03-25 NOTE — Assessment & Plan Note (Signed)
Na back to normal - will liberalize fluid intake.

## 2021-03-25 NOTE — Assessment & Plan Note (Signed)
Continue cal/vit D dosing. Not interested in osteoporosis medication at this time.  Reviewed different treatment options available.

## 2021-03-25 NOTE — Assessment & Plan Note (Addendum)
Chronic, deteriorated since hctz and propranolol were stopped. Continues full dose losartan. Will restart propranolol ER lower dose '60mg'$  daily given endorsing fatigue with prior '80mg'$  dose, add amlodipine '5mg'$  daily given elevated readings recently. Continue to avoid HCTZ in hyponatremia history.  I did ask her to return to see me in 1 month for HTN f/u visit.

## 2021-03-25 NOTE — Assessment & Plan Note (Addendum)
Longstanding, associated with headache - anticipate cervicogenic. No red flags. Previously saw neurosurgery. Will update cervical films.

## 2021-03-25 NOTE — Assessment & Plan Note (Signed)
Back on atorvastatin '10mg'$  daily - continue, consider increased dose if lipid levels remain this elevated. . The 10-year ASCVD risk score Mikey Bussing DC Brooke Bonito., et al., 2013) is: 31.4%   Values used to calculate the score:     Age: 75 years     Sex: Female     Is Non-Hispanic African American: No     Diabetic: No     Tobacco smoker: No     Systolic Blood Pressure: 0000000 mmHg     Is BP treated: Yes     HDL Cholesterol: 38.1 mg/dL     Total Cholesterol: 254 mg/dL

## 2021-03-25 NOTE — Assessment & Plan Note (Signed)
Story/exam most consistent with RTC and biceps tendonitis, did improve with prednisone course while hospitalized. Provided with exercises from [facility] pt advisor, as well as tylenol, heating pad, ice. If not improved, consider PT vs ortho eval.

## 2021-03-26 ENCOUNTER — Ambulatory Visit
Admission: RE | Admit: 2021-03-26 | Discharge: 2021-03-26 | Disposition: A | Discharge status: Home / Self Care | Payer: Medicare Other | Source: Ambulatory Visit | Attending: Family Medicine | Admitting: Family Medicine

## 2021-03-26 ENCOUNTER — Ambulatory Visit
Admission: RE | Admit: 2021-03-26 | Discharge: 2021-03-26 | Disposition: A | Discharge status: Home / Self Care | Payer: Medicare Other | Attending: Family Medicine | Admitting: Family Medicine

## 2021-03-26 DIAGNOSIS — M542 Cervicalgia: Secondary | ICD-10-CM

## 2021-03-26 DIAGNOSIS — M47812 Spondylosis without myelopathy or radiculopathy, cervical region: Secondary | ICD-10-CM

## 2021-03-27 LAB — ALDOSTERONE + RENIN ACTIVITY W/ RATIO
ALDO / PRA Ratio: 15.6 Ratio (ref 0.9–28.9)
Aldosterone: 10 ng/dL
Renin Activity: 0.64 ng/mL/h (ref 0.25–5.82)

## 2021-04-09 ENCOUNTER — Other Ambulatory Visit: Payer: Self-pay | Admitting: Family Medicine

## 2021-04-09 DIAGNOSIS — Z1231 Encounter for screening mammogram for malignant neoplasm of breast: Secondary | ICD-10-CM

## 2021-04-10 DIAGNOSIS — M4802 Spinal stenosis, cervical region: Secondary | ICD-10-CM | POA: Diagnosis not present

## 2021-04-16 ENCOUNTER — Other Ambulatory Visit: Payer: Self-pay | Admitting: Family Medicine

## 2021-04-17 DIAGNOSIS — M4802 Spinal stenosis, cervical region: Secondary | ICD-10-CM | POA: Diagnosis not present

## 2021-04-17 DIAGNOSIS — M47812 Spondylosis without myelopathy or radiculopathy, cervical region: Secondary | ICD-10-CM | POA: Diagnosis not present

## 2021-04-19 ENCOUNTER — Ambulatory Visit
Admission: RE | Admit: 2021-04-19 | Discharge: 2021-04-19 | Disposition: A | Discharge status: Home / Self Care | Payer: Medicare Other | Source: Ambulatory Visit | Attending: Family Medicine | Admitting: Family Medicine

## 2021-04-19 ENCOUNTER — Other Ambulatory Visit: Payer: Self-pay

## 2021-04-19 DIAGNOSIS — Z1231 Encounter for screening mammogram for malignant neoplasm of breast: Secondary | ICD-10-CM | POA: Diagnosis not present

## 2021-04-23 ENCOUNTER — Ambulatory Visit: Payer: Medicare Other | Admitting: Family Medicine

## 2021-04-30 ENCOUNTER — Other Ambulatory Visit: Payer: Self-pay

## 2021-04-30 ENCOUNTER — Encounter: Payer: Self-pay | Admitting: Family Medicine

## 2021-04-30 ENCOUNTER — Ambulatory Visit (INDEPENDENT_AMBULATORY_CARE_PROVIDER_SITE_OTHER): Payer: Medicare Other | Admitting: Family Medicine

## 2021-04-30 VITALS — BP 126/66 | HR 71 | Temp 97.4°F | Ht 60.0 in | Wt 122.2 lb

## 2021-04-30 DIAGNOSIS — M542 Cervicalgia: Secondary | ICD-10-CM | POA: Diagnosis not present

## 2021-04-30 DIAGNOSIS — I10 Essential (primary) hypertension: Secondary | ICD-10-CM

## 2021-04-30 DIAGNOSIS — F064 Anxiety disorder due to known physiological condition: Secondary | ICD-10-CM

## 2021-04-30 DIAGNOSIS — B0229 Other postherpetic nervous system involvement: Secondary | ICD-10-CM | POA: Diagnosis not present

## 2021-04-30 DIAGNOSIS — F410 Panic disorder [episodic paroxysmal anxiety] without agoraphobia: Secondary | ICD-10-CM

## 2021-04-30 DIAGNOSIS — E876 Hypokalemia: Secondary | ICD-10-CM

## 2021-04-30 MED ORDER — ZINC 30 MG PO CAPS: 1.0000 | ORAL_CAPSULE | Freq: Every day | ORAL | Status: AC

## 2021-04-30 MED ORDER — CETIRIZINE HCL 10 MG PO TABS: 10.0000 mg | ORAL_TABLET | Freq: Every day | ORAL | Status: AC | PRN

## 2021-04-30 NOTE — Assessment & Plan Note (Signed)
Remote.  Has been off lyrica for the past 3 months, tolerating well.

## 2021-04-30 NOTE — Assessment & Plan Note (Signed)
Chronic, great control on current regimen - continue. 

## 2021-04-30 NOTE — Progress Notes (Signed)
Patient ID: Robin Peck, female    DOB: Dec 02, 1945, 75 y.o.   MRN: [id number]  This visit was conducted in person.  BP 126/66 (BP Location: Right Arm, Cuff Size: Normal)   Pulse 71   Temp (!) 97.4 F (36.3 C) (Temporal)   Ht 5' (1.524 m)   Wt 122 lb 4 oz (55.5 kg)   SpO2 98%   BMI 23.88 kg/m    CC: HTN f/u visit  Subjective:   HPI: Robin Peck is a 75 y.o. female presenting on 04/30/2021 for Hypertension (Here for 1 mo f/u.)   See prior note for details.  Chronic hypokalemia - long term potassium supplementation, currently on 26mq QD. Renin/aldosterone levels were recently normal.   HTN - Compliant with current antihypertensive regimen of losartan '100mg'$  daily, amlodipine '5mg'$  daily (new), propranolol ER '60mg'$  daily. Avoid HCTZ in h/o severe hyponatremia. Does check blood pressures at home daily: 130-140s/60-70s, higher when having chronic neck pain flare. No low blood pressure readings or symptoms of dizziness/syncope.  Denies HA, vision changes, CP/tightness, SOB, leg swelling.    She is sleeping better since propranolol dose was decreased. She has also decreased caffeine intake. Not needing anxiety med as much (xanax). Averaging 7 hours of sleep at night.   Off pregabalin since 01/2021!  Upcoming neurosurgery appt ([facility]) this week to review recent neck MRI - to discuss treatment options.      Relevant past medical, surgical, family and social history reviewed and updated as indicated. Interim medical history since our last visit reviewed. Allergies and medications reviewed and updated. Outpatient Medications Prior to Visit  Medication Sig Dispense Refill   ALPRAZolam (XANAX) 0.25 MG tablet Take 1 tablet (0.25 mg total) by mouth 2 (two) times daily as needed for anxiety or sleep. 30 tablet 0   amLODipine (NORVASC) 5 MG tablet Take 1 tablet (5 mg total) by mouth daily. 90 tablet 3   atorvastatin (LIPITOR) 10 MG tablet Take 1 tablet (10 mg total) by mouth daily. 90 tablet  3   Calcium Carb-Cholecalciferol (CALCIUM-VITAMIN D) 600-400 MG-UNIT TABS Take 1 tablet by mouth daily.     famotidine (PEPCID) 20 MG tablet Take 1 tablet (20 mg total) by mouth at bedtime.     losartan (COZAAR) 100 MG tablet Take 1 tablet (100 mg total) by mouth daily. 90 tablet 3   potassium chloride SA (KLOR-CON M20) 20 MEQ tablet Take 1 tablet (20 mEq total) by mouth daily. 90 tablet 3   propranolol ER (INDERAL LA) 60 MG 24 hr capsule Take 1 capsule (60 mg total) by mouth daily. 90 capsule 3   No facility-administered medications prior to visit.     Per HPI unless specifically indicated in ROS section below Review of Systems  Objective:  BP 126/66 (BP Location: Right Arm, Cuff Size: Normal)   Pulse 71   Temp (!) 97.4 F (36.3 C) (Temporal)   Ht 5' (1.524 m)   Wt 122 lb 4 oz (55.5 kg)   SpO2 98%   BMI 23.88 kg/m   Wt Readings from Last 3 Encounters:  04/30/21 122 lb 4 oz (55.5 kg)  03/23/21 121 lb (54.9 kg)  02/16/21 118 lb 1 oz (53.6 kg)      Physical Exam Vitals and nursing note reviewed.  Constitutional:      Appearance: Normal appearance. She is not ill-appearing.  Cardiovascular:     Rate and Rhythm: Normal rate and regular rhythm.     Pulses: Normal  pulses.     Heart sounds: Normal heart sounds. No murmur heard. Pulmonary:     Effort: Pulmonary effort is normal. No respiratory distress.     Breath sounds: Normal breath sounds. No wheezing, rhonchi or rales.  Musculoskeletal:     Right lower leg: No edema.     Left lower leg: No edema.  Skin:    Findings: No rash.  Neurological:     Mental Status: She is alert.  Psychiatric:        Mood and Affect: Mood normal.        Behavior: Behavior normal.       Assessment & Plan:  This visit occurred during the SARS-CoV-2 public health emergency.  Safety protocols were in place, including screening questions prior to the visit, additional usage of staff PPE, and extensive cleaning of exam room while observing  appropriate contact time as indicated for disinfecting solutions.   Problem List Items Addressed This Visit     Essential hypertension - Primary    Chronic, great control on current regimen - continue.      Chronic hypokalemia    Stable period on Kdur42mq daily.       Anxiety disorder due to general medical condition with panic attack    Notes less xanax use since feeling better and sleeping better with recent med changes.       Cervical pain (neck)    Upcoming neurosurgery appt, recent neck MRI.  Off lyrica.       PHN (postherpetic neuralgia)    Remote.  Has been off lyrica for the past 3 months, tolerating well.         Meds ordered this encounter  Medications   Zinc 30 MG CAPS    Sig: Take 1 capsule (30 mg total) by mouth daily.    Dispense:  30 capsule   cetirizine (ZYRTEC) 10 MG tablet    Sig: Take 1 tablet (10 mg total) by mouth daily as needed (seasonal allergies.).   No orders of the defined types were placed in this encounter.   Patient Instructions  I think blood pressures are doing great!  Continue current medicines.  Return as needed or in January 2023 for blood pressure check.   Follow up plan: Return in about 5 months (around 09/30/2021) for follow up visit.  JRia Bush MD

## 2021-04-30 NOTE — Assessment & Plan Note (Signed)
Stable period on Kdur11mq daily.

## 2021-04-30 NOTE — Assessment & Plan Note (Signed)
Upcoming neurosurgery appt, recent neck MRI.  Off lyrica.

## 2021-04-30 NOTE — Patient Instructions (Addendum)
I think blood pressures are doing great!  Continue current medicines.  Return as needed or in January 2023 for blood pressure check.

## 2021-04-30 NOTE — Assessment & Plan Note (Signed)
Notes less xanax use since feeling better and sleeping better with recent med changes.

## 2021-05-03 DIAGNOSIS — M4802 Spinal stenosis, cervical region: Secondary | ICD-10-CM | POA: Diagnosis not present

## 2021-05-07 ENCOUNTER — Encounter: Payer: Self-pay | Admitting: Family Medicine

## 2021-07-18 DIAGNOSIS — Z23 Encounter for immunization: Secondary | ICD-10-CM | POA: Diagnosis not present

## 2021-07-31 ENCOUNTER — Other Ambulatory Visit: Payer: Self-pay | Admitting: Family Medicine

## 2021-07-31 DIAGNOSIS — F064 Anxiety disorder due to known physiological condition: Secondary | ICD-10-CM

## 2021-08-01 MED ORDER — ALPRAZOLAM 0.25 MG PO TABS: 0.2500 mg | ORAL_TABLET | Freq: Two times a day (BID) | ORAL | 0 refills | Status: DC | PRN

## 2021-08-01 NOTE — Telephone Encounter (Signed)
Name of Medication: Alprazolam Name of Pharmacy: [facility] or Written Date and Quantity: 01/2821, #30/ 0 refills Last Office Visit and Type: f/u on 04/30/21 Next Office Visit and Type: f/u on 10/01/21 Last Controlled Substance Agreement Date: none Last UDS: none

## 2021-08-01 NOTE — Telephone Encounter (Signed)
ERx 

## 2021-08-23 ENCOUNTER — Telehealth: Payer: Self-pay | Admitting: Family Medicine

## 2021-08-23 NOTE — Chronic Care Management (AMB) (Signed)
°  Chronic Care Management   Note  08/23/2021 Name: Robin Peck MRN: [id number] DOB: [birth year]  Robin Peck is a 75 y.o. year old female who is a primary care patient of [name], MD. I reached out to Dorothey Baseman by phone today in response to a referral sent by Robin Peck PCP, [name], MD.   Robin Peck was given information about Chronic Care Management services today including:  CCM service includes personalized support from designated clinical staff supervised by her physician, including individualized plan of care and coordination with other care providers 24/7 contact phone numbers for assistance for urgent and routine care needs. Service will only be billed when office clinical staff spend 20 minutes or more in a month to coordinate care. Only one practitioner may furnish and bill the service in a calendar month. The patient may stop CCM services at any time (effective at the end of the month) by phone call to the office staff.   Patient agreed to services and verbal consent obtained.   Follow up plan:   Tatjana Secretary/administrator

## 2021-09-02 HISTORY — PX: CATARACT EXTRACTION, BILATERAL: SHX1313

## 2021-10-01 ENCOUNTER — Ambulatory Visit: Payer: Medicare Other | Admitting: Family Medicine

## 2021-10-08 ENCOUNTER — Encounter: Payer: Self-pay | Admitting: Family Medicine

## 2021-10-08 ENCOUNTER — Ambulatory Visit (INDEPENDENT_AMBULATORY_CARE_PROVIDER_SITE_OTHER): Payer: Medicare Other | Admitting: Family Medicine

## 2021-10-08 ENCOUNTER — Telehealth: Payer: Self-pay

## 2021-10-08 ENCOUNTER — Other Ambulatory Visit: Payer: Self-pay

## 2021-10-08 VITALS — BP 148/72 | HR 65 | Temp 97.3°F | Ht 60.0 in | Wt 125.4 lb

## 2021-10-08 DIAGNOSIS — E876 Hypokalemia: Secondary | ICD-10-CM

## 2021-10-08 DIAGNOSIS — I10 Essential (primary) hypertension: Secondary | ICD-10-CM

## 2021-10-08 DIAGNOSIS — R519 Headache, unspecified: Secondary | ICD-10-CM | POA: Diagnosis not present

## 2021-10-08 DIAGNOSIS — M25511 Pain in right shoulder: Secondary | ICD-10-CM

## 2021-10-08 MED ORDER — NORVASC 5 MG PO TABS: 5.0000 mg | ORAL_TABLET | Freq: Every day | ORAL | 1 refills | Status: DC

## 2021-10-08 NOTE — Assessment & Plan Note (Signed)
Continue Kdur 20mEq daily.  

## 2021-10-08 NOTE — Assessment & Plan Note (Signed)
She is off pregabalin since 01/2021 and had done well without this sensation however recently it has recurred, attributes to generic amlodipine - will trial brand Norvasc. Update with effect.

## 2021-10-08 NOTE — Chronic Care Management (AMB) (Signed)
° ° °  Chronic Care Management Pharmacy Assistant   Name: Robin Peck  MRN: [id number] DOB: April 09, 1946  Robin Peck is an 76 y.o. year old female who presents for his initial CCM visit with the clinical pharmacist.  Reason for Encounter: Initial Questions   Conditions to be addressed/monitored: HTN and HLD   Recent office visits:  04/30/21-PCP-Javier Gutierrez,MD-Patient presented for follow up hypertension-I think blood pressures are doing great! no medication changes  Recent consult visits:  10/04/21-Dentistry- patient presented for insertion of occlusal guard. 07/02/21- [facility]-patient presented for preparation of crown on tooth#9. 05/03/21-[facility]-Henry Pool,MD- no data found 04/10/21-[facility]-Henry Pool,MD-no data found   Hospital visits:  None in previous 6 months  Medications: Outpatient Encounter Medications as of 10/08/2021  Medication Sig   ALPRAZolam (XANAX) 0.25 MG tablet Take 1 tablet (0.25 mg total) by mouth 2 (two) times daily as needed for anxiety or sleep.   amLODipine (NORVASC) 5 MG tablet Take 1 tablet (5 mg total) by mouth daily.   atorvastatin (LIPITOR) 10 MG tablet Take 1 tablet (10 mg total) by mouth daily.   Calcium Carb-Cholecalciferol (CALCIUM-VITAMIN D) 600-400 MG-UNIT TABS Take 1 tablet by mouth daily.   cetirizine (ZYRTEC) 10 MG tablet Take 1 tablet (10 mg total) by mouth daily as needed (seasonal allergies.).   famotidine (PEPCID) 20 MG tablet Take 1 tablet (20 mg total) by mouth at bedtime.   losartan (COZAAR) 100 MG tablet Take 1 tablet (100 mg total) by mouth daily.   potassium chloride SA (KLOR-CON M20) 20 MEQ tablet Take 1 tablet (20 mEq total) by mouth daily.   propranolol ER (INDERAL LA) 60 MG 24 hr capsule Take 1 capsule (60 mg total) by mouth daily.   Zinc 30 MG CAPS Take 1 capsule (30 mg total) by mouth daily.   [DISCONTINUED] hydrochlorothiazide (HYDRODIURIL) 25 MG tablet TAKE ONE TABLET BY MOUTH DAILY    [DISCONTINUED] potassium chloride 20 MEQ TBCR Take 10 mEq by mouth 2 (two) times daily.   [DISCONTINUED] pregabalin (LYRICA) 50 MG capsule Take 1 capsule (50 mg total) by mouth daily.   No facility-administered encounter medications on file as of 10/08/2021.    Lab Results  Component Value Date/Time   HGBA1C 6.1 12/15/2019 08:23 AM     BP Readings from Last 3 Encounters:  04/30/21 126/66  03/23/21 (!) 160/92  03/15/21 (!) 167/85    Patient contacted to review initial questions prior to visit with Charlene Brooke.  Is there anything that you would like to discuss during the appointment?  Contacted the patient.Husband answered and spoke for the patient. Counseled patient on purpose of visit with CPP. The husband stated they did not get their questions answered in regards to the CCM services, he cancelled the appointment.     Star Rating Drugs:  Medication:  Last Fill: Day Supply Atorvastatin 10mg  09/12/21 90 Losartan 100mg  08/08/21 90   Care Gaps: Annual wellness visit in last year? Yes Most Recent BP reading: 126/66 71-P 04/30/21      Marjo Bicker CPP notified  Avel Sensor, [place] Assistant [phone]  Total time spent for month CPA: 40 min.

## 2021-10-08 NOTE — Assessment & Plan Note (Signed)
Again consistent with RTC tendonitis with possible GH joint arthritis. Provided with RTC injury exercises. She has resistance band at home.

## 2021-10-08 NOTE — Assessment & Plan Note (Signed)
Chronic, deteriorated. She feels generic amlodipine has not been as effective as prior brand Norvasc and notes return of pounding sensation to head since switch - will request brand Norvasc. Continue monitoring BP at home, notify me if persistently >140/90.

## 2021-10-08 NOTE — Progress Notes (Signed)
Patient ID: Robin Peck, female    DOB: 1946-05-07, 76 y.o.   MRN: [id number]  This visit was conducted in person.  BP (!) 148/72 (BP Location: Left Arm, Cuff Size: Normal)    Pulse 65    Temp (!) 97.3 F (36.3 C) (Temporal)    Ht 5' (1.524 m)    Wt 125 lb 6 oz (56.9 kg)    SpO2 98%    BMI 24.49 kg/m   On recheck 160/66  CC: 6 mo f/u visit  Subjective:   HPI: Robin Peck is a 76 y.o. female presenting on 10/08/2021 for Hypertension (Here for 5 mo f/u.)   Recent dental work at [facility].   Chronic hypokalemia on 27mEq daily replacement. She is off hctz 25mg  daily. Renin/aldosterone levels normal 2022.   HTN - Compliant with current antihypertensive regimen of amlodipine 5mg  daily, losartan 100mg  daily, propranolol SR 60mg  daily. She feels generic amlodipine hasn't been as effective as Norvasc - also notices pounding pressure sensation to bitemporal region of head at night time. Requests switch to brand Norvasc. Does check blood pressures at home: 140-150/70s. No low blood pressure readings or symptoms of dizziness/syncope. Denies HA, vision changes, CP/tightness, SOB, leg swelling.   Stopped pregabalin 01/2021.   Neck pain is doing better. Saw neurosurgery - didn't recommend surgical intervention. Limiting heavy lifting, continues gentle neck stretching with beneift.   Notes ongoing intermittent R shoulder ache for months, started after jerking motion while walking her dog. Points to anterior R shoulder and below distal clavicle. Worse at night when sleeping on her right side. Treating with aspercream as well as salon pas patches.      Relevant past medical, surgical, family and social history reviewed and updated as indicated. Interim medical history since our last visit reviewed. Allergies and medications reviewed and updated. Outpatient Medications Prior to Visit  Medication Sig Dispense Refill   ALPRAZolam (XANAX) 0.25 MG tablet Take 1 tablet (0.25 mg total)  by mouth 2 (two) times daily as needed for anxiety or sleep. 30 tablet 0   atorvastatin (LIPITOR) 10 MG tablet Take 1 tablet (10 mg total) by mouth daily. 90 tablet 3   Calcium Carb-Cholecalciferol (CALCIUM-VITAMIN D) 600-400 MG-UNIT TABS Take 1 tablet by mouth daily.     cetirizine (ZYRTEC) 10 MG tablet Take 1 tablet (10 mg total) by mouth daily as needed (seasonal allergies.).     famotidine (PEPCID) 20 MG tablet Take 1 tablet (20 mg total) by mouth at bedtime.     losartan (COZAAR) 100 MG tablet Take 1 tablet (100 mg total) by mouth daily. 90 tablet 3   potassium chloride SA (KLOR-CON M20) 20 MEQ tablet Take 1 tablet (20 mEq total) by mouth daily. 90 tablet 3   propranolol ER (INDERAL LA) 60 MG 24 hr capsule Take 1 capsule (60 mg total) by mouth daily. 90 capsule 3   Zinc 30 MG CAPS Take 1 capsule (30 mg total) by mouth daily. 30 capsule    amLODipine (NORVASC) 5 MG tablet Take 1 tablet (5 mg total) by mouth daily. 90 tablet 3   No facility-administered medications prior to visit.     Per HPI unless specifically indicated in ROS section below Review of Systems  Objective:  BP (!) 148/72 (BP Location: Left Arm, Cuff Size: Normal)    Pulse 65    Temp (!) 97.3 F (36.3 C) (Temporal)    Ht 5' (1.524 m)    Wt  125 lb 6 oz (56.9 kg)    SpO2 98%    BMI 24.49 kg/m   Wt Readings from Last 3 Encounters:  10/08/21 125 lb 6 oz (56.9 kg)  04/30/21 122 lb 4 oz (55.5 kg)  03/23/21 121 lb (54.9 kg)      Physical Exam Vitals and nursing note reviewed.  Constitutional:      Appearance: Normal appearance. She is not ill-appearing.  Eyes:     Extraocular Movements: Extraocular movements intact.     Conjunctiva/sclera: Conjunctivae normal.     Pupils: Pupils are equal, round, and reactive to light.  Cardiovascular:     Rate and Rhythm: Normal rate and regular rhythm.     Pulses: Normal pulses.     Heart sounds: Normal heart sounds. No murmur heard. Pulmonary:     Effort: Pulmonary effort is  normal. No respiratory distress.     Breath sounds: Normal breath sounds. No wheezing or rhonchi.  Musculoskeletal:        General: No tenderness. Normal range of motion.     Cervical back: Normal range of motion and neck supple. No rigidity.     Right lower leg: No edema.     Left lower leg: No edema.     Comments:  L shoulder WNL R shoulder exam: No deformity of shoulders on inspection.  No pain with palpation of shoulder landmarks.  FROM in abduction and forward flexion, discomfort on end ROM tesitng.  Mild pain with testing SITS in ext/int rotation. ++ pain with empty can sign. + Speed test. No impingement. Discomfort with rotation of humeral head in [facility] joint on right.   Lymphadenopathy:     Cervical: No cervical adenopathy.  Skin:    General: Skin is warm and dry.     Findings: No rash.  Neurological:     Mental Status: She is alert.  Psychiatric:        Mood and Affect: Mood normal.        Behavior: Behavior normal.      Lab Results  Component Value Date   CREATININE 0.81 03/19/2021   BUN 10 03/19/2021   NA 139 03/19/2021   K 4.3 03/19/2021   CL 104 03/19/2021   CO2 29 03/19/2021     Assessment & Plan:  This visit occurred during the SARS-CoV-2 public health emergency.  Safety protocols were in place, including screening questions prior to the visit, additional usage of staff PPE, and extensive cleaning of exam room while observing appropriate contact time as indicated for disinfecting solutions.   Problem List Items Addressed This Visit     Essential hypertension - Primary    Chronic, deteriorated. She feels generic amlodipine has not been as effective as prior brand Norvasc and notes return of pounding sensation to head since switch - will request brand Norvasc. Continue monitoring BP at home, notify me if persistently >140/90.       Relevant Medications   NORVASC 5 MG tablet   Chronic hypokalemia    Continue Kdur 43mEq daily.       Pounding in head     She is off pregabalin since 01/2021 and had done well without this sensation however recently it has recurred, attributes to generic amlodipine - will trial brand Norvasc. Update with effect.       Relevant Medications   NORVASC 5 MG tablet   Right shoulder pain    Again consistent with RTC tendonitis with possible GH joint arthritis. Provided with RTC  injury exercises. She has resistance band at home.         Meds ordered this encounter  Medications   NORVASC 5 MG tablet    Sig: Take 1 tablet (5 mg total) by mouth daily.    Dispense:  90 tablet    Refill:  1    Amlodipine generic caused pounding head sensation   No orders of the defined types were placed in this encounter.    Patient Instructions  BP was too high today Continue monitoring blood pressures at home, let me know if consistently >140/90 to increase amlodipine (norvasc) dose.  I think you have rotator cuff injury and possible tendonitis on the right. You may also have some arthritis of shoulder. Do exercises provided today. Let us know if not improving with this.  You are doing well today.  Return as needed or in 6 months for physical.  Follow up plan: Return in about 6 months (around 04/07/2022) for medicare wellness visit.  [name], MD

## 2021-10-08 NOTE — Patient Instructions (Signed)
BP was too high today Continue monitoring blood pressures at home, let me know if consistently >140/90 to increase amlodipine (norvasc) dose.  I think you have rotator cuff injury and possible tendonitis on the right. You may also have some arthritis of shoulder. Do exercises provided today. Let us know if not improving with this.  You are doing well today.  Return as needed or in 6 months for physical.

## 2021-10-11 ENCOUNTER — Telehealth: Payer: Medicare Other

## 2021-10-15 ENCOUNTER — Other Ambulatory Visit: Payer: Self-pay | Admitting: Family Medicine

## 2022-01-13 DIAGNOSIS — N390 Urinary tract infection, site not specified: Secondary | ICD-10-CM | POA: Diagnosis not present

## 2022-03-08 ENCOUNTER — Other Ambulatory Visit: Payer: Self-pay | Admitting: Family Medicine

## 2022-03-08 DIAGNOSIS — E7800 Pure hypercholesterolemia, unspecified: Secondary | ICD-10-CM

## 2022-03-15 ENCOUNTER — Telehealth: Payer: Self-pay | Admitting: Family Medicine

## 2022-03-15 NOTE — Telephone Encounter (Signed)
Left message for patient to call back and schedule Medicare Annual Wellness Visit (AWV) either virtually or phone   Last AWV ;03/15/21   I left my direct # [phone]

## 2022-03-22 ENCOUNTER — Ambulatory Visit (INDEPENDENT_AMBULATORY_CARE_PROVIDER_SITE_OTHER): Payer: Medicare Other

## 2022-03-22 VITALS — Ht 60.0 in | Wt 124.4 lb

## 2022-03-22 DIAGNOSIS — Z0000 Encounter for general adult medical examination without abnormal findings: Secondary | ICD-10-CM

## 2022-03-22 NOTE — Patient Instructions (Addendum)
Robin Peck , Thank you for taking time to come for your Medicare Wellness Visit. I appreciate your ongoing commitment to your health goals. Please review the following plan we discussed and let me know if I can assist you in the future.   These are the goals we discussed:  Goals       Increase physical activity      Walk on treadmill 30 minutes/ 3x per week.      No current goals (pt-stated)      Patient Stated      03/15/2021, I will maintain and continue medications as prescribed.         This is a list of the screening recommended for you and due dates:  Health Maintenance  Topic Date Due   COVID-19 Vaccine (1) 04/07/2022*   Pneumonia Vaccine (2 - PPSV23 or PCV20) 03/23/2023*   Flu Shot  04/02/2022   Mammogram  04/19/2022   Tetanus Vaccine  01/22/2026   DEXA scan (bone density measurement)  Completed   Hepatitis C Screening: USPSTF Recommendation to screen - Ages 46-79 yo.  Completed   Zoster (Shingles) Vaccine  Completed   HPV Vaccine  Aged Out   Colon Cancer Screening  Discontinued   Cologuard (Stool DNA test)  Discontinued  *Topic was postponed. The date shown is not the original due date.    Advanced directives: Yes  Conditions/risks identified: None  Next appointment: Follow up in one year for your annual wellness visit     Preventive Care 65 Years and Older, Female Preventive care refers to lifestyle choices and visits with your health care provider that can promote health and wellness. What does preventive care include? A yearly physical exam. This is also called an annual well check. Dental exams once or twice a year. Routine eye exams. Ask your health care provider how often you should have your eyes checked. Personal lifestyle choices, including: Daily care of your teeth and gums. Regular physical activity. Eating a healthy diet. Avoiding tobacco and drug use. Limiting alcohol use. Practicing safe sex. Taking low-dose aspirin every day. Taking  vitamin and mineral supplements as recommended by your health care provider. What happens during an annual well check? The services and screenings done by your health care provider during your annual well check will depend on your age, overall health, lifestyle risk factors, and family history of disease. Counseling  Your health care provider may ask you questions about your: Alcohol use. Tobacco use. Drug use. Emotional well-being. Home and relationship well-being. Sexual activity. Eating habits. History of falls. Memory and ability to understand (cognition). Work and work Statistician. Reproductive health. Screening  You may have the following tests or measurements: Height, weight, and BMI. Blood pressure. Lipid and cholesterol levels. These may be checked every 5 years, or more frequently if you are over 16 years old. Skin check. Lung cancer screening. You may have this screening every year starting at age 60 if you have a 30-pack-year history of smoking and currently smoke or have quit within the past 15 years. Fecal occult blood test (FOBT) of the stool. You may have this test every year starting at age 30. Flexible sigmoidoscopy or colonoscopy. You may have a sigmoidoscopy every 5 years or a colonoscopy every 10 years starting at age 72. Hepatitis C blood test. Hepatitis B blood test. Sexually transmitted disease (STD) testing. Diabetes screening. This is done by checking your blood sugar (glucose) after you have not eaten for a while (fasting). You may  have this done every 1-3 years. Bone density scan. This is done to screen for osteoporosis. You may have this done starting at age 35. Mammogram. This may be done every 1-2 years. Talk to your health care provider about how often you should have regular mammograms. Talk with your health care provider about your test results, treatment options, and if necessary, the need for more tests. Vaccines  Your health care provider may  recommend certain vaccines, such as: Influenza vaccine. This is recommended every year. Tetanus, diphtheria, and acellular pertussis (Tdap, Td) vaccine. You may need a Td booster every 10 years. Zoster vaccine. You may need this after age 32. Pneumococcal 13-valent conjugate (PCV13) vaccine. One dose is recommended after age 4. Pneumococcal polysaccharide (PPSV23) vaccine. One dose is recommended after age 74. Talk to your health care provider about which screenings and vaccines you need and how often you need them. This information is not intended to replace advice given to you by your health care provider. Make sure you discuss any questions you have with your health care provider. Document Released: 09/15/2015 Document Revised: 05/08/2016 Document Reviewed: 06/20/2015 Elsevier Interactive Patient Education  2017 [place] Prevention in the Home Falls can cause injuries. They can happen to people of all ages. There are many things you can do to make your home safe and to help prevent falls. What can I do on the outside of my home? Regularly fix the edges of walkways and driveways and fix any cracks. Remove anything that might make you trip as you walk through a door, such as a raised step or threshold. Trim any bushes or trees on the path to your home. Use bright outdoor lighting. Clear any walking paths of anything that might make someone trip, such as rocks or tools. Regularly check to see if handrails are loose or broken. Make sure that both sides of any steps have handrails. Any raised decks and porches should have guardrails on the edges. Have any leaves, snow, or ice cleared regularly. Use sand or salt on walking paths during winter. Clean up any spills in your garage right away. This includes oil or grease spills. What can I do in the bathroom? Use night lights. Install grab bars by the toilet and in the tub and shower. Do not use towel bars as grab bars. Use non-skid  mats or decals in the tub or shower. If you need to sit down in the shower, use a plastic, non-slip stool. Keep the floor dry. Clean up any water that spills on the floor as soon as it happens. Remove soap buildup in the tub or shower regularly. Attach bath mats securely with double-sided non-slip rug tape. Do not have throw rugs and other things on the floor that can make you trip. What can I do in the bedroom? Use night lights. Make sure that you have a light by your bed that is easy to reach. Do not use any sheets or blankets that are too big for your bed. They should not hang down onto the floor. Have a firm chair that has side arms. You can use this for support while you get dressed. Do not have throw rugs and other things on the floor that can make you trip. What can I do in the kitchen? Clean up any spills right away. Avoid walking on wet floors. Keep items that you use a lot in easy-to-reach places. If you need to reach something above you, use a strong step  stool that has a grab bar. Keep electrical cords out of the way. Do not use floor polish or wax that makes floors slippery. If you must use wax, use non-skid floor wax. Do not have throw rugs and other things on the floor that can make you trip. What can I do with my stairs? Do not leave any items on the stairs. Make sure that there are handrails on both sides of the stairs and use them. Fix handrails that are broken or loose. Make sure that handrails are as long as the stairways. Check any carpeting to make sure that it is firmly attached to the stairs. Fix any carpet that is loose or worn. Avoid having throw rugs at the top or bottom of the stairs. If you do have throw rugs, attach them to the floor with carpet tape. Make sure that you have a light switch at the top of the stairs and the bottom of the stairs. If you do not have them, ask someone to add them for you. What else can I do to help prevent falls? Wear shoes  that: Do not have high heels. Have rubber bottoms. Are comfortable and fit you well. Are closed at the toe. Do not wear sandals. If you use a stepladder: Make sure that it is fully opened. Do not climb a closed stepladder. Make sure that both sides of the stepladder are locked into place. Ask someone to hold it for you, if possible. Clearly mark and make sure that you can see: Any grab bars or handrails. First and last steps. Where the edge of each step is. Use tools that help you move around (mobility aids) if they are needed. These include: Canes. Walkers. Scooters. Crutches. Turn on the lights when you go into a dark area. Replace any light bulbs as soon as they burn out. Set up your furniture so you have a clear path. Avoid moving your furniture around. If any of your floors are uneven, fix them. If there are any pets around you, be aware of where they are. Review your medicines with your doctor. Some medicines can make you feel dizzy. This can increase your chance of falling. Ask your doctor what other things that you can do to help prevent falls. This information is not intended to replace advice given to you by your health care provider. Make sure you discuss any questions you have with your health care provider. Document Released: 06/15/2009 Document Revised: 01/25/2016 Document Reviewed: 09/23/2014 Elsevier Interactive Patient Education  2017 [employer].

## 2022-03-22 NOTE — Progress Notes (Signed)
Subjective:   Robin Peck is a 76 y.o. female who presents for Medicare Annual (Subsequent) preventive examination.  Review of Systems    Virtual Visit via Telephone Note  I connected with  Robin Peck on 03/22/22 at  3:00 PM EDT by telephone and verified that I am speaking with the correct person using two identifiers.  Location: Patient: Home Provider: Office Persons participating in the virtual visit: patient/Nurse Health Advisor   I discussed the limitations, risks, security and privacy concerns of performing an evaluation and management service by telephone and the availability of in person appointments. The patient expressed understanding and agreed to proceed.  Interactive audio and video telecommunications were attempted between this nurse and patient, however failed, due to patient having technical difficulties OR patient did not have access to video capability.  We continued and completed visit with audio only.  Some vital signs may be absent or patient reported.   Criselda Peaches, LPN  Cardiac Risk Factors include: advanced age (>14mn, >>61women);hypertension     Objective:    Today's Vitals   03/22/22 1501  Weight: 124 lb 6.4 oz (56.4 kg)  Height: 5' (1.524 m)   Body mass index is 24.3 kg/m.     03/22/2022    3:09 PM 03/15/2021    8:31 AM 06/12/2020   10:02 AM 09/21/2019   11:49 PM 07/29/2018    9:23 AM 05/01/2016    9:38 AM  Advanced Directives  Does Patient Have a Medical Advance Directive? Yes Yes No;Yes No Yes Yes  Type of AParamedicof [place] will [place] will   [place] will Living will;Healthcare Power of Attorney  Does patient want to make changes to medical advance directive? No - Patient declined     No - Patient declined  Copy of [place] Chart? No - copy requested No - copy requested   No - copy requested   Would patient like  information on creating a medical advance directive?    No - Patient declined      Current Medications (verified) Outpatient Encounter Medications as of 03/22/2022  Medication Sig   ALPRAZolam (XANAX) 0.25 MG tablet Take 1 tablet (0.25 mg total) by mouth 2 (two) times daily as needed for anxiety or sleep.   atorvastatin (LIPITOR) 10 MG tablet TAKE ONE TABLET BY MOUTH DAILY   Calcium Carb-Cholecalciferol (CALCIUM-VITAMIN D) 600-400 MG-UNIT TABS Take 1 tablet by mouth daily.   cetirizine (ZYRTEC) 10 MG tablet Take 1 tablet (10 mg total) by mouth daily as needed (seasonal allergies.).   famotidine (PEPCID) 20 MG tablet Take 1 tablet (20 mg total) by mouth at bedtime.   losartan (COZAAR) 100 MG tablet Take 1 tablet (100 mg total) by mouth daily.   losartan (COZAAR) 50 MG tablet TAKE ONE TABLET BY MOUTH TWICE A DAY   NORVASC 5 MG tablet Take 1 tablet (5 mg total) by mouth daily.   potassium chloride SA (KLOR-CON M20) 20 MEQ tablet Take 1 tablet (20 mEq total) by mouth daily.   propranolol ER (INDERAL LA) 60 MG 24 hr capsule TAKE ONE CAPSULE BY MOUTH DAILY   Zinc 30 MG CAPS Take 1 capsule (30 mg total) by mouth daily.   [DISCONTINUED] hydrochlorothiazide (HYDRODIURIL) 25 MG tablet TAKE ONE TABLET BY MOUTH DAILY   [DISCONTINUED] potassium chloride 20 MEQ TBCR Take 10 mEq by mouth 2 (two) times daily.   [DISCONTINUED] pregabalin (LYRICA)  50 MG capsule Take 1 capsule (50 mg total) by mouth daily.   No facility-administered encounter medications on file as of 03/22/2022.    Allergies (verified) Valacyclovir hcl, Gabapentin, Hydrochlorothiazide, and Lisinopril   History: Past Medical History:  Diagnosis Date   Allergic rhinitis    Allergy    seasonal   Anxiety    Arthritis    Heart murmur    Hyperlipidemia    Hypertension    Past Surgical History:  Procedure Laterality Date   ABDOMINAL HYSTERECTOMY     COLONOSCOPY WITH PROPOFOL N/A 06/12/2020   3.5cm lesion removed piecemeal, smaller  polyp removed, int hem ([place])   ENDOSCOPIC MUCOSAL RESECTION N/A 06/12/2020   Procedure: ENDOSCOPIC MUCOSAL RESECTION;  Surgeon: Irving Copas., MD;  Location: [facility];  Service: Gastroenterology;  Laterality: N/A;   HEMOSTASIS CLIP PLACEMENT  06/12/2020   Procedure: HEMOSTASIS CLIP PLACEMENT;  Surgeon: Irving Copas., MD;  Location: [facility];  Service: Gastroenterology;;   PARTIAL HYSTERECTOMY  2002   hysterectomy   POLYPECTOMY  06/12/2020   Procedure: POLYPECTOMY;  Surgeon: Rush Landmark Telford Nab., MD;  Location: [facility];  Service: Gastroenterology;;   SUBMUCOSAL LIFTING INJECTION  06/12/2020   Procedure: SUBMUCOSAL LIFTING INJECTION;  Surgeon: Irving Copas., MD;  Location: [facility] ENDOSCOPY;  Service: Gastroenterology;;   Family History  Problem Relation Age of Onset   Heart attack Brother    Hypertension Brother    Hypertension Father    Hypertension Mother    Stroke Mother    Hypertension Sister    Heart attack Maternal Grandfather    Colon cancer Neg Hx    Stomach cancer Neg Hx    Esophageal cancer Neg Hx    Pancreatic cancer Neg Hx    Liver cancer Neg Hx    Inflammatory bowel disease Neg Hx    Rectal cancer Neg Hx    Colon polyps Neg Hx    Social History   Socioeconomic History   Marital status: Married    Spouse name: Not on file   Number of children: 1   Years of education: Not on file   Highest education level: Not on file  Occupational History   Not on file  Tobacco Use   Smoking status: Never   Smokeless tobacco: Never  Vaping Use   Vaping Use: Never used  Substance and Sexual Activity   Alcohol use: Never   Drug use: No   Sexual activity: Not on file  Other Topics Concern   Not on file  Social History Narrative   Lives with husband [name]. No children.    Social Determinants of Health   Financial Resource Strain: Low Risk  (03/22/2022)   Overall Financial Resource Strain (CARDIA)    Difficulty of Paying  Living Expenses: Not hard at all  Food Insecurity: No Food Insecurity (03/22/2022)   Hunger Vital Sign    Worried About Running Out of Food in the Last Year: Never true    Ran Out of Food in the Last Year: Never true  Transportation Needs: No Transportation Needs (03/22/2022)   PRAPARE - Hydrologist (Medical): No    Lack of Transportation (Non-Medical): No  Physical Activity: Insufficiently Active (03/22/2022)   Exercise Vital Sign    Days of Exercise per Week: 3 days    Minutes of Exercise per Session: 30 min  Stress: No Stress Concern Present (03/22/2022)   [place]  Feeling of Stress : Not at all  Social Connections: Socially Integrated (03/22/2022)   Social Connection and Isolation Panel [NHANES]    Frequency of Communication with Friends and Family: More than three times a week    Frequency of Social Gatherings with Friends and Family: More than three times a week    Attends Religious Services: More than 4 times per year    Active Member of [employer] or Organizations: Yes    Attends Music therapist: More than 4 times per year    Marital Status: Married    Tobacco Counseling Counseling given: Not Answered   Clinical Intake:  Pre-visit preparation completed: No  Diabetic? No  Interpreter Needed?: No Activities of Daily Living    03/22/2022    3:08 PM  In your present state of health, do you have any difficulty performing the following activities:  Hearing? 1  Comment Wears hearing aids  Vision? 0  Difficulty concentrating or making decisions? 0  Walking or climbing stairs? 0  Dressing or bathing? 0  Doing errands, shopping? 0  Preparing Food and eating ? N  Using the Toilet? N  In the past six months, have you accidently leaked urine? N  Do you have problems with loss of bowel control? N  Managing your Medications? N  Managing your Finances? N  Housekeeping  or managing your Housekeeping? N    Patient Care Team: [name], MD as PCP - General (Family Medicine) Charlton Haws, [facility] as Pharmacist (Pharmacist)  Indicate any recent Medical Services you may have received from other than [facility] providers in the past year (date may be approximate).     Assessment:   This is a routine wellness examination for Robin Peck.  Hearing/Vision screen Hearing Screening - Comments:: Wears hearing aids Vision Screening - Comments:: Wears reading glasses. Followed by [employer]  Dietary issues and exercise activities discussed: Exercise limited by: None identified   Goals Addressed               This Visit's Progress     No current goals (pt-stated)         Depression Screen    03/22/2022    3:05 PM 03/15/2021    8:37 AM 12/20/2019    9:50 AM 10/13/2019   12:20 PM 07/29/2018    9:25 AM 06/23/2017   10:12 AM 02/05/2017    8:02 AM  PHQ 2/9 Scores  PHQ - 2 Score 0 0 0 0 0 0 0  PHQ- 9 Score  0 0 4       Fall Risk    03/22/2022    3:09 PM 03/15/2021    8:35 AM 12/20/2019    9:50 AM 07/29/2018    9:25 AM 06/23/2017   10:12 AM  [place] in the past year? 0 1 0 0 No  Number falls in past yr: 0 0     Injury with Fall? 0 0     Risk for fall due to : No Fall Risks Medication side effect     Follow up  Falls evaluation completed;Falls prevention discussed       FALL RISK PREVENTION PERTAINING TO THE HOME:  Any stairs in or around the home? Yes  If so, are there any without handrails? No  Home free of loose throw rugs in walkways, pet beds, electrical cords, etc? Yes  Adequate lighting in your home to reduce risk of falls? Yes   ASSISTIVE DEVICES UTILIZED TO PREVENT  FALLS:  Life alert? No  Use of a cane, walker or w/c? No  Grab bars in the bathroom? Yes  Shower chair or bench in shower? Yes  Elevated toilet seat or a handicapped toilet? Yes   TIMED UP AND GO:  Was the test performed? No . Audio Visit  Cognitive  Function:    03/15/2021    8:45 AM  MMSE - Mini Mental State Exam  Orientation to time 5  Orientation to Place 5  Registration 3  Attention/ Calculation 5  Recall 3  Language- repeat 1        03/22/2022    3:10 PM  6CIT Screen  What Year? 0 points  What month? 0 points  What time? 0 points  Count back from 20 0 points  Months in reverse 0 points  Repeat phrase 2 points  Total Score 2 points    Immunizations Immunization History  Administered Date(s) Administered   Influenza, High Dose Seasonal PF 08/30/2020, 07/18/2021   Pneumococcal Conjugate-13 02/14/2016   Td 01/23/2016   Zoster Recombinat (Shingrix) 10/13/2019, 01/15/2020    TDAP status: Up to date  Flu Vaccine status: Up to date  Pneumococcal vaccine status: Due, Education has been provided regarding the importance of this vaccine. Advised may receive this vaccine at local pharmacy or Health Dept. Aware to provide a copy of the vaccination record if obtained from local pharmacy or Health Dept. Verbalized acceptance and understanding.  Covid-19 vaccine status: Declined, Education has been provided regarding the importance of this vaccine but patient still declined. Advised may receive this vaccine at local pharmacy or Health Dept.or vaccine clinic. Aware to provide a copy of the vaccination record if obtained from local pharmacy or Health Dept. Verbalized acceptance and understanding.  Qualifies for Shingles Vaccine? Yes   Zostavax completed Yes   Shingrix Completed?: Yes  Screening Tests Health Maintenance  Topic Date Due   COVID-19 Vaccine (1) 04/07/2022 (Originally 08/04/1946)   Pneumonia Vaccine 23+ Years old (2 - PPSV23 or PCV20) 03/23/2023 (Originally 02/13/2017)   INFLUENZA VACCINE  04/02/2022   MAMMOGRAM  04/19/2022   TETANUS/TDAP  01/22/2026   DEXA SCAN  Completed   Hepatitis C Screening  Completed   Zoster Vaccines- Shingrix  Completed   HPV VACCINES  Aged Out   COLONOSCOPY (Pts 45-35yr Insurance  coverage will need to be confirmed)  Discontinued   Fecal DNA (Cologuard)  Discontinued    Health Maintenance  There are no preventive care reminders to display for this patient.   Colorectal cancer screening: Type of screening: Colonoscopy. Completed 06/12/20. Repeat every   years  Mammogram status: Completed 04/19/21. Repeat every year  Bone Density status: Completed 04/04/20. Results reflect: Bone density results: OSTEOPOROSIS. Repeat every   years.  Lung Cancer Screening: (Low Dose CT Chest recommended if Age 76-80years, 30 pack-year currently smoking OR have quit w/in 15years.)  qualify.     Additional Screening:  Hepatitis C Screening: does qualify; Completed 07/09/15  Vision Screening: Recommended annual ophthalmology exams for early detection of glaucoma and other disorders of the eye. Is the patient up to date with their annual eye exam?  Yes  Who is the provider or what is the name of the office in which the patient attends annual eye exams? [facility] If pt is not established with a provider, would they like to be referred to a provider to establish care? No .   Dental Screening: Recommended annual dental exams for proper oral hygiene  [employer]  Referral / Chronic Care Management:   CRR required this visit?  No   CCM required this visit?  No      Plan:     I have personally reviewed and noted the following in the patient's chart:   Medical and social history Use of alcohol, tobacco or illicit drugs  Current medications and supplements including opioid prescriptions.  Functional ability and status Nutritional status Physical activity Advanced directives List of other physicians Hospitalizations, surgeries, and ER visits in previous 12 months Vitals Screenings to include cognitive, depression, and falls Referrals and appointments  In addition, I have reviewed and discussed with patient certain preventive protocols, quality metrics, and best practice  recommendations. A written personalized care plan for preventive services as well as general preventive health recommendations were provided to patient.     Criselda Peaches, LPN   [id number]   Nurse Notes: None

## 2022-04-01 ENCOUNTER — Other Ambulatory Visit: Payer: Self-pay | Admitting: Family Medicine

## 2022-04-01 DIAGNOSIS — R7989 Other specified abnormal findings of blood chemistry: Secondary | ICD-10-CM

## 2022-04-01 DIAGNOSIS — E7800 Pure hypercholesterolemia, unspecified: Secondary | ICD-10-CM

## 2022-04-01 DIAGNOSIS — E042 Nontoxic multinodular goiter: Secondary | ICD-10-CM

## 2022-04-01 DIAGNOSIS — M810 Age-related osteoporosis without current pathological fracture: Secondary | ICD-10-CM

## 2022-04-03 ENCOUNTER — Other Ambulatory Visit: Payer: Medicare Other

## 2022-04-04 ENCOUNTER — Other Ambulatory Visit (INDEPENDENT_AMBULATORY_CARE_PROVIDER_SITE_OTHER): Payer: Medicare Other

## 2022-04-04 DIAGNOSIS — M810 Age-related osteoporosis without current pathological fracture: Secondary | ICD-10-CM

## 2022-04-04 DIAGNOSIS — R7989 Other specified abnormal findings of blood chemistry: Secondary | ICD-10-CM | POA: Diagnosis not present

## 2022-04-04 DIAGNOSIS — E042 Nontoxic multinodular goiter: Secondary | ICD-10-CM

## 2022-04-04 DIAGNOSIS — E7800 Pure hypercholesterolemia, unspecified: Secondary | ICD-10-CM | POA: Diagnosis not present

## 2022-04-04 LAB — COMPREHENSIVE METABOLIC PANEL
ALT: 12 U/L (ref 0–35)
AST: 17 U/L (ref 0–37)
Albumin: 4.3 g/dL (ref 3.5–5.2)
Alkaline Phosphatase: 87 U/L (ref 39–117)
BUN: 14 mg/dL (ref 6–23)
CO2: 31 mEq/L (ref 19–32)
Calcium: 10 mg/dL (ref 8.4–10.5)
Chloride: 102 mEq/L (ref 96–112)
Creatinine, Ser: 0.76 mg/dL (ref 0.40–1.20)
GFR: 76.25 mL/min (ref >60.00)
Glucose, Bld: 95 mg/dL (ref 70–99)
Potassium: 4.6 mEq/L (ref 3.5–5.1)
Sodium: 139 mEq/L (ref 135–145)
Total Bilirubin: 0.5 mg/dL (ref 0.2–1.2)
Total Protein: 7.7 g/dL (ref 6.0–8.3)

## 2022-04-04 LAB — T4, FREE: Free T4: 1.18 ng/dL (ref 0.60–1.60)

## 2022-04-04 LAB — LIPID PANEL
Cholesterol: 175 mg/dL (ref 0–200)
HDL: 37.4 mg/dL — ABNORMAL LOW (ref >39.00)
NonHDL: 137.56
Total CHOL/HDL Ratio: 5
Triglycerides: 224 mg/dL — ABNORMAL HIGH (ref 0.0–149.0)
VLDL: 44.8 mg/dL — ABNORMAL HIGH (ref 0.0–40.0)

## 2022-04-04 LAB — VITAMIN D 25 HYDROXY (VIT D DEFICIENCY, FRACTURES): VITD: 19.91 ng/mL — ABNORMAL LOW (ref 30.00–100.00)

## 2022-04-04 LAB — LDL CHOLESTEROL, DIRECT: Direct LDL: 99 mg/dL

## 2022-04-04 LAB — TSH: TSH: 2.58 u[IU]/mL (ref 0.35–5.50)

## 2022-04-09 ENCOUNTER — Encounter: Payer: Self-pay | Admitting: Family Medicine

## 2022-04-09 ENCOUNTER — Ambulatory Visit (INDEPENDENT_AMBULATORY_CARE_PROVIDER_SITE_OTHER): Payer: Medicare Other | Admitting: Family Medicine

## 2022-04-09 VITALS — BP 120/76 | HR 63 | Temp 97.7°F | Ht 60.25 in | Wt 129.1 lb

## 2022-04-09 DIAGNOSIS — Z7189 Other specified counseling: Secondary | ICD-10-CM | POA: Diagnosis not present

## 2022-04-09 DIAGNOSIS — I10 Essential (primary) hypertension: Secondary | ICD-10-CM | POA: Diagnosis not present

## 2022-04-09 DIAGNOSIS — F410 Panic disorder [episodic paroxysmal anxiety] without agoraphobia: Secondary | ICD-10-CM | POA: Diagnosis not present

## 2022-04-09 DIAGNOSIS — Z23 Encounter for immunization: Secondary | ICD-10-CM | POA: Diagnosis not present

## 2022-04-09 DIAGNOSIS — K635 Polyp of colon: Secondary | ICD-10-CM

## 2022-04-09 DIAGNOSIS — E876 Hypokalemia: Secondary | ICD-10-CM

## 2022-04-09 DIAGNOSIS — M810 Age-related osteoporosis without current pathological fracture: Secondary | ICD-10-CM | POA: Diagnosis not present

## 2022-04-09 DIAGNOSIS — F064 Anxiety disorder due to known physiological condition: Secondary | ICD-10-CM

## 2022-04-09 DIAGNOSIS — E871 Hypo-osmolality and hyponatremia: Secondary | ICD-10-CM | POA: Diagnosis not present

## 2022-04-09 DIAGNOSIS — R7989 Other specified abnormal findings of blood chemistry: Secondary | ICD-10-CM

## 2022-04-09 DIAGNOSIS — E7800 Pure hypercholesterolemia, unspecified: Secondary | ICD-10-CM

## 2022-04-09 MED ORDER — CENTRUM SILVER 50+WOMEN PO TABS: 1.0000 | ORAL_TABLET | Freq: Every day | ORAL | Status: DC

## 2022-04-09 MED ORDER — POTASSIUM CHLORIDE CRYS ER 20 MEQ PO TBCR
20.0000 meq | EXTENDED_RELEASE_TABLET | Freq: Every day | ORAL | 3 refills | Status: DC
Start: 2022-04-09 — End: 2023-04-04

## 2022-04-09 MED ORDER — NORVASC 5 MG PO TABS: 5.0000 mg | ORAL_TABLET | Freq: Every day | ORAL | 3 refills | Status: DC

## 2022-04-09 MED ORDER — ATORVASTATIN CALCIUM 10 MG PO TABS: 10.0000 mg | ORAL_TABLET | Freq: Every day | ORAL | 3 refills | Status: DC

## 2022-04-09 MED ORDER — PROPRANOLOL HCL ER 60 MG PO CP24: 60.0000 mg | ORAL_CAPSULE | Freq: Every day | ORAL | 3 refills | Status: DC

## 2022-04-09 MED ORDER — LOSARTAN POTASSIUM 100 MG PO TABS
100.0000 mg | ORAL_TABLET | Freq: Every day | ORAL | 3 refills | Status: DC
Start: 2022-04-09 — End: 2023-04-14

## 2022-04-09 MED ORDER — ALPRAZOLAM 0.25 MG PO TABS: 0.2500 mg | ORAL_TABLET | Freq: Two times a day (BID) | ORAL | 0 refills | Status: DC | PRN

## 2022-04-09 NOTE — Assessment & Plan Note (Addendum)
Advanced directive discussion - has this at home and husband is HCPOA, however in process of revising. Packet previously provided. Advised to bring u a copy - she states husband doesn't want to provide. Full code , but doesn't think would want intubation.

## 2022-04-09 NOTE — Assessment & Plan Note (Signed)
Levels remain normal

## 2022-04-09 NOTE — Assessment & Plan Note (Signed)
Continue Kdur 4mq daily.

## 2022-04-09 NOTE — Assessment & Plan Note (Signed)
Chronic, stable on current regimen - bp improved on recheck.

## 2022-04-09 NOTE — Assessment & Plan Note (Signed)
Restart vitamin D weekly.  Consider repeat DEXA next year.

## 2022-04-09 NOTE — Assessment & Plan Note (Signed)
Due for repeat - new referral placed, # provided to schedule appt.

## 2022-04-09 NOTE — Assessment & Plan Note (Signed)
Levels normalized off thiazide diuretic.

## 2022-04-09 NOTE — Patient Instructions (Addendum)
Pneumovax today.  We will refer you back to GI to discuss repeat colonoscopy.  You may call [facility] to schedule an appointment at [phone].   Call to schedule mammogram at your convenience: [facility] [phone] Start vitamin D 50,000 units weekly - sent to pharmacy. Recheck levels in 6 months.  Good to see you today Return as needed or in 6 months for follow up visit.   Health Maintenance After Age 8 After age 85, you are at a higher risk for certain long-term diseases and infections as well as injuries from falls. Falls are a major cause of broken bones and head injuries in people who are older than age 66. Getting regular preventive care can help to keep you healthy and well. Preventive care includes getting regular testing and making lifestyle changes as recommended by your health care provider. Talk with your health care provider about: Which screenings and tests you should have. A screening is a test that checks for a disease when you have no symptoms. A diet and exercise plan that is right for you. What should I know about screenings and tests to prevent falls? Screening and testing are the best ways to find a health problem early. Early diagnosis and treatment give you the best chance of managing medical conditions that are common after age 76. Certain conditions and lifestyle choices may make you more likely to have a fall. Your health care provider may recommend: Regular vision checks. Poor vision and conditions such as cataracts can make you more likely to have a fall. If you wear glasses, make sure to get your prescription updated if your vision changes. Medicine review. Work with your health care provider to regularly review all of the medicines you are taking, including over-the-counter medicines. Ask your health care provider about any side effects that may make you more likely to have a fall. Tell your health care provider if any medicines that you  take make you feel dizzy or sleepy. Strength and balance checks. Your health care provider may recommend certain tests to check your strength and balance while standing, walking, or changing positions. Foot health exam. Foot pain and numbness, as well as not wearing proper footwear, can make you more likely to have a fall. Screenings, including: Osteoporosis screening. Osteoporosis is a condition that causes the bones to get weaker and break more easily. Blood pressure screening. Blood pressure changes and medicines to control blood pressure can make you feel dizzy. Depression screening. You may be more likely to have a fall if you have a fear of falling, feel depressed, or feel unable to do activities that you used to do. Alcohol use screening. Using too much alcohol can affect your balance and may make you more likely to have a fall. Follow these instructions at home: Lifestyle Do not drink alcohol if: Your health care provider tells you not to drink. If you drink alcohol: Limit how much you have to: 0-1 drink a day for women. 0-2 drinks a day for men. Know how much alcohol is in your drink. In the [place] one drink equals one 12 oz bottle of beer (355 mL), one 5 oz glass of wine (148 mL), or one 1 oz glass of hard liquor (44 mL). Do not use any products that contain nicotine or tobacco. These products include cigarettes, chewing tobacco, and vaping devices, such as e-cigarettes. If you need help quitting, ask your health care provider. Activity  Follow a regular exercise  program to stay fit. This will help you maintain your balance. Ask your health care provider what types of exercise are appropriate for you. If you need a cane or walker, use it as recommended by your health care provider. Wear supportive shoes that have nonskid soles. Safety  Remove any tripping hazards, such as rugs, cords, and clutter. Install safety equipment such as grab bars in bathrooms and safety rails on  stairs. Keep rooms and walkways well-lit. General instructions Talk with your health care provider about your risks for falling. Tell your health care provider if: You fall. Be sure to tell your health care provider about all falls, even ones that seem minor. You feel dizzy, tiredness (fatigue), or off-balance. Take over-the-counter and prescription medicines only as told by your health care provider. These include supplements. Eat a healthy diet and maintain a healthy weight. A healthy diet includes low-fat dairy products, low-fat (lean) meats, and fiber from whole grains, beans, and lots of fruits and vegetables. Stay current with your vaccines. Schedule regular health, dental, and eye exams. Summary Having a healthy lifestyle and getting preventive care can help to protect your health and wellness after age 79. Screening and testing are the best way to find a health problem early and help you avoid having a fall. Early diagnosis and treatment give you the best chance for managing medical conditions that are more common for people who are older than age 29. Falls are a major cause of broken bones and head injuries in people who are older than age 37. Take precautions to prevent a fall at home. Work with your health care provider to learn what changes you can make to improve your health and wellness and to prevent falls. This information is not intended to replace advice given to you by your health care provider. Make sure you discuss any questions you have with your health care provider. Document Revised: 01/08/2021 Document Reviewed: 01/08/2021 Elsevier Patient Education  [place].

## 2022-04-09 NOTE — Assessment & Plan Note (Addendum)
Continue PRN xanax.

## 2022-04-09 NOTE — Progress Notes (Signed)
Patient ID: Robin Peck, female    DOB: 12-Oct-1945, 76 y.o.   MRN: [id number]  This visit was conducted in person.  BP 120/76 (BP Location: Right Arm, Cuff Size: Normal)   Pulse 63   Temp 97.7 F (36.5 C) (Temporal)   Ht 5' 0.25" (1.53 m)   Wt 129 lb 2 oz (58.6 kg)   SpO2 97%   BMI 25.01 kg/m   On recheck 120/76  CC: AMW f/u visit  Subjective:   HPI: Robin Peck is a 76 y.o. female presenting on 04/09/2022 for Annual Exam (MCR prt 2. )   Saw health advisor last month for medicare wellness visit. Note reviewed.     03/22/2022    3:10 PM  6CIT Screen  What Year? 0 points  What month? 0 points  What time? 0 points  Count back from 20 0 points  Months in reverse 0 points  Repeat phrase 2 points  Total Score 2 points    No results found.  Flowsheet Row Clinical Support from 03/22/2022 in [facility]  PHQ-2 Total Score 0          03/22/2022    3:09 PM 03/15/2021    8:35 AM 12/20/2019    9:50 AM 07/29/2018    9:25 AM 06/23/2017   10:12 AM  Fall Risk   Falls in the past year? 0 1 0 0 No  Number falls in past yr: 0 0     Injury with Fall? 0 0     Risk for fall due to : No Fall Risks Medication Peck effect     Follow up  Falls evaluation completed;Falls prevention discussed      HTN - continues taking brand Norvasc '5mg'$  QAM daily, losartan '100mg'$  qhs, and potassium 8mq daily. Also on propranolol LA '60mg'$  daily. Home BPs running well controlled 120-140s/50-60s, occasionally elevated - worse when she doesn't sleep well. Didn't sleep well tonight.   Pregabalin stopped 01/2021.   Noting hot flashes predominantly at night time that can affect sleep, no night sweats.   Chronic neck pain - saw neurosurgery who didn't recommend surgical intervention. Limiting heavy lifting, continues gentle neck stretching with beneift.   She is taking centrum silver MVI.  Planning upcoming cataract surgery   Preventative: Cologuard positive last year -  Colonoscopy 03/2020 - 3.5 cm polyp - rec return for removal  Colonoscopy 06/2020 - full piecemeal removal - SSP and TA - rec repeat colonoscopy 6-9 months ([facility]) - DUE Mammogram 04/2021 Bir[facility] 1 @ [facility] Well woman exam - >10 yrs ago - has not seen GYN recently. No pelvic pain, vaginal bleeding  DEXA 04/2020: T score -2.7 spine, -2.6 L hip (osteoporosis) - has made healthy diet choices. Discussed weight bearing exercises. Declines medication to treat this. Will repeat next year.  Lung cancer screening - not eligible Flu shot - 08/2020 COVID vaccine - didn't receive  Prevnar-13 2017 - got sick with this. Pneumovax23 today Td 2017  Shingrix - 10/2019, 01/2020 Advanced directive discussion - has this at home and husband is HCPOA, however in process of revising. Packet previously provided. Advised to bring u a copy - she states husband doesn't want to provide. Full code , but doesn't think would want intubation.  Seat belt use discussed Sunscreen use discussed. No changing moles on skin.  Sleep - averaging 6-7 hours/night  Non smoker Alcohol - none Dentist - yearly  Eye exam - yearly Bowel -  no constipation  Bladder - some stress incontinence, not bothersome   Lives with husband Robin Peck.      Relevant past medical, surgical, family and social history reviewed and updated as indicated. Interim medical history since our last visit reviewed. Allergies and medications reviewed and updated. Outpatient Medications Prior to Visit  Medication Sig Dispense Refill   potassium chloride SA (KLOR-CON M20) 20 MEQ tablet Take 1 tablet (20 mEq total) by mouth daily. 90 tablet 3   Calcium Carb-Cholecalciferol (CALCIUM-VITAMIN D) 600-400 MG-UNIT TABS Take 1 tablet by mouth daily.     cetirizine (ZYRTEC) 10 MG tablet Take 1 tablet (10 mg total) by mouth daily as needed (seasonal allergies.).     famotidine (PEPCID) 20 MG tablet Take 1 tablet (20 mg total) by mouth at bedtime.     Zinc 30 MG  CAPS Take 1 capsule (30 mg total) by mouth daily. 30 capsule    ALPRAZolam (XANAX) 0.25 MG tablet Take 1 tablet (0.25 mg total) by mouth 2 (two) times daily as needed for anxiety or sleep. 30 tablet 0   atorvastatin (LIPITOR) 10 MG tablet TAKE ONE TABLET BY MOUTH DAILY 90 tablet 0   losartan (COZAAR) 100 MG tablet Take 1 tablet (100 mg total) by mouth daily. 90 tablet 3   losartan (COZAAR) 50 MG tablet TAKE ONE TABLET BY MOUTH TWICE A DAY 180 tablet 1   NORVASC 5 MG tablet Take 1 tablet (5 mg total) by mouth daily. 90 tablet 1   propranolol ER (INDERAL LA) 60 MG 24 hr capsule TAKE ONE CAPSULE BY MOUTH DAILY 90 capsule 0   No facility-administered medications prior to visit.     Per HPI unless specifically indicated in ROS section below Review of Systems  Objective:  BP 120/76 (BP Location: Right Arm, Cuff Size: Normal)   Pulse 63   Temp 97.7 F (36.5 C) (Temporal)   Ht 5' 0.25" (1.53 m)   Wt 129 lb 2 oz (58.6 kg)   SpO2 97%   BMI 25.01 kg/m   Wt Readings from Last 3 Encounters:  04/09/22 129 lb 2 oz (58.6 kg)  03/22/22 124 lb 6.4 oz (56.4 kg)  10/08/21 125 lb 6 oz (56.9 kg)      Physical Exam Vitals and nursing note reviewed.  Constitutional:      Appearance: Normal appearance. She is not ill-appearing.  HENT:     Head: Normocephalic and atraumatic.     Right Ear: Tympanic membrane, ear canal and external ear normal. There is no impacted cerumen.     Left Ear: Tympanic membrane, ear canal and external ear normal. There is no impacted cerumen.  Eyes:     General:        Right eye: No discharge.        Left eye: No discharge.     Extraocular Movements: Extraocular movements intact.     Conjunctiva/sclera: Conjunctivae normal.     Pupils: Pupils are equal, round, and reactive to light.     Comments: cataracts  Neck:     Thyroid: No thyroid mass or thyromegaly.     Vascular: No carotid bruit.  Cardiovascular:     Rate and Rhythm: Normal rate and regular rhythm.      Pulses: Normal pulses.     Heart sounds: Normal heart sounds. No murmur heard. Pulmonary:     Effort: Pulmonary effort is normal. No respiratory distress.     Breath sounds: Normal breath sounds. No wheezing, rhonchi or rales.  Abdominal:     General: Bowel sounds are normal. There is no distension.     Palpations: Abdomen is soft. There is no mass.     Tenderness: There is no abdominal tenderness. There is no guarding or rebound.     Hernia: No hernia is present.  Musculoskeletal:     Cervical back: Normal range of motion and neck supple. No rigidity.     Right lower leg: No edema.     Left lower leg: No edema.  Lymphadenopathy:     Cervical: No cervical adenopathy.  Skin:    General: Skin is warm and dry.     Findings: No rash.  Neurological:     General: No focal deficit present.     Mental Status: She is alert. Mental status is at baseline.  Psychiatric:        Mood and Affect: Mood normal.        Behavior: Behavior normal.       Results for orders placed or performed in visit on 04/04/22  T4, free  Result Value Ref Range   Free T4 1.18 0.60 - 1.60 ng/dL  VITAMIN D 25 Hydroxy (Vit-D Deficiency, Fractures)  Result Value Ref Range   VITD 19.91 (L) 30.00 - 100.00 ng/mL  TSH  Result Value Ref Range   TSH 2.58 0.35 - 5.50 uIU/mL  Comprehensive metabolic panel  Result Value Ref Range   Sodium 139 135 - 145 mEq/L   Potassium 4.6 3.5 - 5.1 mEq/L   Chloride 102 96 - 112 mEq/L   CO2 31 19 - 32 mEq/L   Glucose, Bld 95 70 - 99 mg/dL   BUN 14 6 - 23 mg/dL   Creatinine, Ser 0.76 0.40 - 1.20 mg/dL   Total Bilirubin 0.5 0.2 - 1.2 mg/dL   Alkaline Phosphatase 87 39 - 117 U/L   AST 17 0 - 37 U/L   ALT 12 0 - 35 U/L   Total Protein 7.7 6.0 - 8.3 g/dL   Albumin 4.3 3.5 - 5.2 g/dL   GFR 76.25 >60.00 mL/min   Calcium 10.0 8.4 - 10.5 mg/dL  Lipid panel  Result Value Ref Range   Cholesterol 175 0 - 200 mg/dL   Triglycerides 224.0 (H) 0.0 - 149.0 mg/dL   HDL 37.40 (L) >39.00  mg/dL   VLDL 44.8 (H) 0.0 - 40.0 mg/dL   Total CHOL/HDL Ratio 5    NonHDL 137.56   LDL cholesterol, direct  Result Value Ref Range   Direct LDL 99.0 mg/dL    Assessment & Plan:   Problem List Items Addressed This Visit     Advanced care planning/counseling discussion - Primary (Chronic)    Advanced directive discussion - has this at home and husband is HCPOA, however in process of revising. Packet previously provided. Advised to bring u a copy - she states husband doesn't want to provide. Full code , but doesn't think would want intubation.       HLD (hyperlipidemia)    Chronic, continues atorvastatin '10mg'$  daily. Consider increasing dose given elevated trig levels. The 10-year ASCVD risk score (Arnett DK, et al., 2019) is: 20.2%   Values used to calculate the score:     Age: 63 years     Sex: Female     Is Non-Hispanic African American: No     Diabetic: No     Tobacco smoker: No     Systolic Blood Pressure: 025 mmHg     Is BP treated: Yes  HDL Cholesterol: 37.4 mg/dL     Total Cholesterol: 175 mg/dL       Relevant Medications   atorvastatin (LIPITOR) 10 MG tablet   NORVASC 5 MG tablet   losartan (COZAAR) 100 MG tablet   propranolol ER (INDERAL LA) 60 MG 24 hr capsule   Essential hypertension    Chronic, stable on current regimen - bp improved on recheck.       Relevant Medications   atorvastatin (LIPITOR) 10 MG tablet   NORVASC 5 MG tablet   losartan (COZAAR) 100 MG tablet   propranolol ER (INDERAL LA) 60 MG 24 hr capsule   Chronic hypokalemia    Continue Kdur 44mq daily.       Anxiety disorder due to general medical condition with panic attack    Continue PRN xanax.       Relevant Medications   ALPRAZolam (XANAX) 0.25 MG tablet   Osteoporosis    Restart vitamin D weekly.  Consider repeat DEXA next year.       Cecal polyp    Due for repeat - new referral placed, # provided to schedule appt.       Relevant Orders   Ambulatory referral to  Gastroenterology   Hyponatremia    Levels normalized off thiazide diuretic.       Abnormal TSH    Levels remain normal      Other Visit Diagnoses     Need for 23-polyvalent pneumococcal polysaccharide vaccine       Relevant Orders   Pneumococcal polysaccharide vaccine 23-valent greater than or equal to 2yo subcutaneous/IM (Completed)        Meds ordered this encounter  Medications   ALPRAZolam (XANAX) 0.25 MG tablet    Sig: Take 1 tablet (0.25 mg total) by mouth 2 (two) times daily as needed for anxiety or sleep.    Dispense:  30 tablet    Refill:  0   atorvastatin (LIPITOR) 10 MG tablet    Sig: Take 1 tablet (10 mg total) by mouth daily.    Dispense:  90 tablet    Refill:  3   NORVASC 5 MG tablet    Sig: Take 1 tablet (5 mg total) by mouth daily.    Dispense:  90 tablet    Refill:  3    Amlodipine generic caused pounding head sensation   losartan (COZAAR) 100 MG tablet    Sig: Take 1 tablet (100 mg total) by mouth daily.    Dispense:  90 tablet    Refill:  3   potassium chloride SA (KLOR-CON M20) 20 MEQ tablet    Sig: Take 1 tablet (20 mEq total) by mouth daily.    Dispense:  90 tablet    Refill:  3   propranolol ER (INDERAL LA) 60 MG 24 hr capsule    Sig: Take 1 capsule (60 mg total) by mouth daily.    Dispense:  90 capsule    Refill:  3   Multiple Vitamins-Minerals (CENTRUM SILVER 50+WOMEN) TABS    Sig: Take 1 tablet by mouth daily.   Orders Placed This Encounter  Procedures   Pneumococcal polysaccharide vaccine 23-valent greater than or equal to 2yo subcutaneous/IM   Ambulatory referral to Gastroenterology    Referral Priority:   Routine    Referral Type:   Consultation    Referral Reason:   Specialty Services Required    Number of Visits Requested:   1    Patient instructions: Pneumovax today.  We will  refer you back to GI to discuss repeat colonoscopy.  You may call [facility] to schedule an appointment at [phone].   Call to schedule  mammogram at your convenience: [facility] [phone] Start vitamin D 50,000 units weekly - sent to pharmacy. Recheck levels in 6 months.  Good to see you today Return as needed or in 6 months for follow up visit.   Follow up plan: Return in about 6 months (around 10/10/2022), or if symptoms worsen or fail to improve, for follow up visit.  [name], MD

## 2022-04-09 NOTE — Assessment & Plan Note (Signed)
Chronic, continues atorvastatin '10mg'$  daily. Consider increasing dose given elevated trig levels. The 10-year ASCVD risk score (Arnett DK, et al., 2019) is: 20.2%   Values used to calculate the score:     Age: 76 years     Sex: Female     Is Non-Hispanic African American: No     Diabetic: No     Tobacco smoker: No     Systolic Blood Pressure: 715 mmHg     Is BP treated: Yes     HDL Cholesterol: 37.4 mg/dL     Total Cholesterol: 175 mg/dL

## 2022-04-10 MED ORDER — VITAMIN D3 1.25 MG (50000 UT) PO TABS: 1.0000 | ORAL_TABLET | ORAL | 1 refills | Status: DC

## 2022-04-15 DIAGNOSIS — H25811 Combined forms of age-related cataract, right eye: Secondary | ICD-10-CM | POA: Diagnosis not present

## 2022-04-15 DIAGNOSIS — Z01818 Encounter for other preprocedural examination: Secondary | ICD-10-CM | POA: Diagnosis not present

## 2022-04-15 DIAGNOSIS — H25812 Combined forms of age-related cataract, left eye: Secondary | ICD-10-CM | POA: Diagnosis not present

## 2022-04-16 ENCOUNTER — Other Ambulatory Visit: Payer: Self-pay | Admitting: Family Medicine

## 2022-04-16 DIAGNOSIS — Z1231 Encounter for screening mammogram for malignant neoplasm of breast: Secondary | ICD-10-CM

## 2022-04-25 DIAGNOSIS — H25812 Combined forms of age-related cataract, left eye: Secondary | ICD-10-CM | POA: Diagnosis not present

## 2022-04-25 DIAGNOSIS — H269 Unspecified cataract: Secondary | ICD-10-CM | POA: Diagnosis not present

## 2022-05-23 DIAGNOSIS — H25811 Combined forms of age-related cataract, right eye: Secondary | ICD-10-CM | POA: Diagnosis not present

## 2022-05-23 DIAGNOSIS — H269 Unspecified cataract: Secondary | ICD-10-CM | POA: Diagnosis not present

## 2022-05-31 ENCOUNTER — Ambulatory Visit
Admission: RE | Admit: 2022-05-31 | Discharge: 2022-05-31 | Disposition: A | Discharge status: Home / Self Care | Payer: Medicare Other | Source: Ambulatory Visit | Attending: Family Medicine | Admitting: Family Medicine

## 2022-05-31 DIAGNOSIS — Z1231 Encounter for screening mammogram for malignant neoplasm of breast: Secondary | ICD-10-CM | POA: Diagnosis not present

## 2022-06-06 ENCOUNTER — Telehealth: Payer: Self-pay

## 2022-06-06 NOTE — Telephone Encounter (Signed)
Prior auth started for Norvasc '5MG'$  tablets has been approved. Barron Alvine Key: [id number] - [place] Case ID: [id number] - Rx #: [id number]  [employer] has approved a request from you or your doctor for AMLODIPINE BESYLATE Tablet '5MG'$ . This approval is for 06/04/2022 to 09/01/2022.  Patient notified via mychart.  Approval letter sent to scanning.

## 2022-06-07 DIAGNOSIS — I10 Essential (primary) hypertension: Secondary | ICD-10-CM | POA: Diagnosis not present

## 2022-06-07 DIAGNOSIS — N343 Urethral syndrome, unspecified: Secondary | ICD-10-CM | POA: Diagnosis not present

## 2022-06-07 DIAGNOSIS — Z6825 Body mass index (BMI) 25.0-25.9, adult: Secondary | ICD-10-CM | POA: Diagnosis not present

## 2022-06-07 DIAGNOSIS — N390 Urinary tract infection, site not specified: Secondary | ICD-10-CM | POA: Diagnosis not present

## 2022-06-08 ENCOUNTER — Other Ambulatory Visit: Payer: Self-pay | Admitting: Family Medicine

## 2022-06-08 DIAGNOSIS — E7800 Pure hypercholesterolemia, unspecified: Secondary | ICD-10-CM

## 2022-06-10 NOTE — Telephone Encounter (Signed)
Resubmitted prior auth for Norvasc '5MG'$  tablets.   Barron Alvine KeyTressia Miners - [place] Case ID: [id number] Waiting for determination.

## 2022-06-10 NOTE — Telephone Encounter (Signed)
Prior auth approved for Norvasc '5MG'$  tablets. Barron Alvine Key: [id number] - [place] Case ID: [id number]  I called [facility] to notify them that this has been approved.  I spoke to Manuela Schwartz, who requested I fax a copy of the approval letter.  This was faxed to F# [phone].  Received fax confirmation.  Approval letter sent to scanning.  Patient notified via mychart.

## 2022-06-11 ENCOUNTER — Ambulatory Visit (INDEPENDENT_AMBULATORY_CARE_PROVIDER_SITE_OTHER): Payer: Medicare Other | Admitting: Gastroenterology

## 2022-06-11 ENCOUNTER — Encounter: Payer: Self-pay | Admitting: Gastroenterology

## 2022-06-11 VITALS — BP 130/60 | HR 72 | Ht 60.5 in | Wt 131.0 lb

## 2022-06-11 DIAGNOSIS — R933 Abnormal findings on diagnostic imaging of other parts of digestive tract: Secondary | ICD-10-CM | POA: Diagnosis not present

## 2022-06-11 DIAGNOSIS — K635 Polyp of colon: Secondary | ICD-10-CM | POA: Diagnosis not present

## 2022-06-11 DIAGNOSIS — Z86010 Personal history of colonic polyps: Secondary | ICD-10-CM | POA: Diagnosis not present

## 2022-06-11 MED ORDER — NA SULFATE-K SULFATE-MG SULF 17.5-3.13-1.6 GM/177ML PO SOLN: 1.0000 | ORAL | 0 refills | Status: DC

## 2022-06-11 NOTE — Progress Notes (Signed)
[place] VISIT   Primary Care Provider [name], MD [place] [address] [phone]   Patient Profile: Robin Peck is a 76 y.o. female with a pmh significant for hypertension, hyperlipidemia, anxiety, osteopenia, osteoarthritis/DJD, colon polyps (TA's and SSP).  The patient presents to the [facility] for an evaluation and management of problem(s) noted below:  Problem List 1. Hx of adenomatous colonic polyps   2. History of cecal polyp   3. Abnormal colonoscopy     History of Present Illness Please see prior notes for full details of HPI.  Interval History The patient returns with her husband for follow-up.  She was due for colon polyp surveillance in 2022.  A recall came up in 2022 but she and her husband were dealing with multiple other issues at the time.  She was referred by her primary care provider now and she is willing and ready to move forward with a repeat colonoscopy even though she is 76 years of age.  She has not had any other issues develop in the interim in regards to any changes in her bowel habits or any blood in her stools.  She is not experiencing any new upper or lower GI symptoms otherwise.  GI Review of Systems Positive as above Negative for dysphagia, odynophagia, nausea, vomiting  Review of Systems General: Denies fevers/chills/weight loss unintentionally Cardiovascular: Denies chest pain/palpitations Pulmonary: Denies shortness of breath Gastroenterological: See HPI Genitourinary: Denies darkened urine  Hematological: Denies easy bruising/bleeding Dermatological: Denies jaundice Psychological: Mood is stable   Medications Current Outpatient Medications  Medication Sig Dispense Refill   ALPRAZolam (XANAX) 0.25 MG tablet Take 1 tablet (0.25 mg total) by mouth 2 (two) times daily as needed for anxiety or sleep. 30 tablet 0   atorvastatin (LIPITOR) 10 MG tablet TAKE  1 TABLET BY MOUTH DAILY 90 tablet 3   Calcium Carb-Cholecalciferol (CALCIUM-VITAMIN D) 600-400 MG-UNIT TABS Take 1 tablet by mouth daily.     cetirizine (ZYRTEC) 10 MG tablet Take 1 tablet (10 mg total) by mouth daily as needed (seasonal allergies.).     Cholecalciferol (VITAMIN D3) 1.25 MG (50000 UT) TABS Take 1 tablet by mouth once a week. 12 tablet 1   famotidine (PEPCID) 20 MG tablet Take 1 tablet (20 mg total) by mouth at bedtime.     losartan (COZAAR) 100 MG tablet Take 1 tablet (100 mg total) by mouth daily. 90 tablet 3   Multiple Vitamins-Minerals (CENTRUM SILVER 50+WOMEN) TABS Take 1 tablet by mouth daily.     Na Sulfate-K Sulfate-Mg Sulf (SUPREP BOWEL PREP KIT) 17.5-3.13-1.6 GM/177ML SOLN Take 1 kit by mouth as directed. For colonoscopy prep 354 mL 0   nitrofurantoin, macrocrystal-monohydrate, (MACROBID) 100 MG capsule Take 100 mg by mouth every 12 (twelve) hours.     NORVASC 5 MG tablet Take 1 tablet (5 mg total) by mouth daily. 90 tablet 3   potassium chloride SA (KLOR-CON M20) 20 MEQ tablet Take 1 tablet (20 mEq total) by mouth daily. 90 tablet 3   propranolol ER (INDERAL LA) 60 MG 24 hr capsule TAKE 1 CAPSULE BY MOUTH DAILY 90 capsule 3   Zinc 30 MG CAPS Take 1 capsule (30 mg total) by mouth daily. 30 capsule    No current facility-administered medications for this visit.    Allergies Allergies  Allergen Reactions   Valacyclovir Hcl Other (See Comments) and Anaphylaxis    Possible cause of rash, was concurrently on gabapentin Possible  cause of rash, was concurrently on gabapentin   Gabapentin Rash    Possibly caused rash, was concurrently on valtrex.    Hydrochlorothiazide Other (See Comments)    Severe hyponatremia   Lisinopril Cough    Histories Past Medical History:  Diagnosis Date   Allergic rhinitis    Allergy    seasonal   Anxiety    Arthritis    COVID-19 virus infection 02/02/2021   Heart murmur    Hyperlipidemia    Hypertension    Past Surgical History:   Procedure Laterality Date   ABDOMINAL HYSTERECTOMY     CATARACT EXTRACTION, BILATERAL     COLONOSCOPY WITH PROPOFOL N/A 06/12/2020   3.5cm lesion removed piecemeal, smaller polyp removed, int hem ([place])   ENDOSCOPIC MUCOSAL RESECTION N/A 06/12/2020   Procedure: ENDOSCOPIC MUCOSAL RESECTION;  Surgeon: Irving Copas., MD;  Location: [place];  Service: Gastroenterology;  Laterality: N/A;   HEMOSTASIS CLIP PLACEMENT  06/12/2020   Procedure: HEMOSTASIS CLIP PLACEMENT;  Surgeon: Irving Copas., MD;  Location: [place];  Service: Gastroenterology;;   PARTIAL HYSTERECTOMY  2002   hysterectomy   POLYPECTOMY  06/12/2020   Procedure: POLYPECTOMY;  Surgeon: Rush Landmark Telford Nab., MD;  Location: [place];  Service: Gastroenterology;;   SUBMUCOSAL LIFTING INJECTION  06/12/2020   Procedure: SUBMUCOSAL LIFTING INJECTION;  Surgeon: Irving Copas., MD;  Location: [facility] ENDOSCOPY;  Service: Gastroenterology;;   Social History   Socioeconomic History   Marital status: Married    Spouse name: Not on file   Number of children: 1   Years of education: Not on file   Highest education level: Not on file  Occupational History   Not on file  Tobacco Use   Smoking status: Never   Smokeless tobacco: Never  Vaping Use   Vaping Use: Never used  Substance and Sexual Activity   Alcohol use: Never   Drug use: No   Sexual activity: Not on file  Other Topics Concern   Not on file  Social History Narrative   Lives with husband [name]. No children.    Social Determinants of Health   Financial Resource Strain: Low Risk  (03/22/2022)   Overall Financial Resource Strain (CARDIA)    Difficulty of Paying Living Expenses: Not hard at all  Food Insecurity: No Food Insecurity (03/22/2022)   Hunger Vital Sign    Worried About Running Out of Food in the Last Year: Never true    Ran Out of Food in the Last Year: Never true  Transportation Needs: No Transportation Needs  (03/22/2022)   PRAPARE - Hydrologist (Medical): No    Lack of Transportation (Non-Medical): No  Physical Activity: Insufficiently Active (03/22/2022)   Exercise Vital Sign    Days of Exercise per Week: 3 days    Minutes of Exercise per Session: 30 min  Stress: No Stress Concern Present (03/22/2022)   [place]    Feeling of Stress : Not at all  Social Connections: [place] (03/22/2022)   Social Connection and Isolation Panel [NHANES]    Frequency of Communication with Friends and Family: More than three times a week    Frequency of Social Gatherings with Friends and Family: More than three times a week    Attends Religious Services: More than 4 times per year    Active Member of [employer] or Organizations: Yes    Attends Archivist Meetings: More than 4 times  per year    Marital Status: Married  Human resources officer Violence: Not At Risk (03/22/2022)   Humiliation, Afraid, Rape, and Kick questionnaire    Fear of Current or Ex-Partner: No    Emotionally Abused: No    Physically Abused: No    Sexually Abused: No   Family History  Problem Relation Age of Onset   Heart attack Brother    Hypertension Brother    Hypertension Father    Hypertension Mother    Stroke Mother    Hypertension Sister    Heart attack Maternal Grandfather    Colon cancer Neg Hx    Stomach cancer Neg Hx    Esophageal cancer Neg Hx    Pancreatic cancer Neg Hx    Liver cancer Neg Hx    Inflammatory bowel disease Neg Hx    Rectal cancer Neg Hx    Colon polyps Neg Hx    I have reviewed her medical, social, and family history in detail and updated the electronic medical record as necessary.    PHYSICAL EXAMINATION  BP 130/60 (BP Location: Left Arm, Patient Position: Sitting, Cuff Size: Normal)   Pulse 72   Ht 5' 0.5" (1.537 m) Comment: measured without shoes  Wt 131 lb (59.4 kg)   BMI 25.16 kg/m   Wt Readings from Last 3 Encounters:  06/11/22 131 lb (59.4 kg)  04/09/22 129 lb 2 oz (58.6 kg)  03/22/22 124 lb 6.4 oz (56.4 kg)  GEN: NAD, appears stated age, doesn't appear chronically ill, accompanied by husband PSYCH: Cooperative, without pressured speech EYE: Conjunctivae pink, sclerae anicteric ENT: MMM CV: Nontachycardic RESP: No audible wheezing GI: Soft oft, NT/ND, without rebound or guarding MSK/EXT: No lower extremity edema SKIN: No jaundice NEURO:  Alert & Oriented x 3, no focal deficits   REVIEW OF DATA  I reviewed the following data at the time of this encounter:  GI Procedures and Studies  October 2021 colonoscopy - Hemorrhoids found on digital rectal exam. - The examined portion of the ileum was normal. - One 35 mm polyp in the cecum, removed with piecemeal mucosal resection. Resected and retrieved. Treated lateral margin with STSC. Potential submucosal defect was noted but plan had been to close defect in any case, so clips (MR conditional) were placed to approximate the entire resection margin with good effect. - One 4 mm polyp in the cecum, removed with a cold snare. Resected and retrieved. - Normal mucosa in the entire examined colon otherwise. - Non-bleeding non-thrombosed internal hemorrhoids.  Pathology FINAL MICROSCOPIC DIAGNOSIS:  A. COLON, CECAL EMR, POLYPECTOMY:  -  Multiple fragments of tubular adenoma(s)  -  No high grade dysplasia or malignancy identified  B. COLON, CECAL, POLYPECTOMY:  -  Sessile serrated polyp (2 fragments)  -  Multiple fragments of benign colonic mucosa  -  No high grade dysplasia or malignancy identified   Laboratory Studies  Reviewed those in epic  Imaging Studies  No relevant studies to review   ASSESSMENT  Ms. Garmon is a 76 y.o. female with a pmh significant for hypertension, hyperlipidemia, anxiety, osteopenia, osteoarthritis/DJD, colon polyps (TA's and SSP).  The patient is seen today for evaluation and  management of:  1. Hx of adenomatous colonic polyps   2. History of cecal polyp   3. Abnormal colonoscopy    The patient is hemodynamically and clinically stable.  No new GI symptoms have developed over the course of the last 2 years.  She is overdue for colon polyp surveillance  in the setting of her advanced adenoma.  She remains a candidate for colon polyp surveillance in the setting of her overall otherwise good health.  The risks and benefits of endoscopic evaluation were discussed with the patient; these include but are not limited to the risk of perforation, infection, bleeding, missed lesions, lack of diagnosis, severe illness requiring hospitalization, as well as anesthesia and sedation related illnesses.  The patient and/or family is agreeable to proceed.  All patient questions were answered to the best of my ability, and the patient agrees to the aforementioned plan of action with follow-up as indicated.   PLAN  Proceed with scheduling colonoscopy for colon polyp surveillance Hopefully she will not need another colonoscopy for 3 years thereafter but time will tell   Orders Placed This Encounter  Procedures   Procedural/ Surgical Case Request: COLONOSCOPY WITH PROPOFOL, ENDOSCOPIC MUCOSAL RESECTION   Ambulatory referral to Gastroenterology    New Prescriptions   NA SULFATE-K SULFATE-MG SULF (SUPREP BOWEL PREP KIT) 17.5-3.13-1.6 GM/177ML SOLN    Take 1 kit by mouth as directed. For colonoscopy prep   Modified Medications   No medications on file    Planned Follow Up No follow-ups on file.   Total Time in Face-to-Face and in Coordination of Care for patient including independent/personal interpretation/review of prior testing, medical history, examination, medication adjustment, communicating results with the patient directly, and documentation with the EHR is 25 minutes.   Justice Britain, MD [facility] Advanced [facility]oscopy Office # [phone]

## 2022-06-11 NOTE — Patient Instructions (Signed)
You have been scheduled for a colonoscopy. Please follow written instructions given to you at your visit today.  Please pick up your prep supplies at the pharmacy within the next 1-3 days. If you use inhalers (even only as needed), please bring them with you on the day of your procedure.  We have sent the following medications to your pharmacy for you to pick up at your convenience: Suprep   _______________________________________________________  If you are age 76 or older, your body mass index should be between 23-30. Your Body mass index is 25.16 kg/m. If this is out of the aforementioned range listed, please consider follow up with your Primary Care Provider.  If you are age 73 or younger, your body mass index should be between 19-25. Your Body mass index is 25.16 kg/m. If this is out of the aformentioned range listed, please consider follow up with your Primary Care Provider.   ________________________________________________________  The [facility] providers would like to encourage you to use [facility] to communicate with providers for non-urgent requests or questions.  Due to long hold times on the telephone, sending your provider a message by [facility] may be a faster and more efficient way to get a response.  Please allow 48 business hours for a response.  Please remember that this is for non-urgent requests.  _______________________________________________________  Thank you for choosing me and [facility].  Dr. Rush Landmark

## 2022-07-03 HISTORY — PX: COLONOSCOPY: SHX174

## 2022-07-10 ENCOUNTER — Encounter (HOSPITAL_COMMUNITY): Payer: Self-pay | Admitting: Gastroenterology

## 2022-07-13 DIAGNOSIS — Z23 Encounter for immunization: Secondary | ICD-10-CM | POA: Diagnosis not present

## 2022-07-18 ENCOUNTER — Ambulatory Visit (HOSPITAL_BASED_OUTPATIENT_CLINIC_OR_DEPARTMENT_OTHER): Payer: Medicare Other | Admitting: Certified Registered Nurse Anesthetist

## 2022-07-18 ENCOUNTER — Encounter (HOSPITAL_COMMUNITY)
Admission: RE | Disposition: A | Discharge status: Home / Self Care | Payer: Self-pay | Source: Ambulatory Visit | Attending: Gastroenterology

## 2022-07-18 ENCOUNTER — Ambulatory Visit (HOSPITAL_COMMUNITY)
Admission: RE | Admit: 2022-07-18 | Discharge: 2022-07-18 | Disposition: A | Discharge status: Home / Self Care | Payer: Medicare Other | Source: Ambulatory Visit | Attending: Gastroenterology | Admitting: Gastroenterology

## 2022-07-18 ENCOUNTER — Other Ambulatory Visit: Payer: Self-pay

## 2022-07-18 ENCOUNTER — Ambulatory Visit (HOSPITAL_COMMUNITY): Payer: Medicare Other | Admitting: Certified Registered Nurse Anesthetist

## 2022-07-18 ENCOUNTER — Telehealth: Payer: Self-pay

## 2022-07-18 ENCOUNTER — Encounter (HOSPITAL_COMMUNITY): Payer: Self-pay | Admitting: Gastroenterology

## 2022-07-18 DIAGNOSIS — M1990 Unspecified osteoarthritis, unspecified site: Secondary | ICD-10-CM

## 2022-07-18 DIAGNOSIS — Z09 Encounter for follow-up examination after completed treatment for conditions other than malignant neoplasm: Secondary | ICD-10-CM

## 2022-07-18 DIAGNOSIS — F419 Anxiety disorder, unspecified: Secondary | ICD-10-CM | POA: Diagnosis not present

## 2022-07-18 DIAGNOSIS — K635 Polyp of colon: Secondary | ICD-10-CM

## 2022-07-18 DIAGNOSIS — Z1211 Encounter for screening for malignant neoplasm of colon: Secondary | ICD-10-CM | POA: Insufficient documentation

## 2022-07-18 DIAGNOSIS — Z86010 Personal history of colonic polyps: Secondary | ICD-10-CM

## 2022-07-18 DIAGNOSIS — D123 Benign neoplasm of transverse colon: Secondary | ICD-10-CM

## 2022-07-18 DIAGNOSIS — I10 Essential (primary) hypertension: Secondary | ICD-10-CM | POA: Insufficient documentation

## 2022-07-18 DIAGNOSIS — K644 Residual hemorrhoidal skin tags: Secondary | ICD-10-CM | POA: Diagnosis not present

## 2022-07-18 DIAGNOSIS — K641 Second degree hemorrhoids: Secondary | ICD-10-CM

## 2022-07-18 DIAGNOSIS — D124 Benign neoplasm of descending colon: Secondary | ICD-10-CM | POA: Diagnosis not present

## 2022-07-18 DIAGNOSIS — Z860101 Personal history of adenomatous and serrated colon polyps: Secondary | ICD-10-CM

## 2022-07-18 DIAGNOSIS — R933 Abnormal findings on diagnostic imaging of other parts of digestive tract: Secondary | ICD-10-CM

## 2022-07-18 DIAGNOSIS — K6389 Other specified diseases of intestine: Secondary | ICD-10-CM | POA: Diagnosis not present

## 2022-07-18 HISTORY — PX: COLONOSCOPY WITH PROPOFOL: SHX5780

## 2022-07-18 HISTORY — PX: POLYPECTOMY: SHX5525

## 2022-07-18 SURGERY — COLONOSCOPY WITH PROPOFOL
Anesthesia: Monitor Anesthesia Care

## 2022-07-18 MED ORDER — PROPOFOL 500 MG/50ML IV EMUL
INTRAVENOUS | Status: DC | PRN
  Administered 2022-07-18: 125 ug/kg/min via INTRAVENOUS
  Administered 2022-07-18 (×2): 20 mg via INTRAVENOUS

## 2022-07-18 MED ORDER — PROPOFOL 500 MG/50ML IV EMUL
INTRAVENOUS | Status: AC
  Filled 2022-07-18: qty 50

## 2022-07-18 MED ORDER — PROPOFOL 1000 MG/100ML IV EMUL
INTRAVENOUS | Status: AC
  Filled 2022-07-18: qty 100

## 2022-07-18 MED ORDER — LIDOCAINE 2% (20 MG/ML) 5 ML SYRINGE
INTRAMUSCULAR | Status: DC | PRN
  Administered 2022-07-18: 60 mg via INTRAVENOUS

## 2022-07-18 MED ORDER — LACTATED RINGERS IV SOLN: INTRAVENOUS | Status: DC

## 2022-07-18 MED ORDER — SODIUM CHLORIDE 0.9 % IV SOLN: INTRAVENOUS | Status: DC

## 2022-07-18 SURGICAL SUPPLY — 22 items

## 2022-07-18 NOTE — Telephone Encounter (Signed)
Appt has been made for 09/17/22 at 150 pm with GM  Letter mailed to the pt

## 2022-07-18 NOTE — Discharge Instructions (Signed)
YOU HAD AN ENDOSCOPIC PROCEDURE TODAY: Refer to the procedure report and other information in the discharge instructions given to you for any specific questions about what was found during the examination. If this information does not answer your questions, please call [place] office at [phone] to clarify.  ° °YOU SHOULD EXPECT: Some feelings of bloating in the abdomen. Passage of more gas than usual. Walking can help get rid of the air that was put into your GI tract during the procedure and reduce the bloating. If you had a lower endoscopy (such as a colonoscopy or flexible sigmoidoscopy) you may notice spotting of blood in your stool or on the toilet paper. Some abdominal soreness may be present for a day or two, also. ° °DIET: Your first meal following the procedure should be a light meal and then it is ok to progress to your normal diet. A half-sandwich or bowl of soup is an example of a good first meal. Heavy or fried foods are harder to digest and may make you feel nauseous or bloated. Drink plenty of fluids but you should avoid alcoholic beverages for 24 hours. If you had a esophageal dilation, please see attached instructions for diet.   ° °ACTIVITY: Your care partner should take you home directly after the procedure. You should plan to take it easy, moving slowly for the rest of the day. You can resume normal activity the day after the procedure however YOU SHOULD NOT DRIVE, use power tools, machinery or perform tasks that involve climbing or major physical exertion for 24 hours (because of the sedation medicines used during the test).  ° °SYMPTOMS TO REPORT IMMEDIATELY: °A gastroenterologist can be reached at any hour. Please call [phone]  for any of the following symptoms:  °Following lower endoscopy (colonoscopy, flexible sigmoidoscopy) °Excessive amounts of blood in the stool  °Significant tenderness, worsening of abdominal pains  °Swelling of the abdomen that is new, acute  °Fever of 100° or  higher  °Following upper endoscopy (EGD, EUS, ERCP, esophageal dilation) °Vomiting of blood or coffee ground material  °New, significant abdominal pain  °New, significant chest pain or pain under the shoulder blades  °Painful or persistently difficult swallowing  °New shortness of breath  °Black, tarry-looking or red, bloody stools ° °FOLLOW UP:  °If any biopsies were taken you will be contacted by phone or by letter within the next 1-3 weeks. Call [phone]  if you have not heard about the biopsies in 3 weeks.  °Please also call with any specific questions about appointments or follow up tests. ° °

## 2022-07-18 NOTE — Anesthesia Postprocedure Evaluation (Signed)
Anesthesia Post Note  Patient: Robin Peck  Procedure(s) Performed: COLONOSCOPY WITH PROPOFOL ENDOSCOPIC MUCOSAL RESECTION POLYPECTOMY     Patient location during evaluation: PACU Anesthesia Type: MAC Level of consciousness: awake and alert Pain management: pain level controlled Vital Signs Assessment: post-procedure vital signs reviewed and stable Respiratory status: spontaneous breathing, nonlabored ventilation and respiratory function stable Cardiovascular status: blood pressure returned to baseline and stable Postop Assessment: no apparent nausea or vomiting Anesthetic complications: no   No notable events documented.  Last Vitals:  Vitals:   07/18/22 1132 07/18/22 1142  BP: 100/76 (!) 126/50  Pulse: 60 (!) 58  Resp: 17 16  Temp:    SpO2: 98% 100%    Last Pain:  Vitals:   07/18/22 1142  TempSrc:   PainSc: 0-No pain                 Lynda Rainwater

## 2022-07-18 NOTE — Op Note (Signed)
[facility] Patient Name: Robin Peck Procedure Date: 07/18/2022 MRN: [id number] Attending MD: Justice Britain , MD, [id number] Date of Birth: 01-Feb-1946 CSN: [id number] Age: 76 Admit Type: Outpatient Procedure:                Colonoscopy Indications:              Surveillance: History of piecemeal removal adenoma                            on last colonoscopy (< 3 yrs) Providers:                Justice Britain, MD, Kary Kos RN, RN, Hinton Dyer Technician, Technician Referring MD:             [name] Medicines:                Monitored Anesthesia Care Complications:            No immediate complications. Estimated Blood Loss:     Estimated blood loss was minimal. Procedure:                Pre-Anesthesia Assessment:                           - Prior to the procedure, a History and Physical                            was performed, and patient medications and                            allergies were reviewed. The patient's tolerance of                            previous anesthesia was also reviewed. The risks                            and benefits of the procedure and the sedation                            options and risks were discussed with the patient.                            All questions were answered, and informed consent                            was obtained. Prior Anticoagulants: The patient has                            taken no anticoagulant or antiplatelet agents. ASA                            Grade Assessment: III - A patient with severe  systemic disease. After reviewing the risks and                            benefits, the patient was deemed in satisfactory                            condition to undergo the procedure.                           After obtaining informed consent, the colonoscope                            was passed under direct vision. Throughout the                             procedure, the patient's blood pressure, pulse, and                            oxygen saturations were monitored continuously. The                            [id number]-HQ190L ([id number]) Olympus colonoscope was                            introduced through the anus and advanced to the the                            cecum, identified by the appendiceal orifice. The                            colonoscopy was performed without difficulty. The                            patient tolerated the procedure. The quality of the                            bowel preparation was good. The terminal ileum,                            ileocecal valve, appendiceal orifice, and rectum                            were photographed. Scope In: 11:04:28 AM Scope Out: 11:15:25 AM Scope Withdrawal Time: 0 hours 8 minutes 57 seconds  Total Procedure Duration: 0 hours 10 minutes 57 seconds  Findings:      The digital rectal exam findings include hemorrhoids. Pertinent       negatives include no palpable rectal lesions.      The terminal ileum and ileocecal valve appeared normal.      A medium post mucosectomy scar was found in the cecum. The scar tissue       was healthy in appearance. There was no evidence of the previous polyp.      Two sessile polyps were found in the descending colon and hepatic       flexure. The  polyps were 3 to 4 mm in size. These polyps were removed       with a cold snare. Resection and retrieval were complete.      Normal mucosa was found in the entire colon otherwise.      Non-bleeding non-thrombosed external and internal hemorrhoids were found       during retroflexion, during perianal exam and during digital exam. The       hemorrhoids were Grade II (internal hemorrhoids that prolapse but reduce       spontaneously). Impression:               - Hemorrhoids found on digital rectal exam.                           - The examined portion of the ileum was normal.                            - Post mucosectomy scar in the cecum.                           - Two 3 to 4 mm polyps in the descending colon and                            at the hepatic flexure, removed with a cold snare.                            Resected and retrieved.                           - Normal mucosa in the entire examined colon                            otherwise.                           - Non-bleeding non-thrombosed external and internal                            hemorrhoids. Moderate Sedation:      Not Applicable - Patient had care per Anesthesia. Recommendation:           - The patient will be observed post-procedure,                            until all discharge criteria are met.                           - Discharge patient to home.                           - Patient has a contact number available for                            emergencies. The signs and symptoms of potential  delayed complications were discussed with the                            patient. Return to normal activities tomorrow.                            Written discharge instructions were provided to the                            patient.                           - High fiber diet.                           - Use FiberCon 1-2 tablets PO daily.                           - Await pathology results.                           - Repeat colonoscopy in 3 years for surveillance                            due to previous advanced adenoma though things                            today look quite good.                           - The findings and recommendations were discussed                            with the patient.                           - The findings and recommendations were discussed                            with the patient's family. Procedure Code(s):        --- Professional ---                           [phone], Colonoscopy, flexible; with removal of                             tumor(s), polyp(s), or other lesion(s) by snare                            technique Diagnosis Code(s):        --- Professional ---                           Z86.010, Personal history of colonic polyps                           K64.1, Second degree  hemorrhoids                           Z98.890, Other specified postprocedural states                           D12.4, Benign neoplasm of descending colon                           D12.3, Benign neoplasm of transverse colon (hepatic                            flexure or splenic flexure) CPT copyright 2022 American Medical Association. All rights reserved. The codes documented in this report are preliminary and upon coder review may  be revised to meet current compliance requirements. Justice Britain, MD 07/18/2022 11:25:13 AM Number of Addenda: 0

## 2022-07-18 NOTE — Transfer of Care (Signed)
Immediate Anesthesia Transfer of Care Note  Patient: Robin Peck  Procedure(s) Performed: COLONOSCOPY WITH PROPOFOL ENDOSCOPIC MUCOSAL RESECTION POLYPECTOMY  Patient Location: Endoscopy Unit  Anesthesia Type:MAC  Level of Consciousness: drowsy  Airway & Oxygen Therapy: Patient Spontanous Breathing and Patient connected to face mask oxygen  Post-op Assessment: Report given to RN and Post -op Vital signs reviewed and stable  Post vital signs: Reviewed and stable  Last Vitals:  Vitals Value Taken Time  BP    Temp    Pulse    Resp    SpO2      Last Pain:  Vitals:   07/18/22 0846  TempSrc: Temporal  PainSc: 0-No pain         Complications: No notable events documented.

## 2022-07-18 NOTE — Anesthesia Procedure Notes (Signed)
Procedure Name: MAC Date/Time: 07/18/2022 10:43 AM  Performed by: Maxwell Caul, CRNAPre-anesthesia Checklist: Patient identified, Emergency Drugs available, Suction available and Patient being monitored Oxygen Delivery Method: Simple face mask

## 2022-07-18 NOTE — Telephone Encounter (Signed)
-----   Message from Irving Copas., MD sent at 07/18/2022 11:27 AM EST ----- Regarding: Follow up Robin Peck, Set up follow up in clinic in next 2-3 months with APP or myself to discuss constipation. Thanks. GM

## 2022-07-18 NOTE — H&P (Signed)
GASTROENTEROLOGY PROCEDURE H&P NOTE   Primary Care Physician: [name], MD  HPI: Robin Peck is a 76 y.o. female who presents for colonoscopy for surveillance of previous cecal adenoma removed in 2021.  Past Medical History:  Diagnosis Date   Allergic rhinitis    Allergy    seasonal   Anxiety    Arthritis    COVID-19 virus infection 02/02/2021   Heart murmur    Hyperlipidemia    Hypertension    Past Surgical History:  Procedure Laterality Date   ABDOMINAL HYSTERECTOMY     CATARACT EXTRACTION, BILATERAL     COLONOSCOPY WITH PROPOFOL N/A 06/12/2020   3.5cm lesion removed piecemeal, smaller polyp removed, int hem (Mansouraty)   ENDOSCOPIC MUCOSAL RESECTION N/A 06/12/2020   Procedure: ENDOSCOPIC MUCOSAL RESECTION;  Surgeon: Irving Copas., MD;  Location: [place];  Service: Gastroenterology;  Laterality: N/A;   HEMOSTASIS CLIP PLACEMENT  06/12/2020   Procedure: HEMOSTASIS CLIP PLACEMENT;  Surgeon: Irving Copas., MD;  Location: [place];  Service: Gastroenterology;;   PARTIAL HYSTERECTOMY  2002   hysterectomy   POLYPECTOMY  06/12/2020   Procedure: POLYPECTOMY;  Surgeon: Rush Landmark Telford Nab., MD;  Location: [place];  Service: Gastroenterology;;   SUBMUCOSAL LIFTING INJECTION  06/12/2020   Procedure: SUBMUCOSAL LIFTING INJECTION;  Surgeon: Irving Copas., MD;  Location: [place];  Service: Gastroenterology;;   Current Facility-Administered Medications  Medication Dose Route Frequency Provider Last Rate Last Admin   0.9 %  sodium chloride infusion   Intravenous Continuous Mansouraty, Telford Nab., MD       lactated ringers infusion   Intravenous Continuous Mansouraty, Telford Nab., MD 10 mL/hr at 07/18/22 0854 New Bag at 07/18/22 0854    Current Facility-Administered Medications:    0.9 %  sodium chloride infusion, , Intravenous, Continuous, Mansouraty, Telford Nab., MD   lactated ringers infusion, , Intravenous,  Continuous, Mansouraty, Telford Nab., MD, Last Rate: 10 mL/hr at 07/18/22 0854, New Bag at 07/18/22 0854 Allergies  Allergen Reactions   Valacyclovir Hcl Anaphylaxis and Other (See Comments)    Possible cause of rash, was concurrently on gabapentin    Gabapentin Rash    Possibly caused rash, was concurrently on valtrex.    Hydrochlorothiazide Other (See Comments)    Severe hyponatremia   Lisinopril Cough   Family History  Problem Relation Age of Onset   Heart attack Brother    Hypertension Brother    Hypertension Father    Hypertension Mother    Stroke Mother    Hypertension Sister    Heart attack Maternal Grandfather    Colon cancer Neg Hx    Stomach cancer Neg Hx    Esophageal cancer Neg Hx    Pancreatic cancer Neg Hx    Liver cancer Neg Hx    Inflammatory bowel disease Neg Hx    Rectal cancer Neg Hx    Colon polyps Neg Hx    Social History   Socioeconomic History   Marital status: Married    Spouse name: Not on file   Number of children: 1   Years of education: Not on file   Highest education level: Not on file  Occupational History   Not on file  Tobacco Use   Smoking status: Never   Smokeless tobacco: Never  Vaping Use   Vaping Use: Never used  Substance and Sexual Activity   Alcohol use: Never   Drug use: No   Sexual activity: Not on file  Other Topics Concern  Not on file  Social History Narrative   Lives with husband [name]. No children.    Social Determinants of Health   Financial Resource Strain: Low Risk  (03/22/2022)   Overall Financial Resource Strain (CARDIA)    Difficulty of Paying Living Expenses: Not hard at all  Food Insecurity: No Food Insecurity (03/22/2022)   Hunger Vital Sign    Worried About Running Out of Food in the Last Year: Never true    Ran Out of Food in the Last Year: Never true  Transportation Needs: No Transportation Needs (03/22/2022)   PRAPARE - Hydrologist (Medical): No    Lack of  Transportation (Non-Medical): No  Physical Activity: Insufficiently Active (03/22/2022)   Exercise Vital Sign    Days of Exercise per Week: 3 days    Minutes of Exercise per Session: 30 min  Stress: No Stress Concern Present (03/22/2022)   [place]    Feeling of Stress : Not at all  Social Connections: [place] (03/22/2022)   Social Connection and Isolation Panel [NHANES]    Frequency of Communication with Friends and Family: More than three times a week    Frequency of Social Gatherings with Friends and Family: More than three times a week    Attends Religious Services: More than 4 times per year    Active Member of Clubs or Organizations: Yes    Attends Archivist Meetings: More than 4 times per year    Marital Status: Married  Human resources officer Violence: Not At Risk (03/22/2022)   Humiliation, Afraid, Rape, and Kick questionnaire    Fear of Current or Ex-Partner: No    Emotionally Abused: No    Physically Abused: No    Sexually Abused: No    Physical Exam: Today's Vitals   07/10/22 1133 07/18/22 0846  BP:  (!) 145/68  Pulse:  69  Resp:  14  Temp:  97.9 F (36.6 C)  TempSrc:  Temporal  SpO2:  100%  Weight: 58.1 kg 56.7 kg  Height:  5' (1.524 m)  PainSc:  0-No pain   Body mass index is 24.41 kg/m. GEN: NAD EYE: Sclerae anicteric ENT: MMM CV: Non-tachycardic GI: Soft, NT/ND NEURO:  Alert & Oriented x 3  Lab Results: No results for input(s): "WBC", "HGB", "HCT", "PLT" in the last 72 hours. BMET No results for input(s): "NA", "K", "CL", "CO2", "GLUCOSE", "BUN", "CREATININE", "CALCIUM" in the last 72 hours. LFT No results for input(s): "PROT", "ALBUMIN", "AST", "ALT", "ALKPHOS", "BILITOT", "BILIDIR", "IBILI" in the last 72 hours. PT/INR No results for input(s): "LABPROT", "INR" in the last 72 hours.   Impression / Plan: This is a 76 y.o.female who presents for colonoscopy for  surveillance of previous cecal adenoma removed in 2021.  The risks and benefits of endoscopic evaluation/treatment were discussed with the patient and/or family; these include but are not limited to the risk of perforation, infection, bleeding, missed lesions, lack of diagnosis, severe illness requiring hospitalization, as well as anesthesia and sedation related illnesses.  The patient's history has been reviewed, patient examined, no change in status, and deemed stable for procedure.  The patient and/or family is agreeable to proceed.    Justice Britain, MD [facility] Advanced Endoscopy Office # [phone]

## 2022-07-18 NOTE — Anesthesia Preprocedure Evaluation (Signed)
Anesthesia Evaluation  Patient identified by MRN, date of birth, ID band Patient awake    Reviewed: Allergy & Precautions, H&P , NPO status , Patient's Chart, lab work & pertinent test results, reviewed documented beta blocker date and time   Airway Mallampati: II  TM Distance: >3 FB Neck ROM: Full    Dental no notable dental hx. (+) Teeth Intact, Dental Advisory Given   Pulmonary neg pulmonary ROS   Pulmonary exam normal breath sounds clear to auscultation- rhonchi       Cardiovascular Exercise Tolerance: Good hypertension, Pt. on medications and Pt. on home beta blockers  Rhythm:Regular Rate:Normal  EKG 09/2019  Sinus tachycardia   Neuro/Psych  Headaches PSYCHIATRIC DISORDERS Anxiety        GI/Hepatic negative GI ROS, Neg liver ROS,,,  Endo/Other  negative endocrine ROS    Renal/GU negative Renal ROS  negative genitourinary   Musculoskeletal  (+) Arthritis , Osteoarthritis,    Abdominal   Peds  Hematology negative hematology ROS (+)   Anesthesia Other Findings   Reproductive/Obstetrics negative OB ROS                             Anesthesia Physical Anesthesia Plan  ASA: II  Anesthesia Plan: MAC   Post-op Pain Management: Minimal or no pain anticipated   Induction: Intravenous  PONV Risk Score and Plan: 2 and Propofol infusion, TIVA and Treatment may vary due to age or medical condition  Airway Management Planned: Simple Face Mask and Natural Airway  Additional Equipment:   Intra-op Plan:   Post-operative Plan:   Informed Consent: I have reviewed the patients History and Physical, chart, labs and discussed the procedure including the risks, benefits and alternatives for the proposed anesthesia with the patient or authorized representative who has indicated his/her understanding and acceptance.     Dental advisory given  Plan Discussed with: CRNA  Anesthesia Plan  Comments:         Anesthesia Quick Evaluation

## 2022-07-19 ENCOUNTER — Encounter: Payer: Self-pay | Admitting: Family Medicine

## 2022-07-19 LAB — SURGICAL PATHOLOGY

## 2022-07-21 ENCOUNTER — Encounter (HOSPITAL_COMMUNITY): Payer: Self-pay | Admitting: Gastroenterology

## 2022-07-23 ENCOUNTER — Encounter: Payer: Self-pay | Admitting: Gastroenterology

## 2022-09-17 ENCOUNTER — Encounter: Payer: Self-pay | Admitting: Gastroenterology

## 2022-09-17 ENCOUNTER — Ambulatory Visit (INDEPENDENT_AMBULATORY_CARE_PROVIDER_SITE_OTHER): Payer: Medicare Other | Admitting: Gastroenterology

## 2022-09-17 VITALS — HR 67 | Ht 60.0 in | Wt 132.0 lb

## 2022-09-17 DIAGNOSIS — Z86010 Personal history of colonic polyps: Secondary | ICD-10-CM

## 2022-09-17 DIAGNOSIS — K5900 Constipation, unspecified: Secondary | ICD-10-CM | POA: Diagnosis not present

## 2022-09-17 NOTE — Patient Instructions (Signed)
Toileting tips to help with your constipation - Drink at least 64-80 ounces of water/liquid per day. - Establish a time to try to move your bowels every day.  For many people, this is after a cup of coffee or after a meal such as breakfast. - Sit all of the way back on the toilet keeping your back fairly straight and while sitting up, try to rest the tops of your forearms on your upper thighs.   - Raising your feet with a step stool/squatty potty can be helpful to improve the angle that allows your stool to pass through the rectum. - Relax the rectum feeling it bulge toward the toilet water.  If you feel your rectum raising toward your body, you are contracting rather than relaxing. - Breathe in and slowly exhale. "Belly breath" by expanding your belly towards your belly button. Keep belly expanded as you gently direct pressure down and back to the anus.  A low pitched GRRR sound can assist with increasing intra-abdominal pressure.  - Repeat 3-4 times. If unsuccessful, contract the pelvic floor to restore normal tone and get off the toilet.  Avoid excessive straining. - To reduce excessive wiping by teaching your anus to normally contract, place hands on outer aspect of knees and resist knee movement outward.  Hold 5-10 second then place hands just inside of knees and resist inward movement of knees.  Hold 5 seconds.  Repeat a few times each way.  Thank you for choosing me and [facility].  Dr. Rush Landmark

## 2022-09-17 NOTE — Progress Notes (Signed)
Reading VISIT   Primary Care Provider [name], MD [place] [address] [phone]   Patient Profile: Robin Peck is a 77 y.o. female with a pmh significant for hypertension, hyperlipidemia, anxiety, osteopenia, osteoarthritis/DJD, colon polyps (TA's and SSP).  The patient presents to the [facility] for an evaluation and management of problem(s) noted below:  Problem List 1. Hx of adenomatous colonic polyps   2. Constipation, unspecified constipation type     History of Present Illness Please see prior notes for full details of HPI.  Interval History The patient returns for follow-up.  She is unaccompanied today.  She states that she is doing much better since her colonoscopy.  She is trying to maintain a very high-fiber diet.  She does get in at least 3 bottles worth of fluids per day and sometimes even up to 4 bottles.  She is having bowel movements regularly.  She is not having any blood in her stools.  She is feeling less bloated and having less abdominal discomfort.  No other issues per her report.    GI Review of Systems Positive as above Negative for dysphagia, odynophagia, pain, nausea, vomiting, melena, hematochezia  Review of Systems General: Denies fevers/chills/weight loss unintentionally Cardiovascular: Denies chest pain Pulmonary: Denies shortness of breath Gastroenterological: See HPI Genitourinary: Denies darkened urine  Hematological: Denies easy bruising/bleeding Dermatological: Denies jaundice Psychological: Mood is stable   Medications Current Outpatient Medications  Medication Sig Dispense Refill   ALPRAZolam (XANAX) 0.25 MG tablet Take 1 tablet (0.25 mg total) by mouth 2 (two) times daily as needed for anxiety or sleep. 30 tablet 0   ascorbic acid (VITAMIN C) 500 MG tablet Take 500 mg by mouth daily in the afternoon.     atorvastatin (LIPITOR) 10 MG tablet TAKE 1  TABLET BY MOUTH DAILY (Patient taking differently: Take 10 mg by mouth at bedtime.) 90 tablet 3   cetirizine (ZYRTEC) 10 MG tablet Take 1 tablet (10 mg total) by mouth daily as needed (seasonal allergies.). (Patient taking differently: Take 10 mg by mouth in the morning.)     Cholecalciferol (VITAMIN D3) 1.25 MG (50000 UT) TABS Take 1 tablet by mouth once a week. (Patient taking differently: Take 1 tablet by mouth every Thursday.) 12 tablet 1   famotidine (PEPCID) 20 MG tablet Take 1 tablet (20 mg total) by mouth at bedtime.     losartan (COZAAR) 100 MG tablet Take 1 tablet (100 mg total) by mouth daily. (Patient taking differently: Take 100 mg by mouth at bedtime.) 90 tablet 3   NORVASC 5 MG tablet Take 1 tablet (5 mg total) by mouth daily. 90 tablet 3   OVER THE COUNTER MEDICATION Take 1 tablet by mouth daily in the afternoon. SUPER BEETS     potassium chloride SA (KLOR-CON M20) 20 MEQ tablet Take 1 tablet (20 mEq total) by mouth daily. 90 tablet 3   propranolol ER (INDERAL LA) 60 MG 24 hr capsule TAKE 1 CAPSULE BY MOUTH DAILY 90 capsule 3   TURMERIC PO Take 1 tablet by mouth daily in the afternoon. GUMMIES     Zinc 30 MG CAPS Take 1 capsule (30 mg total) by mouth daily. (Patient taking differently: Take 1 capsule by mouth daily in the afternoon.) 30 capsule    No current facility-administered medications for this visit.    Allergies Allergies  Allergen Reactions   Valacyclovir Hcl Anaphylaxis and Other (See Comments)    Possible  cause of rash, was concurrently on gabapentin    Gabapentin Rash    Possibly caused rash, was concurrently on valtrex.    Hydrochlorothiazide Other (See Comments)    Severe hyponatremia   Lisinopril Cough    Histories Past Medical History:  Diagnosis Date   Allergic rhinitis    Allergy    seasonal   Anxiety    Arthritis    COVID-19 virus infection 02/02/2021   Heart murmur    Hyperlipidemia    Hypertension    Past Surgical History:  Procedure  Laterality Date   ABDOMINAL HYSTERECTOMY     CATARACT EXTRACTION, BILATERAL     COLONOSCOPY  07/2022   benign polyp, rpt 3 yrs (Mansouraty)   COLONOSCOPY WITH PROPOFOL N/A 06/12/2020   3.5cm lesion removed piecemeal, smaller polyp removed, int hem (Mansouraty)   COLONOSCOPY WITH PROPOFOL N/A 07/18/2022   polyp - benign, hemorrhoids, rpt 3 yrs given large adenoma hx (Mansouraty, Telford Nab., MD)   ENDOSCOPIC MUCOSAL RESECTION N/A 06/12/2020   Procedure: ENDOSCOPIC MUCOSAL RESECTION;  Surgeon: Irving Copas., MD;  Location: [place];  Service: Gastroenterology;  Laterality: N/A;   HEMOSTASIS CLIP PLACEMENT  06/12/2020   Procedure: HEMOSTASIS CLIP PLACEMENT;  Surgeon: Irving Copas., MD;  Location: [place];  Service: Gastroenterology;;   PARTIAL HYSTERECTOMY  2002   hysterectomy   POLYPECTOMY  06/12/2020   Procedure: POLYPECTOMY;  Surgeon: Irving Copas., MD;  Location: [place];  Service: Gastroenterology;;   POLYPECTOMY  07/18/2022   Procedure: POLYPECTOMY;  Surgeon: Irving Copas., MD;  Location: [facility];  Service: Gastroenterology;;   SUBMUCOSAL LIFTING INJECTION  06/12/2020   Procedure: SUBMUCOSAL LIFTING INJECTION;  Surgeon: Irving Copas., MD;  Location: [facility];  Service: Gastroenterology;;   Social History   Socioeconomic History   Marital status: Married    Spouse name: Not on file   Number of children: 1   Years of education: Not on file   Highest education level: Not on file  Occupational History   Not on file  Tobacco Use   Smoking status: Never   Smokeless tobacco: Never  Vaping Use   Vaping Use: Never used  Substance and Sexual Activity   Alcohol use: Never   Drug use: No   Sexual activity: Not on file  Other Topics Concern   Not on file  Social History Narrative   Lives with husband Robin Peck. No children.    Social Determinants of Health   Financial Resource Strain: Low Risk  (03/22/2022)    Overall Financial Resource Strain (CARDIA)    Difficulty of Paying Living Expenses: Not hard at all  Food Insecurity: No Food Insecurity (03/22/2022)   Hunger Vital Sign    Worried About Running Out of Food in the Last Year: Never true    Ran Out of Food in the Last Year: Never true  Transportation Needs: No Transportation Needs (03/22/2022)   PRAPARE - Hydrologist (Medical): No    Lack of Transportation (Non-Medical): No  Physical Activity: Insufficiently Active (03/22/2022)   Exercise Vital Sign    Days of Exercise per Week: 3 days    Minutes of Exercise per Session: 30 min  Stress: No Stress Concern Present (03/22/2022)   [place]    Feeling of Stress : Not at all  Social Connections: [place] (03/22/2022)   Social Connection and Isolation Panel [NHANES]    Frequency of Communication with  Friends and Family: More than three times a week    Frequency of Social Gatherings with Friends and Family: More than three times a week    Attends Religious Services: More than 4 times per year    Active Member of [employer] or Organizations: Yes    Attends Music therapist: More than 4 times per year    Marital Status: Married  Human resources officer Violence: Not At Risk (03/22/2022)   Humiliation, Afraid, Rape, and Kick questionnaire    Fear of Current or Ex-Partner: No    Emotionally Abused: No    Physically Abused: No    Sexually Abused: No   Family History  Problem Relation Age of Onset   Hypertension Mother    Stroke Mother    Hypertension Father    Hypertension Sister    Heart attack Brother    Hypertension Brother    Heart attack Maternal Grandfather    Colon cancer Neg Hx    Stomach cancer Neg Hx    Esophageal cancer Neg Hx    Pancreatic cancer Neg Hx    Liver cancer Neg Hx    Inflammatory bowel disease Neg Hx    Rectal cancer Neg Hx    Colon polyps Neg Hx    I have  reviewed her medical, social, and family history in detail and updated the electronic medical record as necessary.    PHYSICAL EXAMINATION  Pulse 67   Ht 5' (1.524 m)   Wt 132 lb (59.9 kg)   BMI 25.78 kg/m  Wt Readings from Last 3 Encounters:  09/17/22 132 lb (59.9 kg)  07/18/22 125 lb (56.7 kg)  06/11/22 131 lb (59.4 kg)  GEN: NAD, appears stated age, doesn't appear chronically ill PSYCH: Cooperative, without pressured speech EYE: Conjunctivae pink, sclerae anicteric ENT: MMM CV: Nontachycardic RESP: No audible wheezing GI: Soft oft, NT/ND, without rebound or guarding MSK/EXT: No lower extremity edema SKIN: No jaundice NEURO:  Alert & Oriented x 3, no focal deficits   REVIEW OF DATA  I reviewed the following data at the time of this encounter:  GI Procedures and Studies  November 2023 colonoscopy Impression:          - Hemorrhoids found on digital rectal exam.                           - The examined portion of the ileum was normal.                           - Post mucosectomy scar in the cecum.                           - Two 3 to 4 mm polyps in the descending colon and                            at the hepatic flexure, removed with a cold snare.                            Resected and retrieved.                           - Normal mucosa in the entire examined colon  otherwise.                           - Non-bleeding non-thrombosed external and internal                            hemorrhoids.  Pathology FINAL MICROSCOPIC DIAGNOSIS:  A. COLON, HEPATIC FLEXURE, DESCENDING, POLYPECTOMY:  - Polypoid fragment of benign colonic mucosa with lymphoid aggregate    Laboratory Studies  Reviewed those in epic  Imaging Studies  No relevant studies to review   ASSESSMENT  Ms. Montecalvo is a 77 y.o. female with a pmh significant for hypertension, hyperlipidemia, anxiety, osteopenia, osteoarthritis/DJD, colon polyps (TA's and SSP).  The patient is  seen today for evaluation and management of:  1. Hx of adenomatous colonic polyps   2. Constipation, unspecified constipation type    The patient is clinically and hemodynamically stable.  She has done well since her colonoscopy where in which we performed a surveillance of her previous advanced adenoma.  Otherwise the patient is not experiencing any other new GI symptoms.  We went over some toileting techniques.  We discussed the need for increased fluid intake in an effort of trying to combat issues of constipation.  She will remain on a high-fiber diet.  As long as the patient's health remains an excellent standing, then we will plan a repeat colonoscopy in 2026  .All patient questions were answered to the best of my ability, and the patient agrees to the aforementioned plan of action with follow-up as indicated.   PLAN  Continue high-fiber diet Consider fiber supplementation Toileting techniques discussed Increase fluid intake to 64 to 80 ounces of fluid per day if possible Repeat colonoscopy in 2026 - Patient will be 77 years of age, we will have to see her in clinic to determine ability to continue colon polyp surveillance/colon cancer screening Follow-up on an as-needed basis otherwise    No orders of the defined types were placed in this encounter.   New Prescriptions   No medications on file   Modified Medications   No medications on file    Planned Follow Up Return if symptoms worsen or fail to improve.   Total Time in Face-to-Face and in Coordination of Care for patient including independent/personal interpretation/review of prior testing, medical history, examination, medication adjustment, communicating results with the patient directly, and documentation with the EHR is 15 minutes.   Justice Britain, MD [facility] Advanced Endoscopy Office # [phone]

## 2022-09-27 ENCOUNTER — Other Ambulatory Visit: Payer: Self-pay | Admitting: Family Medicine

## 2022-09-27 DIAGNOSIS — F064 Anxiety disorder due to known physiological condition: Secondary | ICD-10-CM

## 2022-09-27 NOTE — Telephone Encounter (Signed)
ERx 

## 2022-09-27 NOTE — Telephone Encounter (Signed)
Name of Medication: Alprazolam Name of Pharmacy: [facility] or Written Date and Quantity: 04/09/22, #30 Last Office Visit and Type: 04/09/22, CPE Next Office Visit and Type: 10/11/22, 6 mo f/u Last Controlled Substance Agreement Date: none Last UDS: none

## 2022-10-11 ENCOUNTER — Ambulatory Visit (INDEPENDENT_AMBULATORY_CARE_PROVIDER_SITE_OTHER): Payer: Medicare Other | Admitting: Family Medicine

## 2022-10-11 ENCOUNTER — Encounter: Payer: Self-pay | Admitting: Family Medicine

## 2022-10-11 VITALS — BP 150/80 | HR 70 | Temp 97.3°F | Ht 60.0 in | Wt 128.0 lb

## 2022-10-11 DIAGNOSIS — H9193 Unspecified hearing loss, bilateral: Secondary | ICD-10-CM | POA: Diagnosis not present

## 2022-10-11 DIAGNOSIS — R0989 Other specified symptoms and signs involving the circulatory and respiratory systems: Secondary | ICD-10-CM | POA: Insufficient documentation

## 2022-10-11 DIAGNOSIS — I10 Essential (primary) hypertension: Secondary | ICD-10-CM | POA: Diagnosis not present

## 2022-10-11 DIAGNOSIS — F5104 Psychophysiologic insomnia: Secondary | ICD-10-CM

## 2022-10-11 DIAGNOSIS — H9311 Tinnitus, right ear: Secondary | ICD-10-CM

## 2022-10-11 DIAGNOSIS — H9190 Unspecified hearing loss, unspecified ear: Secondary | ICD-10-CM | POA: Insufficient documentation

## 2022-10-11 DIAGNOSIS — E559 Vitamin D deficiency, unspecified: Secondary | ICD-10-CM | POA: Insufficient documentation

## 2022-10-11 MED ORDER — VITAMIN D3 1.25 MG (50000 UT) PO TABS: 1.0000 | ORAL_TABLET | ORAL | 1 refills | Status: DC

## 2022-10-11 NOTE — Assessment & Plan Note (Addendum)
Chronic, elevated on initial check but does come down with repeat testing - anticipate component of white coat hypertension. Continue current regimen including losartan 163m and amlodipine 529mdaily. She also takes propranolol 6059maily. Asked to bring BP home cuff to next visit to compare with ours.

## 2022-10-11 NOTE — Assessment & Plan Note (Signed)
Provided with sleep hygiene measures handout.  Melatonin not really effective.  Continues xanax sparingly.  She notes tinnitus worsens sleep initiation insomnia difficulty

## 2022-10-11 NOTE — Assessment & Plan Note (Signed)
Previous CXR 2022 - Chronic appearing increased interstitial lung markings are seen with mild areas of atelectasis and/or early infiltrate noted within the bilateral lung bases.  Pt asxs from respiratory standpoint - consider updated CXR next visit.

## 2022-10-11 NOTE — Assessment & Plan Note (Addendum)
Longstanding, R sided, associated with R ear hearing loss, now wears hearing aides.  Sounds like she's completed reassuring [facility] evaluation previously - will request records including MRI - normal per pt report.

## 2022-10-11 NOTE — Assessment & Plan Note (Addendum)
R>L with associated tinnitus, wears hearing aides.

## 2022-10-11 NOTE — Progress Notes (Addendum)
Patient ID: Robin Peck, female    DOB: [birth year], 77 y.o.   MRN: [id number]  This visit was conducted in person.  BP (!) 150/80 (BP Location: Right Arm, Cuff Size: Normal)   Pulse 70   Temp (!) 97.3 F (36.3 C) (Temporal)   Ht 5' (1.524 m)   Wt 128 lb (58.1 kg)   SpO2 97%   BMI 25.00 kg/m   BP Readings from Last 3 Encounters:  10/11/22 (!) 150/80  07/18/22 (!) 126/50  06/11/22 130/60  Improved on recheck  CC: 6 mo f/u visit  Subjective:   HPI: Robin Peck is a 77 y.o. female presenting on 10/11/2022 for Medical Management of Chronic Issues (Here for 6 mo f/u.)   HTN - Compliant with current antihypertensive regimen of amlodipine 70m daily, hctz 253mdaily, losartan 10069maily, propranolol LA 34m18mily (migraine prevention). Does check blood pressures at home: 130-140/70s. Likely does have component of white coat hypertension. No low blood pressure readings or symptoms of dizziness/syncope. Denies HA, vision changes, CP/tightness, SOB, leg swelling. She has automatic home BP cuff.   Vit D replacement helped resolve body ache.   Notes ongoing ringing in ears predominantly R - ongoing for years. Started after she took medicine to treat triglycerides 1990s. Medicine may have caused R ear ringing. This can affect her ability to fall asleep. Worse with airplane rides. Describes buzzing, hissing to right ear that started on [place]. No facial numbness, nausea, dizziness or vertigo. Ongoing hearing loss to right ear. She is using R ear hearing aide with benefit. Not whooshing or pulsatile sound. She saw [facility] last year s/p reassuring MRI.   Colonoscopy 07/2022 - benign polyp, hemorrhoids, rpt 3 yrs given large adenoma hx ([facility], [facility] NabD)      Relevant past medical, surgical, family and social history reviewed and updated as indicated. Interim medical history since our last visit reviewed. Allergies and medications  reviewed and updated. Outpatient Medications Prior to Visit  Medication Sig Dispense Refill   ALPRAZolam (XANAX) 0.25 MG tablet TAKE 1 TABLET BY MOUTH TWICE A DAY AS NEEDED FOR ANXIETY OR SLEEP 30 tablet 0   ascorbic acid (VITAMIN C) 500 MG tablet Take 500 mg by mouth daily in the afternoon.     atorvastatin (LIPITOR) 10 MG tablet TAKE 1 TABLET BY MOUTH DAILY (Patient taking differently: Take 10 mg by mouth at bedtime.) 90 tablet 3   cetirizine (ZYRTEC) 10 MG tablet Take 1 tablet (10 mg total) by mouth daily as needed (seasonal allergies.). (Patient taking differently: Take 10 mg by mouth in the morning.)     famotidine (PEPCID) 20 MG tablet Take 1 tablet (20 mg total) by mouth at bedtime.     losartan (COZAAR) 100 MG tablet Take 1 tablet (100 mg total) by mouth daily. (Patient taking differently: Take 100 mg by mouth at bedtime.) 90 tablet 3   NORVASC 5 MG tablet Take 1 tablet (5 mg total) by mouth daily. 90 tablet 3   OVER THE COUNTER MEDICATION Take 1 tablet by mouth daily in the afternoon. SUPER BEETS     potassium chloride SA (KLOR-CON M20) 20 MEQ tablet Take 1 tablet (20 mEq total) by mouth daily. 90 tablet 3   propranolol ER (INDERAL LA) 60 MG 24 hr capsule TAKE 1 CAPSULE BY MOUTH DAILY 90 capsule 3   TURMERIC PO Take 1 tablet by mouth daily in the afternoon. GUMMIES  Zinc 30 MG CAPS Take 1 capsule (30 mg total) by mouth daily. (Patient taking differently: Take 1 capsule by mouth daily in the afternoon.) 30 capsule    Cholecalciferol (VITAMIN D3) 1.25 MG (50000 UT) TABS Take 1 tablet by mouth once a week. (Patient taking differently: Take 1 tablet by mouth every Thursday.) 12 tablet 1   No facility-administered medications prior to visit.     Per HPI unless specifically indicated in ROS section below Review of Systems  Objective:  BP (!) 150/80 (BP Location: Right Arm, Cuff Size: Normal)   Pulse 70   Temp (!) 97.3 F (36.3 C) (Temporal)   Ht 5' (1.524 m)   Wt 128 lb (58.1 kg)    SpO2 97%   BMI 25.00 kg/m   Wt Readings from Last 3 Encounters:  10/11/22 128 lb (58.1 kg)  09/17/22 132 lb (59.9 kg)  07/18/22 125 lb (56.7 kg)      Physical Exam Vitals and nursing note reviewed.  Constitutional:      Appearance: Normal appearance. She is not ill-appearing.  HENT:     Head: Normocephalic and atraumatic.     Right Ear: Tympanic membrane, ear canal and external ear normal. There is no impacted cerumen.     Left Ear: Tympanic membrane, ear canal and external ear normal. There is no impacted cerumen.     Ears:     Comments: Wears hearing aides which were removed    Mouth/Throat:     Comments: Wearing mask Eyes:     Extraocular Movements: Extraocular movements intact.     Pupils: Pupils are equal, round, and reactive to light.  Cardiovascular:     Rate and Rhythm: Normal rate and regular rhythm.     Pulses: Normal pulses.     Heart sounds: Normal heart sounds. No murmur heard. Pulmonary:     Effort: Pulmonary effort is normal. No respiratory distress.     Breath sounds: No wheezing, rhonchi or rales.     Comments: LLL crackles that partially clear with deep cough Neurological:     Mental Status: She is alert.  Psychiatric:        Mood and Affect: Mood normal.        Behavior: Behavior normal.       Results for orders placed or performed during the hospital encounter of 07/18/22  Surgical pathology  Result Value Ref Range   SURGICAL PATHOLOGY      SURGICAL PATHOLOGY CASE: [id number] PATIENT: Robin Peck Surgical Pathology Report     Clinical History: Hx of colon polyps, abnormal colonoscopy (crm)     FINAL MICROSCOPIC DIAGNOSIS:  A. COLON, HEPATIC FLEXURE, DESCENDING, POLYPECTOMY: - Polypoid fragment of benign colonic mucosa with lymphoid aggregate   GROSS DESCRIPTION:  Received in formalin are tan, soft tissue fragments that are submitted in toto. Number: 2 size: 0.7 and 0.8 cm blocks: 1 (GRP 07/18/2022)   Final Diagnosis  performed by Tilford Pillar, MD.   Electronically signed 07/19/2022 Technical and / or Professional components performed at [facility], [place] [address], [place], [place] [address].  Immunohistochemistry Technical component (if applicable) was performed at [facility]. [address] [place], [place], [place] [address].   IMMUNOHISTOCHEMISTRY DISCLAIMER (if applicable): Some of these immunohistochemical stains may have been developed and  the performance characteristics determine by [facility]. Some may not have been cleared or approved by the U.S. Food and Drug Administration. The FDA has determined that such clearance or  approval is not necessary. This test is used for clinical purposes. It should not be regarded as investigational or for research. This laboratory is certified under the [place] (CLIA-88) as qualified to perform high complexity clinical laboratory testing.  The controls stained appropriately.     Assessment & Plan:   Problem List Items Addressed This Visit     Essential hypertension - Primary    Chronic, elevated on initial check but does come down with repeat testing - anticipate component of white coat hypertension. Continue current regimen including losartan 132m and amlodipine 551mdaily. She also takes propranolol 6044maily. Asked to bring BP home cuff to next visit to compare with ours.       Tinnitus aurium, right    Longstanding, R sided, associated with R ear hearing loss, now wears hearing aides.  Sounds like she's completed reassuring [facility] evaluation previously - will request records including MRI - normal per pt report.       Chronic insomnia    Provided with sleep hygiene measures handout.  Melatonin not really effective.  Continues xanax sparingly.  She notes tinnitus worsens sleep initiation insomnia difficulty      Decreased hearing    R>L  with associated tinnitus, wears hearing aides.       Vitamin D deficiency    Weekly replacement has resolved body aches she was having - continue vit D 50k units weekly, reassess control at CPE      Abnormal lung sounds    Previous CXR 2022 - Chronic appearing increased interstitial lung markings are seen with mild areas of atelectasis and/or early infiltrate noted within the bilateral lung bases.  Pt asxs from respiratory standpoint - consider updated CXR next visit.         Meds ordered this encounter  Medications   Cholecalciferol (VITAMIN D3) 1.25 MG (50000 UT) TABS    Sig: Take 1 tablet by mouth once a week.    Dispense:  12 tablet    Refill:  1    No orders of the defined types were placed in this encounter.   Patient Instructions  Bring home BP cuff to next appointment to compare to ours.  [facility]   Bedtime routine checklist: 1. Avoid naps during the day 2. Avoid stimulants such as caffeine and nicotine. Avoid bedtime alcohol (it can speed onset of sleep but the body's metabolism can cause awakenings). 3. All forms of exercise help ensure sound sleep - limit vigorous exercise to morning or late afternoon 4. Avoid food too close to bedtime including chocolate (which contains caffeine) 5. Soak up natural light 6. Establish regular bedtime routine. 7. Associate bed with sleep - avoid TV, computer or phone, reading while in bed. 8. Ensure pleasant, relaxing sleep environment - quiet, dark, cool room.   Ok to use melatonin as needed, xanax sparingly.  Return in 6 months for physical with labwork.   Follow up plan: Return if symptoms worsen or fail to improve.  Robin BushD

## 2022-10-11 NOTE — Assessment & Plan Note (Signed)
Weekly replacement has resolved body aches she was having - continue vit D 50k units weekly, reassess control at CPE

## 2022-10-11 NOTE — Patient Instructions (Addendum)
Bring home BP cuff to next appointment to compare to ours.  We will request latest office notes and MRI from [facility].   Bedtime routine checklist: 1. Avoid naps during the day 2. Avoid stimulants such as caffeine and nicotine. Avoid bedtime alcohol (it can speed onset of sleep but the body's metabolism can cause awakenings). 3. All forms of exercise help ensure sound sleep - limit vigorous exercise to morning or late afternoon 4. Avoid food too close to bedtime including chocolate (which contains caffeine) 5. Soak up natural light 6. Establish regular bedtime routine. 7. Associate bed with sleep - avoid TV, computer or phone, reading while in bed. 8. Ensure pleasant, relaxing sleep environment - quiet, dark, cool room.   Ok to use melatonin as needed, xanax sparingly.  Return in 6 months for physical with labwork.

## 2023-02-11 ENCOUNTER — Ambulatory Visit: Payer: Medicare Other | Admitting: Family Medicine

## 2023-03-26 ENCOUNTER — Ambulatory Visit (INDEPENDENT_AMBULATORY_CARE_PROVIDER_SITE_OTHER): Payer: Medicare Other

## 2023-03-26 VITALS — BP 131/72 | HR 62 | Ht 61.0 in | Wt 126.0 lb

## 2023-03-26 DIAGNOSIS — Z0000 Encounter for general adult medical examination without abnormal findings: Secondary | ICD-10-CM | POA: Diagnosis not present

## 2023-03-26 NOTE — Patient Instructions (Addendum)
Robin Peck , Thank you for taking time to come for your Medicare Wellness Visit. I appreciate your ongoing commitment to your health goals. Please review the following plan we discussed and let me know if I can assist you in the future.   These are the goals we discussed:  Goals       Increase physical activity      Walk on treadmill 30 minutes/ 3x per week.      No current goals (pt-stated)      Patient Stated      03/15/2021, I will maintain and continue medications as prescribed.       Patient Stated      Stay healthy and live longer.        This is a list of the screening recommended for you and due dates:  Health Maintenance  Topic Date Due   COVID-19 Vaccine (1 - 2023-24 season) Never done   Medicare Annual Wellness Visit  03/23/2023   Flu Shot  04/03/2023   Mammogram  06/01/2023   DTaP/Tdap/Td vaccine (2 - Tdap) 01/22/2026   Pneumonia Vaccine  Completed   DEXA scan (bone density measurement)  Completed   Hepatitis C Screening  Completed   Zoster (Shingles) Vaccine  Completed   HPV Vaccine  Aged Out   Colon Cancer Screening  Discontinued   Cologuard (Stool DNA test)  Discontinued    Advanced directives: Please bring a copy of your health care power of attorney and living will to the office to be added to your chart at your convenience.   Conditions/risks identified: Aim for 30 minutes of exercise or brisk walking, 6-8 glasses of water, and 5 servings of fruits and vegetables each day.   Next appointment: Follow up in one year for your annual wellness visit 03/30/23 @ 1:30 televisit   Preventive Care 65 Years and Older, Female Preventive care refers to lifestyle choices and visits with your health care provider that can promote health and wellness. What does preventive care include? A yearly physical exam. This is also called an annual well check. Dental exams once or twice a year. Routine eye exams. Ask your health care provider how often you should have your eyes  checked. Personal lifestyle choices, including: Daily care of your teeth and gums. Regular physical activity. Eating a healthy diet. Avoiding tobacco and drug use. Limiting alcohol use. Practicing safe sex. Taking low-dose aspirin every day. Taking vitamin and mineral supplements as recommended by your health care provider. What happens during an annual well check? The services and screenings done by your health care provider during your annual well check will depend on your age, overall health, lifestyle risk factors, and family history of disease. Counseling  Your health care provider may ask you questions about your: Alcohol use. Tobacco use. Drug use. Emotional well-being. Home and relationship well-being. Sexual activity. Eating habits. History of falls. Memory and ability to understand (cognition). Work and work Astronomer. Reproductive health. Screening  You may have the following tests or measurements: Height, weight, and BMI. Blood pressure. Lipid and cholesterol levels. These may be checked every 5 years, or more frequently if you are over 72 years old. Skin check. Lung cancer screening. You may have this screening every year starting at age 58 if you have a 30-pack-year history of smoking and currently smoke or have quit within the past 15 years. Fecal occult blood test (FOBT) of the stool. You may have this test every year starting at age 50. Flexible  sigmoidoscopy or colonoscopy. You may have a sigmoidoscopy every 5 years or a colonoscopy every 10 years starting at age 56. Hepatitis C blood test. Hepatitis B blood test. Sexually transmitted disease (STD) testing. Diabetes screening. This is done by checking your blood sugar (glucose) after you have not eaten for a while (fasting). You may have this done every 1-3 years. Bone density scan. This is done to screen for osteoporosis. You may have this done starting at age 51. Mammogram. This may be done every 1-2  years. Talk to your health care provider about how often you should have regular mammograms. Talk with your health care provider about your test results, treatment options, and if necessary, the need for more tests. Vaccines  Your health care provider may recommend certain vaccines, such as: Influenza vaccine. This is recommended every year. Tetanus, diphtheria, and acellular pertussis (Tdap, Td) vaccine. You may need a Td booster every 10 years. Zoster vaccine. You may need this after age 81. Pneumococcal 13-valent conjugate (PCV13) vaccine. One dose is recommended after age 70. Pneumococcal polysaccharide (PPSV23) vaccine. One dose is recommended after age 78. Talk to your health care provider about which screenings and vaccines you need and how often you need them. This information is not intended to replace advice given to you by your health care provider. Make sure you discuss any questions you have with your health care provider. Document Released: 09/15/2015 Document Revised: 05/08/2016 Document Reviewed: 06/20/2015 Elsevier Interactive Patient Education  2017 [employer].  Fall Prevention in the Home Falls can cause injuries. They can happen to people of all ages. There are many things you can do to make your home safe and to help prevent falls. What can I do on the outside of my home? Regularly fix the edges of walkways and driveways and fix any cracks. Remove anything that might make you trip as you walk through a door, such as a raised step or threshold. Trim any bushes or trees on the path to your home. Use bright outdoor lighting. Clear any walking paths of anything that might make someone trip, such as rocks or tools. Regularly check to see if handrails are loose or broken. Make sure that both sides of any steps have handrails. Any raised decks and porches should have guardrails on the edges. Have any leaves, snow, or ice cleared regularly. Use sand or salt on walking paths  during winter. Clean up any spills in your garage right away. This includes oil or grease spills. What can I do in the bathroom? Use night lights. Install grab bars by the toilet and in the tub and shower. Do not use towel bars as grab bars. Use non-skid mats or decals in the tub or shower. If you need to sit down in the shower, use a plastic, non-slip stool. Keep the floor dry. Clean up any water that spills on the floor as soon as it happens. Remove soap buildup in the tub or shower regularly. Attach bath mats securely with double-sided non-slip rug tape. Do not have throw rugs and other things on the floor that can make you trip. What can I do in the bedroom? Use night lights. Make sure that you have a light by your bed that is easy to reach. Do not use any sheets or blankets that are too big for your bed. They should not hang down onto the floor. Have a firm chair that has side arms. You can use this for support while you get dressed.  Do not have throw rugs and other things on the floor that can make you trip. What can I do in the kitchen? Clean up any spills right away. Avoid walking on wet floors. Keep items that you use a lot in easy-to-reach places. If you need to reach something above you, use a strong step stool that has a grab bar. Keep electrical cords out of the way. Do not use floor polish or wax that makes floors slippery. If you must use wax, use non-skid floor wax. Do not have throw rugs and other things on the floor that can make you trip. What can I do with my stairs? Do not leave any items on the stairs. Make sure that there are handrails on both sides of the stairs and use them. Fix handrails that are broken or loose. Make sure that handrails are as long as the stairways. Check any carpeting to make sure that it is firmly attached to the stairs. Fix any carpet that is loose or worn. Avoid having throw rugs at the top or bottom of the stairs. If you do have throw  rugs, attach them to the floor with carpet tape. Make sure that you have a light switch at the top of the stairs and the bottom of the stairs. If you do not have them, ask someone to add them for you. What else can I do to help prevent falls? Wear shoes that: Do not have high heels. Have rubber bottoms. Are comfortable and fit you well. Are closed at the toe. Do not wear sandals. If you use a stepladder: Make sure that it is fully opened. Do not climb a closed stepladder. Make sure that both sides of the stepladder are locked into place. Ask someone to hold it for you, if possible. Clearly mark and make sure that you can see: Any grab bars or handrails. First and last steps. Where the edge of each step is. Use tools that help you move around (mobility aids) if they are needed. These include: Canes. Walkers. Scooters. Crutches. Turn on the lights when you go into a dark area. Replace any light bulbs as soon as they burn out. Set up your furniture so you have a clear path. Avoid moving your furniture around. If any of your floors are uneven, fix them. If there are any pets around you, be aware of where they are. Review your medicines with your doctor. Some medicines can make you feel dizzy. This can increase your chance of falling. Ask your doctor what other things that you can do to help prevent falls. This information is not intended to replace advice given to you by your health care provider. Make sure you discuss any questions you have with your health care provider. Document Released: 06/15/2009 Document Revised: 01/25/2016 Document Reviewed: 09/23/2014 Elsevier Interactive Patient Education  2017 [employer].

## 2023-03-26 NOTE — Progress Notes (Addendum)
Subjective:   Robin Peck is a 77 y.o. female who presents for Medicare Annual (Subsequent) preventive examination.  Visit Complete: Virtual  I connected with  Robin Peck on 03/26/23 by a audio enabled telemedicine application and verified that I am speaking with the correct person using two identifiers.  Patient Location: Home  Provider Location: Home Office  I discussed the limitations of evaluation and management by telemedicine. The patient expressed understanding and agreed to proceed.   Review of Systems      Cardiac Risk Factors include: advanced age (>41men, >76 women);hypertension;dyslipidemia;sedentary lifestyle   Pt reported vitals.    Objective:    Today's Vitals   03/26/23 0948 03/26/23 0949  BP: 131/72   Pulse: 62   SpO2: 96%   Weight: 126 lb (57.2 kg)   Height: 5\' 1"  (1.549 m)   PainSc:  6    Body mass index is 23.81 kg/m.     03/26/2023    9:59 AM 07/18/2022    8:47 AM 03/22/2022    3:09 PM 03/15/2021    8:31 AM 06/12/2020   10:02 AM 09/21/2019   11:49 PM 07/29/2018    9:23 AM  Advanced Directives  Does Patient Have a Medical Advance Directive? Yes Yes Yes Yes No;Yes No Yes  Type of Estate agent of [place];Living will Healthcare Power of [place];Living will Healthcare Power of [place];Living will Healthcare Power of [place];Living will   Healthcare Power of [place];Living will  Does patient want to make changes to medical advance directive?   No - Patient declined      Copy of Healthcare Power of Attorney in Chart? No - copy requested No - copy requested No - copy requested No - copy requested   No - copy requested  Would patient like information on creating a medical advance directive?      No - Patient declined     Current Medications (verified) Outpatient Encounter Medications as of 03/26/2023  Medication Sig   ALPRAZolam (XANAX) 0.25 MG tablet TAKE 1 TABLET BY MOUTH TWICE A DAY AS NEEDED FOR ANXIETY OR SLEEP    ascorbic acid (VITAMIN C) 500 MG tablet Take 500 mg by mouth daily in the afternoon.   atorvastatin (LIPITOR) 10 MG tablet TAKE 1 TABLET BY MOUTH DAILY (Patient taking differently: Take 10 mg by mouth at bedtime.)   cetirizine (ZYRTEC) 10 MG tablet Take 1 tablet (10 mg total) by mouth daily as needed (seasonal allergies.). (Patient taking differently: Take 10 mg by mouth in the morning.)   Cholecalciferol (VITAMIN D3) 1.25 MG (50000 UT) TABS Take 1 tablet by mouth once a week.   famotidine (PEPCID) 20 MG tablet Take 1 tablet (20 mg total) by mouth at bedtime.   losartan (COZAAR) 100 MG tablet Take 1 tablet (100 mg total) by mouth daily. (Patient taking differently: Take 100 mg by mouth at bedtime.)   NORVASC 5 MG tablet Take 1 tablet (5 mg total) by mouth daily.   OVER THE COUNTER MEDICATION Take 1 tablet by mouth daily in the afternoon. SUPER BEETS   potassium chloride SA (KLOR-CON M20) 20 MEQ tablet Take 1 tablet (20 mEq total) by mouth daily.   propranolol ER (INDERAL LA) 60 MG 24 hr capsule TAKE 1 CAPSULE BY MOUTH DAILY   TURMERIC PO Take 1 tablet by mouth daily in the afternoon. GUMMIES   Zinc 30 MG CAPS Take 1 capsule (30 mg total) by mouth daily. (Patient taking differently: Take 1 capsule  by mouth daily in the afternoon.)   [DISCONTINUED] hydrochlorothiazide (HYDRODIURIL) 25 MG tablet TAKE ONE TABLET BY MOUTH DAILY   [DISCONTINUED] potassium chloride 20 MEQ TBCR Take 10 mEq by mouth 2 (two) times daily.   [DISCONTINUED] pregabalin (LYRICA) 50 MG capsule Take 1 capsule (50 mg total) by mouth daily.   No facility-administered encounter medications on file as of 03/26/2023.    Allergies (verified) Valacyclovir hcl, Gabapentin, Hydrochlorothiazide, and Lisinopril   History: Past Medical History:  Diagnosis Date   Allergic rhinitis    Allergy    seasonal   Anxiety    Arthritis    COVID-19 virus infection 02/02/2021   Heart murmur    Hyperlipidemia    Hypertension    Past  Surgical History:  Procedure Laterality Date   ABDOMINAL HYSTERECTOMY     CATARACT EXTRACTION, BILATERAL     COLONOSCOPY  07/2022   benign polyp, rpt 3 yrs ([facility])   COLONOSCOPY WITH PROPOFOL N/A 06/12/2020   3.5cm lesion removed piecemeal, smaller polyp removed, int hem ([facility])   COLONOSCOPY WITH PROPOFOL N/A 07/18/2022   polyp - benign, hemorrhoids, rpt 3 yrs given large adenoma hx ([facility], Netty Starring., MD)   ENDOSCOPIC MUCOSAL RESECTION N/A 06/12/2020   Procedure: ENDOSCOPIC MUCOSAL RESECTION;  Surgeon: Lemar Lofty., MD;  Location: [facility] ENDOSCOPY;  Service: Gastroenterology;  Laterality: N/A;   HEMOSTASIS CLIP PLACEMENT  06/12/2020   Procedure: HEMOSTASIS CLIP PLACEMENT;  Surgeon: Lemar Lofty., MD;  Location: [facility] ENDOSCOPY;  Service: Gastroenterology;;   PARTIAL HYSTERECTOMY  2002   hysterectomy   POLYPECTOMY  06/12/2020   Procedure: POLYPECTOMY;  Surgeon: Meridee Score Netty Starring., MD;  Location: [facility] ENDOSCOPY;  Service: Gastroenterology;;   POLYPECTOMY  07/18/2022   Procedure: POLYPECTOMY;  Surgeon: Lemar Lofty., MD;  Location: [facility] ENDOSCOPY;  Service: Gastroenterology;;   SUBMUCOSAL LIFTING INJECTION  06/12/2020   Procedure: SUBMUCOSAL LIFTING INJECTION;  Surgeon: Lemar Lofty., MD;  Location: [facility] ENDOSCOPY;  Service: Gastroenterology;;   Family History  Problem Relation Age of Onset   Hypertension Mother    Stroke Mother    Hypertension Father    Hypertension Sister    Heart attack Brother    Hypertension Brother    Heart attack Maternal Grandfather    Colon cancer Neg Hx    Stomach cancer Neg Hx    Esophageal cancer Neg Hx    Pancreatic cancer Neg Hx    Liver cancer Neg Hx    Inflammatory bowel disease Neg Hx    Rectal cancer Neg Hx    Colon polyps Neg Hx    Social History   Socioeconomic History   Marital status: Married    Spouse name: Not on file   Number of children: 1   Years of education: Not on file    Highest education level: Not on file  Occupational History   Not on file  Tobacco Use   Smoking status: Never   Smokeless tobacco: Never  Vaping Use   Vaping status: Never Used  Substance and Sexual Activity   Alcohol use: Never   Drug use: No   Sexual activity: Not on file  Other Topics Concern   Not on file  Social History Narrative   Lives with husband Dorene Sorrow. No children.    Social Determinants of Health   Financial Resource Strain: Low Risk  (03/26/2023)   Overall Financial Resource Strain (CARDIA)    Difficulty of Paying Living Expenses: Not hard at all  Food Insecurity: No Food Insecurity (  03/26/2023)   Hunger Vital Sign    Worried About Running Out of Food in the Last Year: Never true    Ran Out of Food in the Last Year: Never true  Transportation Needs: No Transportation Needs (03/26/2023)   PRAPARE - Administrator, Civil Service (Medical): No    Lack of Transportation (Non-Medical): No  Physical Activity: Inactive (03/26/2023)   Exercise Vital Sign    Days of Exercise per Week: 0 days    Minutes of Exercise per Session: 0 min  Stress: No Stress Concern Present (03/26/2023)   [employer] of Occupational Health - Occupational Stress Questionnaire    Feeling of Stress : Not at all  Social Connections: Moderately Integrated (03/26/2023)   Social Connection and Isolation Panel [NHANES]    Frequency of Communication with Friends and Family: More than three times a week    Frequency of Social Gatherings with Friends and Family: More than three times a week    Attends Religious Services: More than 4 times per year    Active Member of [employer] or Organizations: No    Attends Engineer, structural: Never    Marital Status: Married    Tobacco Counseling Counseling given: Not Answered   Clinical Intake:  Pre-visit preparation completed: Yes  Pain : 0-10 Pain Score: 6  Pain Type: Acute pain Pain Location: Knee Pain Orientation: Right Pain  Descriptors / Indicators: Tightness Pain Onset: 1 to 4 weeks ago     BMI - recorded: 23.81 Nutritional Status: BMI of 19-24  Normal Nutritional Risks: None Diabetes: No  How often do you need to have someone help you when you read instructions, pamphlets, or other written materials from your doctor or pharmacy?: 1 - Never  Interpreter Needed?: No  Information entered by :: C.Quang Thorpe LPN   Activities of Daily Living    03/26/2023   10:02 AM  In your present state of health, do you have any difficulty performing the following activities:  Hearing? 1  Comment wears aids  Vision? 0  Difficulty concentrating or making decisions? 0  Walking or climbing stairs? 1  Comment Knee pain  Dressing or bathing? 0  Doing errands, shopping? 0  Preparing Food and eating ? N  Using the Toilet? N  In the past six months, have you accidently leaked urine? Y  Comment occasionally when coughs  Do you have problems with loss of bowel control? N  Managing your Medications? N  Managing your Finances? N  Housekeeping or managing your Housekeeping? N    Patient Care Team: Eustaquio Boyden, MD as PCP - General (Family Medicine) Kathyrn Sheriff, [facility] (Inactive) as Pharmacist (Pharmacist)  Indicate any recent Medical Services you may have received from other than [facility] providers in the past year (date may be approximate).     Assessment:   This is a routine wellness examination for Liya.  Hearing/Vision screen Hearing Screening - Comments:: Wears aids Vision Screening - Comments:: Readers -[facility]- UTD on eye exams  Dietary issues and exercise activities discussed:     Goals Addressed             This Visit's Progress    Patient Stated       Stay healthy and live longer.       Depression Screen    03/26/2023    9:57 AM 10/11/2022    9:27 AM 03/22/2022    3:05 PM 03/15/2021    8:37 AM 12/20/2019  9:50 AM 10/13/2019   12:20 PM 07/29/2018    9:25 AM  PHQ 2/9  Scores  PHQ - 2 Score 0 0 0 0 0 0 0  PHQ- 9 Score  2  0 0 4     Fall Risk    03/26/2023   10:02 AM 10/11/2022    9:27 AM 03/22/2022    3:09 PM 03/15/2021    8:35 AM 12/20/2019    9:50 AM  Fall Risk   Falls in the past year? 0 0 0 1 0  Number falls in past yr: 0  0 0   Injury with Fall? 0  0 0   Risk for fall due to : No Fall Risks  No Fall Risks Medication side effect   Follow up Falls prevention discussed;Falls evaluation completed   Falls evaluation completed;Falls prevention discussed     MEDICARE RISK AT HOME:  Medicare Risk at Home - 03/26/23 1003     Any stairs in or around the home? Yes    If so, are there any without handrails? No    Home free of loose throw rugs in walkways, pet beds, electrical cords, etc? Yes    Adequate lighting in your home to reduce risk of falls? Yes    Life alert? No    Use of a cane, walker or w/c? No    Grab bars in the bathroom? Yes    Shower chair or bench in shower? Yes    Elevated toilet seat or a handicapped toilet? Yes             TIMED UP AND GO:  Was the test performed?  No    Cognitive Function:    03/15/2021    8:45 AM  MMSE - Mini Mental State Exam  Orientation to time 5  Orientation to Place 5  Registration 3  Attention/ Calculation 5  Recall 3  Language- repeat 1        03/26/2023   10:04 AM 03/22/2022    3:10 PM  6CIT Screen  What Year? 0 points 0 points  What month? 0 points 0 points  What time? 0 points 0 points  Count back from 20 0 points 0 points  Months in reverse 0 points 0 points  Repeat phrase 0 points 2 points  Total Score 0 points 2 points    Immunizations Immunization History  Administered Date(s) Administered   Fluad Quad(high Dose 65+) 07/13/2022   Influenza, High Dose Seasonal PF 08/30/2020, 07/18/2021   Pneumococcal Conjugate-13 02/14/2016   Pneumococcal Polysaccharide-23 04/09/2022   Td 01/23/2016   Zoster Recombinant(Shingrix) 10/13/2019, 01/15/2020    TDAP status: Up to  date  Flu Vaccine status: Up to date  Pneumococcal vaccine status: Up to date  Covid-19 vaccine status: Declined, Education has been provided regarding the importance of this vaccine but patient still declined. Advised may receive this vaccine at local pharmacy or Health Dept.or vaccine clinic. Aware to provide a copy of the vaccination record if obtained from local pharmacy or Health Dept. Verbalized acceptance and understanding.  Qualifies for Shingles Vaccine? Yes   Zostavax completed No   Shingrix Completed?: Yes  Screening Tests Health Maintenance  Topic Date Due   COVID-19 Vaccine (1 - 2023-24 season) Never done   Medicare Annual Wellness (AWV)  03/23/2023   INFLUENZA VACCINE  04/03/2023   MAMMOGRAM  06/01/2023   DTaP/Tdap/Td (2 - Tdap) 01/22/2026   Pneumonia Vaccine 66+ Years old  Completed   DEXA SCAN  Completed   Hepatitis C Screening  Completed   Zoster Vaccines- Shingrix  Completed   HPV VACCINES  Aged Out   Colonoscopy  Discontinued   Fecal DNA (Cologuard)  Discontinued    Health Maintenance  Health Maintenance Due  Topic Date Due   COVID-19 Vaccine (1 - 2023-24 season) Never done   Medicare Annual Wellness (AWV)  03/23/2023    Colorectal cancer screening: No longer required.   Mammogram status: No longer required due to age. Pt declined will call back if changes mind.  Bone Scan - Patient declined.  Lung Cancer Screening: (Low Dose CT Chest recommended if Age 59-80 years, 20 pack-year currently smoking OR have quit w/in 15years.) does not qualify.   Lung Cancer Screening Referral: no  Additional Screening:  Hepatitis C Screening: does not qualify; Completed no  Vision Screening: Recommended annual ophthalmology exams for early detection of glaucoma and other disorders of the eye. Is the patient up to date with their annual eye exam?  Yes  Who is the provider or what is the name of the office in which the patient attends annual eye exams? [facility] If pt is not established with a provider, would they like to be referred to a provider to establish care? Yes .   Dental Screening: Recommended annual dental exams for proper oral hygiene    Community Resource Referral / Chronic Care Management: CRR required this visit?  No   CCM required this visit?  No     Plan:     I have personally reviewed and noted the following in the patient's chart:   Medical and social history Use of alcohol, tobacco or illicit drugs  Current medications and supplements including opioid prescriptions. Patient is not currently taking opioid prescriptions. Functional ability and status Nutritional status Physical activity Advanced directives List of other physicians Hospitalizations, surgeries, and ER visits in previous 12 months Vitals Screenings to include cognitive, depression, and falls Referrals and appointments  In addition, I have reviewed and discussed with patient certain preventive protocols, quality metrics, and best practice recommendations. A written personalized care plan for preventive services as well as general preventive health recommendations were provided to patient.     Maryan Puls, LPN   [id number]   After Visit Summary: (MyChart) Due to this being a telephonic visit, the after visit summary with patients personalized plan was offered to patient via MyChart   Nurse Notes: Pt felt pop in her knee approximately 2 weeks ago and has been experiencing "tightness". Pt agreed to call office to schedule appointment for evaluation.

## 2023-04-01 DIAGNOSIS — M25561 Pain in right knee: Secondary | ICD-10-CM | POA: Diagnosis not present

## 2023-04-02 ENCOUNTER — Other Ambulatory Visit: Payer: Self-pay | Admitting: Family Medicine

## 2023-04-02 NOTE — Telephone Encounter (Signed)
K[facility]-con Last filled:  01/03/23, #90 Last OV:  10/11/22, 6 mo f/u Next OV:  04/14/23, CPE

## 2023-04-03 DIAGNOSIS — M7121 Synovial cyst of popliteal space [Baker], right knee: Secondary | ICD-10-CM | POA: Diagnosis not present

## 2023-04-03 DIAGNOSIS — R609 Edema, unspecified: Secondary | ICD-10-CM | POA: Diagnosis not present

## 2023-04-03 DIAGNOSIS — M25561 Pain in right knee: Secondary | ICD-10-CM | POA: Diagnosis not present

## 2023-04-03 DIAGNOSIS — M25461 Effusion, right knee: Secondary | ICD-10-CM | POA: Diagnosis not present

## 2023-04-03 DIAGNOSIS — S83241A Other tear of medial meniscus, current injury, right knee, initial encounter: Secondary | ICD-10-CM | POA: Diagnosis not present

## 2023-04-05 ENCOUNTER — Other Ambulatory Visit: Payer: Self-pay | Admitting: Family Medicine

## 2023-04-05 DIAGNOSIS — E7800 Pure hypercholesterolemia, unspecified: Secondary | ICD-10-CM

## 2023-04-05 DIAGNOSIS — M810 Age-related osteoporosis without current pathological fracture: Secondary | ICD-10-CM

## 2023-04-05 DIAGNOSIS — E876 Hypokalemia: Secondary | ICD-10-CM

## 2023-04-05 DIAGNOSIS — E559 Vitamin D deficiency, unspecified: Secondary | ICD-10-CM

## 2023-04-05 DIAGNOSIS — R7989 Other specified abnormal findings of blood chemistry: Secondary | ICD-10-CM

## 2023-04-07 ENCOUNTER — Other Ambulatory Visit (INDEPENDENT_AMBULATORY_CARE_PROVIDER_SITE_OTHER): Payer: Medicare Other

## 2023-04-07 DIAGNOSIS — R7989 Other specified abnormal findings of blood chemistry: Secondary | ICD-10-CM | POA: Diagnosis not present

## 2023-04-07 DIAGNOSIS — M810 Age-related osteoporosis without current pathological fracture: Secondary | ICD-10-CM | POA: Diagnosis not present

## 2023-04-07 DIAGNOSIS — E876 Hypokalemia: Secondary | ICD-10-CM

## 2023-04-07 DIAGNOSIS — E7800 Pure hypercholesterolemia, unspecified: Secondary | ICD-10-CM | POA: Diagnosis not present

## 2023-04-07 DIAGNOSIS — E559 Vitamin D deficiency, unspecified: Secondary | ICD-10-CM | POA: Diagnosis not present

## 2023-04-07 LAB — CBC WITH DIFFERENTIAL/PLATELET
Basophils Absolute: 0 10*3/uL (ref 0.0–0.1)
Basophils Relative: 0.3 % (ref 0.0–3.0)
Eosinophils Absolute: 0.3 10*3/uL (ref 0.0–0.7)
Eosinophils Relative: 4.7 % (ref 0.0–5.0)
HCT: 37.2 % (ref 36.0–46.0)
Hemoglobin: 12.4 g/dL (ref 12.0–15.0)
Lymphocytes Relative: 46.4 % — ABNORMAL HIGH (ref 12.0–46.0)
Lymphs Abs: 2.9 10*3/uL (ref 0.7–4.0)
MCHC: 33.3 g/dL (ref 30.0–36.0)
MCV: 86.9 fl (ref 78.0–100.0)
Monocytes Absolute: 0.5 10*3/uL (ref 0.1–1.0)
Monocytes Relative: 8.7 % (ref 3.0–12.0)
Neutro Abs: 2.5 10*3/uL (ref 1.4–7.7)
Neutrophils Relative %: 39.9 % — ABNORMAL LOW (ref 43.0–77.0)
Platelets: 316 10*3/uL (ref 150.0–400.0)
RBC: 4.28 Mil/uL (ref 3.87–5.11)
RDW: 13 % (ref 11.5–15.5)
WBC: 6.3 10*3/uL (ref 4.0–10.5)

## 2023-04-07 LAB — VITAMIN D 25 HYDROXY (VIT D DEFICIENCY, FRACTURES): VITD: 56.39 ng/mL (ref 30.00–100.00)

## 2023-04-07 LAB — COMPREHENSIVE METABOLIC PANEL
ALT: 11 U/L (ref 0–35)
AST: 15 U/L (ref 0–37)
Albumin: 4 g/dL (ref 3.5–5.2)
Alkaline Phosphatase: 105 U/L (ref 39–117)
BUN: 22 mg/dL (ref 6–23)
CO2: 26 mEq/L (ref 19–32)
Calcium: 10 mg/dL (ref 8.4–10.5)
Chloride: 106 mEq/L (ref 96–112)
Creatinine, Ser: 0.85 mg/dL (ref 0.40–1.20)
GFR: 66.2 mL/min (ref >60.00)
Glucose, Bld: 93 mg/dL (ref 70–99)
Potassium: 4.6 mEq/L (ref 3.5–5.1)
Sodium: 140 mEq/L (ref 135–145)
Total Bilirubin: 0.6 mg/dL (ref 0.2–1.2)
Total Protein: 7.2 g/dL (ref 6.0–8.3)

## 2023-04-07 LAB — MAGNESIUM: Magnesium: 2.1 mg/dL (ref 1.5–2.5)

## 2023-04-07 LAB — LIPID PANEL
Cholesterol: 175 mg/dL (ref 0–200)
HDL: 39.9 mg/dL (ref >39.00)
LDL Cholesterol: 97 mg/dL (ref 0–99)
NonHDL: 134.91
Total CHOL/HDL Ratio: 4
Triglycerides: 189 mg/dL — ABNORMAL HIGH (ref 0.0–149.0)
VLDL: 37.8 mg/dL (ref 0.0–40.0)

## 2023-04-07 LAB — T4, FREE: Free T4: 1.03 ng/dL (ref 0.60–1.60)

## 2023-04-07 LAB — TSH: TSH: 2.27 u[IU]/mL (ref 0.35–5.50)

## 2023-04-14 ENCOUNTER — Ambulatory Visit (INDEPENDENT_AMBULATORY_CARE_PROVIDER_SITE_OTHER): Payer: Medicare Other | Admitting: Family Medicine

## 2023-04-14 ENCOUNTER — Encounter: Payer: Self-pay | Admitting: Family Medicine

## 2023-04-14 VITALS — BP 170/68 | HR 65 | Temp 97.5°F | Ht 60.25 in | Wt 129.0 lb

## 2023-04-14 DIAGNOSIS — E559 Vitamin D deficiency, unspecified: Secondary | ICD-10-CM

## 2023-04-14 DIAGNOSIS — R011 Cardiac murmur, unspecified: Secondary | ICD-10-CM | POA: Diagnosis not present

## 2023-04-14 DIAGNOSIS — F5104 Psychophysiologic insomnia: Secondary | ICD-10-CM | POA: Diagnosis not present

## 2023-04-14 DIAGNOSIS — H9313 Tinnitus, bilateral: Secondary | ICD-10-CM | POA: Diagnosis not present

## 2023-04-14 DIAGNOSIS — E876 Hypokalemia: Secondary | ICD-10-CM

## 2023-04-14 DIAGNOSIS — M810 Age-related osteoporosis without current pathological fracture: Secondary | ICD-10-CM

## 2023-04-14 DIAGNOSIS — I10 Essential (primary) hypertension: Secondary | ICD-10-CM | POA: Diagnosis not present

## 2023-04-14 DIAGNOSIS — Z7189 Other specified counseling: Secondary | ICD-10-CM

## 2023-04-14 DIAGNOSIS — E7800 Pure hypercholesterolemia, unspecified: Secondary | ICD-10-CM | POA: Diagnosis not present

## 2023-04-14 MED ORDER — LOSARTAN POTASSIUM 100 MG PO TABS
100.0000 mg | ORAL_TABLET | Freq: Every day | ORAL | 4 refills | Status: DC
Start: 2023-04-14 — End: 2023-06-16

## 2023-04-14 MED ORDER — PROPRANOLOL HCL ER 60 MG PO CP24
60.0000 mg | ORAL_CAPSULE | Freq: Every day | ORAL | 4 refills | Status: DC
Start: 2023-04-14 — End: 2024-04-14

## 2023-04-14 MED ORDER — VITAMIN D3 1.25 MG (50000 UT) PO TABS
1.0000 | ORAL_TABLET | ORAL | 4 refills | Status: DC
Start: 2023-04-14 — End: 2024-04-14

## 2023-04-14 MED ORDER — ATORVASTATIN CALCIUM 10 MG PO TABS
10.0000 mg | ORAL_TABLET | Freq: Every day | ORAL | 4 refills | Status: DC
Start: 2023-04-14 — End: 2024-04-14

## 2023-04-14 MED ORDER — ALPRAZOLAM 0.5 MG PO TABS
0.5000 mg | ORAL_TABLET | Freq: Every evening | ORAL | 0 refills | Status: DC | PRN
Start: 2023-04-14 — End: 2023-09-09

## 2023-04-14 MED ORDER — AMLODIPINE BESYLATE 10 MG PO TABS: 10.0000 mg | ORAL_TABLET | Freq: Every day | ORAL | 4 refills | Status: DC

## 2023-04-14 NOTE — Assessment & Plan Note (Signed)
Continue weekly vit D replacement. 

## 2023-04-14 NOTE — Progress Notes (Addendum)
Ph: [phone] Fax: [phone]   Patient ID: Robin Peck, female    DOB: 09-29-1945, 77 y.o.   MRN: [id number]  This visit was conducted in person.  BP (!) 170/68   Pulse 65   Temp (!) 97.5 F (36.4 C) (Temporal)   Ht 5' 0.25" (1.53 m)   Wt 129 lb (58.5 kg)   SpO2 98%   BMI 24.98 kg/m   BP Readings from Last 3 Encounters:  04/14/23 (!) 170/68  03/26/23 131/72  10/11/22 (!) 150/80  BP at home this morning 146/72 180/74 on repeat  CC: CPE Subjective:   HPI: Robin Peck is a 78 y.o. female presenting on 04/14/2023 for Annual Exam (MCR prt 2 [AWV- 03/26/23]. Wants to discuss alprazolam dose. Pt brought home BP monitor to compare. Reading in office today- 186/86.)   Saw health advisor last month for medicare wellness visit. Note reviewed.   No results found.  Flowsheet Row Office Visit from 04/14/2023 in [facility] [place]  PHQ-2 Total Score 0          04/14/2023    9:00 AM 03/26/2023   10:02 AM 10/11/2022    9:27 AM 03/22/2022    3:09 PM 03/15/2021    8:35 AM  Fall Risk   Falls in the past year? 0 0 0 0 1  Number falls in past yr:  0  0 0  Injury with Fall?  0  0 0  Risk for fall due to :  No Fall Risks  No Fall Risks Medication side effect  Follow up  Falls prevention discussed;Falls evaluation completed   Falls evaluation completed;Falls prevention discussed   Tightness to knee after feeling pop early 03/2023.   HTN - continues amlodipine 5mg  QAM daily, losartan 100mg  qhs, and potassium daily. Also on propranolol LA 60mg  daily. Home BPs running well controlled 120-140s/50-60s.    Pregabalin stopped 01/2021.  Vit D replacement helped resolve body aches.  Ongoing difficulty with insomnia - manages with PRN xanax. She has tried trazodone, melatonin, Relaxium without any long lasting benefit.   R>L chronic tinnitus for years. Insomnia worsens tinnitus. Hearing aides help during the day. Uses cotton balls to sleep at night.    Brothers x2 passed away from MI, latest 2 yrs ago.   Preventative: Cologuard positive last year - Colonoscopy 03/2020 - 3.5 cm polyp - rec return for removal  Colonoscopy 06/2020 - full piecemeal removal - SSP and TA - rec repeat colonoscopy 6-9 months (Mansouraty)  Colonoscopy 07/2022 - benign polyp, hemorrhoids, rpt 3 yrs given large adenoma hx (Mansouraty, Netty Starring., MD)  Mammogram 06/2022 - Birads1 @ [facility] Well woman exam - >10 yrs ago - has not seen GYN recently. No pelvic pain, vaginal bleeding  DEXA 04/2020: T score -2.7 spine, -2.6 L hip (osteoporosis) - has made healthy diet choices. Discussed weight bearing exercises. Declines medication to treat this. Will repeat this year.  Lung cancer screening - not eligible Flu shot - 08/2020 COVID vaccine - didn't receive  Prevnar-13 2017 - didn't feel well after this. Pneumovax23 04/2022 Td 2017  Shingrix - 10/2019, 01/2020 RSV - to consider through pharmacy Advanced directive discussion - brought, scanned 04/2023. Husband is HCPOA - no HCPOA form filled out. ok with feeding tube. No prolonged life support if terminal condition.  Seat belt use discussed Sunscreen use discussed. No changing moles on skin.  Sleep - averaging 6-7 hours/night  Non smoker Alcohol -  none Dentist - yearly - planned dental implants  Eye exam - yearly - s/p cataract extraction summer 2023 Bowel - no constipation  Bladder - some stress incontinence, not bothersome   Lives with husband Dorene Sorrow.      Relevant past medical, surgical, family and social history reviewed and updated as indicated. Interim medical history since our last visit reviewed. Allergies and medications reviewed and updated. Outpatient Medications Prior to Visit  Medication Sig Dispense Refill   ascorbic acid (VITAMIN C) 500 MG tablet Take 500 mg by mouth daily in the afternoon.     cetirizine (ZYRTEC) 10 MG tablet Take 1 tablet (10 mg total) by mouth daily as needed (seasonal  allergies.). (Patient taking differently: Take 10 mg by mouth in the morning.)     famotidine (PEPCID) 20 MG tablet Take 1 tablet (20 mg total) by mouth at bedtime.     KLOR-CON M20 20 MEQ tablet TAKE 1 TABLET BY MOUTH DAILY 90 tablet 3   OVER THE COUNTER MEDICATION Take 1 tablet by mouth daily in the afternoon. SUPER BEETS     TURMERIC PO Take 1 tablet by mouth daily in the afternoon. GUMMIES     Zinc 30 MG CAPS Take 1 capsule (30 mg total) by mouth daily. (Patient taking differently: Take 1 capsule by mouth daily in the afternoon.) 30 capsule    ALPRAZolam (XANAX) 0.25 MG tablet TAKE 1 TABLET BY MOUTH TWICE A DAY AS NEEDED FOR ANXIETY OR SLEEP 30 tablet 0   atorvastatin (LIPITOR) 10 MG tablet TAKE 1 TABLET BY MOUTH DAILY (Patient taking differently: Take 10 mg by mouth at bedtime.) 90 tablet 3   Cholecalciferol (VITAMIN D3) 1.25 MG (50000 UT) TABS Take 1 tablet by mouth once a week. 12 tablet 1   losartan (COZAAR) 100 MG tablet Take 1 tablet (100 mg total) by mouth daily. (Patient taking differently: Take 100 mg by mouth at bedtime.) 90 tablet 3   NORVASC 5 MG tablet Take 1 tablet (5 mg total) by mouth daily. 90 tablet 3   propranolol ER (INDERAL LA) 60 MG 24 hr capsule TAKE 1 CAPSULE BY MOUTH DAILY 90 capsule 3   hydrochlorothiazide (HYDRODIURIL) 25 MG tablet TAKE ONE TABLET BY MOUTH DAILY 90 tablet 0   No facility-administered medications prior to visit.     Per HPI unless specifically indicated in ROS section below Review of Systems  Objective:  BP (!) 170/68   Pulse 65   Temp (!) 97.5 F (36.4 C) (Temporal)   Ht 5' 0.25" (1.53 m)   Wt 129 lb (58.5 kg)   SpO2 98%   BMI 24.98 kg/m   Wt Readings from Last 3 Encounters:  04/14/23 129 lb (58.5 kg)  03/26/23 126 lb (57.2 kg)  10/11/22 128 lb (58.1 kg)      Physical Exam Vitals and nursing note reviewed.  Constitutional:      Appearance: Normal appearance. She is not ill-appearing.  HENT:     Head: Normocephalic and  atraumatic.     Right Ear: Tympanic membrane, ear canal and external ear normal. There is no impacted cerumen.     Left Ear: Tympanic membrane, ear canal and external ear normal. There is no impacted cerumen.     Mouth/Throat:     Mouth: Mucous membranes are moist.     Pharynx: Oropharynx is clear. No oropharyngeal exudate or posterior oropharyngeal erythema.  Eyes:     General:        Right eye: No  discharge.        Left eye: No discharge.     Extraocular Movements: Extraocular movements intact.     Conjunctiva/sclera: Conjunctivae normal.     Pupils: Pupils are equal, round, and reactive to light.  Neck:     Thyroid: No thyroid mass or thyromegaly.     Vascular: Carotid bruit (?R sided) present.  Cardiovascular:     Rate and Rhythm: Normal rate and regular rhythm.     Pulses: Normal pulses.     Heart sounds: Murmur (2/6 systolic USB) heard.  Pulmonary:     Effort: Pulmonary effort is normal. No respiratory distress.     Breath sounds: Normal breath sounds. No wheezing, rhonchi or rales.  Abdominal:     General: Bowel sounds are normal. There is no distension.     Palpations: Abdomen is soft. There is no mass.     Tenderness: There is no abdominal tenderness. There is no guarding or rebound.     Hernia: No hernia is present.  Musculoskeletal:     Cervical back: Normal range of motion and neck supple. No rigidity.     Right lower leg: No edema.     Left lower leg: No edema.  Lymphadenopathy:     Cervical: No cervical adenopathy.  Skin:    General: Skin is warm and dry.     Findings: No rash.  Neurological:     General: No focal deficit present.     Mental Status: She is alert. Mental status is at baseline.  Psychiatric:        Mood and Affect: Mood normal.        Behavior: Behavior normal.       Results for orders placed or performed in visit on 04/07/23  CBC with Differential/Platelet  Result Value Ref Range   WBC 6.3 4.0 - 10.5 K/uL   RBC 4.28 3.87 - 5.11 Mil/uL    Hemoglobin 12.4 12.0 - 15.0 g/dL   HCT 52.8 41.3 - 24.4 %   MCV 86.9 78.0 - 100.0 fl   MCHC 33.3 30.0 - 36.0 g/dL   RDW 01.0 27.2 - 53.6 %   Platelets 316.0 150.0 - 400.0 K/uL   Neutrophils Relative % 39.9 (L) 43.0 - 77.0 %   Lymphocytes Relative 46.4 (H) 12.0 - 46.0 %   Monocytes Relative 8.7 3.0 - 12.0 %   Eosinophils Relative 4.7 0.0 - 5.0 %   Basophils Relative 0.3 0.0 - 3.0 %   Neutro Abs 2.5 1.4 - 7.7 K/uL   Lymphs Abs 2.9 0.7 - 4.0 K/uL   Monocytes Absolute 0.5 0.1 - 1.0 K/uL   Eosinophils Absolute 0.3 0.0 - 0.7 K/uL   Basophils Absolute 0.0 0.0 - 0.1 K/uL  VITAMIN D 25 Hydroxy (Vit-D Deficiency, Fractures)  Result Value Ref Range   VITD 56.39 30.00 - 100.00 ng/mL  Magnesium  Result Value Ref Range   Magnesium 2.1 1.5 - 2.5 mg/dL  T4, free  Result Value Ref Range   Free T4 1.03 0.60 - 1.60 ng/dL  TSH  Result Value Ref Range   TSH 2.27 0.35 - 5.50 uIU/mL  Comprehensive metabolic panel  Result Value Ref Range   Sodium 140 135 - 145 mEq/L   Potassium 4.6 3.5 - 5.1 mEq/L   Chloride 106 96 - 112 mEq/L   CO2 26 19 - 32 mEq/L   Glucose, Bld 93 70 - 99 mg/dL   BUN 22 6 - 23 mg/dL   Creatinine, Ser  0.85 0.40 - 1.20 mg/dL   Total Bilirubin 0.6 0.2 - 1.2 mg/dL   Alkaline Phosphatase 105 39 - 117 U/L   AST 15 0 - 37 U/L   ALT 11 0 - 35 U/L   Total Protein 7.2 6.0 - 8.3 g/dL   Albumin 4.0 3.5 - 5.2 g/dL   GFR 71.69 >67.89 mL/min   Calcium 10.0 8.4 - 10.5 mg/dL  Lipid panel  Result Value Ref Range   Cholesterol 175 0 - 200 mg/dL   Triglycerides 381.0 (H) 0.0 - 149.0 mg/dL   HDL 17.51 >02.58 mg/dL   VLDL 52.7 0.0 - 78.2 mg/dL   LDL Cholesterol 97 0 - 99 mg/dL   Total CHOL/HDL Ratio 4    NonHDL 134.91     Assessment & Plan:   Problem List Items Addressed This Visit     Advanced care planning/counseling discussion - Primary (Chronic)    Advanced directive discussion - brought, scanned 04/2023. Husband is HCPOA - no HCPOA form filled out. ok with feeding tube. No  prolonged life support if terminal condition.       HLD (hyperlipidemia)    Chronic, overall stable period on atorvastatin with mildly elevated triglycerides. The 10-year ASCVD risk score (Arnett DK, et al., 2019) is: 39.9%   Values used to calculate the score:     Age: 41 years     Sex: Female     Is Non-Hispanic African American: No     Diabetic: No     Tobacco smoker: No     Systolic Blood Pressure: 170 mmHg     Is BP treated: Yes     HDL Cholesterol: 39.9 mg/dL     Total Cholesterol: 175 mg/dL       Relevant Medications   atorvastatin (LIPITOR) 10 MG tablet   losartan (COZAAR) 100 MG tablet   propranolol ER (INDERAL LA) 60 MG 24 hr capsule   amLODipine (NORVASC) 10 MG tablet   Essential hypertension    Chronic, deteriorated. She notes she's increased salt intake in h/o hyponatremia - advised decrease salt/sodium in diet. Will also increase amlodipine to 10mg  daily. Update aldosterone/renin levels, consider starting spironolactone. Avoiding hydrochlorothiazide in h/o marked hyponatremia.       Relevant Medications   atorvastatin (LIPITOR) 10 MG tablet   losartan (COZAAR) 100 MG tablet   propranolol ER (INDERAL LA) 60 MG 24 hr capsule   amLODipine (NORVASC) 10 MG tablet   Other Relevant Orders   Aldosterone + renin activity w/ ratio   Chronic hypokalemia    Continue Kdur supplement daily. In worsening bp control, update aldo/renin levels.       Tinnitus aurium, bilateral    Chronic issue.       Chronic insomnia    Chronic issue.  Poor response to trazodone, melatonin, Relaxium OTC sleep supplement.  Continue nightly xanax, increasing dose to 0.5mg  PRN.  Consider nortriptyline.       Relevant Medications   ALPRAZolam (XANAX) 0.5 MG tablet   Osteoporosis    Continues weekly vitamin D  Update DEXA at next mammogram appointment.       Relevant Medications   Cholecalciferol (VITAMIN D3) 1.25 MG (50000 UT) TABS   Other Relevant Orders   DG Bone  Density   Vitamin D deficiency    Continue weekly vit D replacement.       Relevant Medications   Cholecalciferol (VITAMIN D3) 1.25 MG (50000 UT) TABS   Systolic murmur    Faint, heard  today with presumed radiation to carotid. Reassess with better BP control.  Consider echo.         Meds ordered this encounter  Medications   ALPRAZolam (XANAX) 0.5 MG tablet    Sig: Take 1 tablet (0.5 mg total) by mouth at bedtime as needed for sleep.    Dispense:  30 tablet    Refill:  0    Note new dose   atorvastatin (LIPITOR) 10 MG tablet    Sig: Take 1 tablet (10 mg total) by mouth daily.    Dispense:  90 tablet    Refill:  4   Cholecalciferol (VITAMIN D3) 1.25 MG (50000 UT) TABS    Sig: Take 1 tablet by mouth once a week.    Dispense:  12 tablet    Refill:  4   losartan (COZAAR) 100 MG tablet    Sig: Take 1 tablet (100 mg total) by mouth daily.    Dispense:  90 tablet    Refill:  4   propranolol ER (INDERAL LA) 60 MG 24 hr capsule    Sig: Take 1 capsule (60 mg total) by mouth daily.    Dispense:  90 capsule    Refill:  4   amLODipine (NORVASC) 10 MG tablet    Sig: Take 1 tablet (10 mg total) by mouth daily.    Dispense:  90 tablet    Refill:  4    Note new dose    Orders Placed This Encounter  Procedures   DG Bone Density    Standing Status:   Future    Standing Expiration Date:   04/13/2024    Order Specific Question:   Reason for Exam (SYMPTOM  OR DIAGNOSIS REQUIRED)    Answer:   f/u osteoporosis    Order Specific Question:   Preferred imaging location?    Answer:   [facility]   Aldosterone + renin activity w/ ratio    Patient Instructions  Good to see you today.  Repeat lab test today  Health care power of attorney form provided today Consider nortriptyline medicine for sleep.  When you call to schedule your mammogram, ask to also do bone density scan.  Consider RSV vaccine through local pharmacy.  Try Kegel exercises for stress incontinence of urine  symptoms.   Bedtime routine checklist: 1. Avoid naps during the day 2. Avoid stimulants such as caffeine and nicotine. Avoid bedtime alcohol (it can speed onset of sleep but the body's metabolism can cause awakenings). 3. All forms of exercise help ensure sound sleep - limit vigorous exercise to morning or late afternoon 4. Avoid food too close to bedtime including chocolate (which contains caffeine) 5. Soak up natural light 6. Establish regular bedtime routine. 7. Associate bed with sleep - avoid TV, computer or phone, reading while in bed. 8. Ensure pleasant, relaxing sleep environment - quiet, dark, cool room.   Return as needed or in 1 year for next wellness visit.   Follow up plan: Return in about 1 month (around 05/15/2023), or if symptoms worsen or fail to improve, for follow up visit.  Eustaquio Boyden, MD

## 2023-04-14 NOTE — Assessment & Plan Note (Signed)
Continue Kdur supplement daily. In worsening bp control, update aldo/renin levels.

## 2023-04-14 NOTE — Patient Instructions (Addendum)
Good to see you today.  Repeat lab test today  Health care power of attorney form provided today Consider nortriptyline medicine for sleep.  When you call to schedule your mammogram, ask to also do bone density scan.  Consider RSV vaccine through local pharmacy.  Try Kegel exercises for stress incontinence of urine symptoms.   Bedtime routine checklist: 1. Avoid naps during the day 2. Avoid stimulants such as caffeine and nicotine. Avoid bedtime alcohol (it can speed onset of sleep but the body's metabolism can cause awakenings). 3. All forms of exercise help ensure sound sleep - limit vigorous exercise to morning or late afternoon 4. Avoid food too close to bedtime including chocolate (which contains caffeine) 5. Soak up natural light 6. Establish regular bedtime routine. 7. Associate bed with sleep - avoid TV, computer or phone, reading while in bed. 8. Ensure pleasant, relaxing sleep environment - quiet, dark, cool room.   Return as needed or in 1 year for next wellness visit.

## 2023-04-14 NOTE — Assessment & Plan Note (Addendum)
Faint, heard today with presumed radiation to carotid. Reassess with better BP control.  Consider echo.

## 2023-04-14 NOTE — Assessment & Plan Note (Signed)
Chronic, deteriorated. She notes she's increased salt intake in h/o hyponatremia - advised decrease salt/sodium in diet. Will also increase amlodipine to 10mg  daily. Update aldosterone/renin levels, consider starting spironolactone. Avoiding hydrochlorothiazide in h/o marked hyponatremia.

## 2023-04-14 NOTE — Assessment & Plan Note (Signed)
Chronic issue.  

## 2023-04-14 NOTE — Assessment & Plan Note (Signed)
Chronic, overall stable period on atorvastatin with mildly elevated triglycerides. The 10-year ASCVD risk score (Arnett DK, et al., 2019) is: 39.9%   Values used to calculate the score:     Age: 77 years     Sex: Female     Is Non-Hispanic African American: No     Diabetic: No     Tobacco smoker: No     Systolic Blood Pressure: 170 mmHg     Is BP treated: Yes     HDL Cholesterol: 39.9 mg/dL     Total Cholesterol: 175 mg/dL

## 2023-04-14 NOTE — Assessment & Plan Note (Signed)
Continues weekly vitamin D  Update DEXA at next mammogram appointment.

## 2023-04-14 NOTE — Assessment & Plan Note (Signed)
Chronic issue.  Poor response to trazodone, melatonin, Relaxium OTC sleep supplement.  Continue nightly xanax, increasing dose to 0.5mg  PRN.  Consider nortriptyline.

## 2023-04-14 NOTE — Assessment & Plan Note (Addendum)
Advanced directive discussion - brought, scanned 04/2023. Husband is HCPOA - no HCPOA form filled out. ok with feeding tube. No prolonged life support if terminal condition.

## 2023-04-15 DIAGNOSIS — M25561 Pain in right knee: Secondary | ICD-10-CM | POA: Diagnosis not present

## 2023-04-16 DIAGNOSIS — Y999 Unspecified external cause status: Secondary | ICD-10-CM | POA: Diagnosis not present

## 2023-04-16 DIAGNOSIS — X58XXXA Exposure to other specified factors, initial encounter: Secondary | ICD-10-CM | POA: Diagnosis not present

## 2023-04-16 DIAGNOSIS — M65861 Other synovitis and tenosynovitis, right lower leg: Secondary | ICD-10-CM | POA: Diagnosis not present

## 2023-04-16 DIAGNOSIS — S83241A Other tear of medial meniscus, current injury, right knee, initial encounter: Secondary | ICD-10-CM | POA: Diagnosis not present

## 2023-04-17 ENCOUNTER — Encounter (INDEPENDENT_AMBULATORY_CARE_PROVIDER_SITE_OTHER): Payer: Self-pay

## 2023-04-22 DIAGNOSIS — M6281 Muscle weakness (generalized): Secondary | ICD-10-CM | POA: Diagnosis not present

## 2023-04-22 DIAGNOSIS — M25661 Stiffness of right knee, not elsewhere classified: Secondary | ICD-10-CM | POA: Diagnosis not present

## 2023-04-22 DIAGNOSIS — R262 Difficulty in walking, not elsewhere classified: Secondary | ICD-10-CM | POA: Diagnosis not present

## 2023-04-22 DIAGNOSIS — S83231D Complex tear of medial meniscus, current injury, right knee, subsequent encounter: Secondary | ICD-10-CM | POA: Diagnosis not present

## 2023-04-25 DIAGNOSIS — S83231D Complex tear of medial meniscus, current injury, right knee, subsequent encounter: Secondary | ICD-10-CM | POA: Diagnosis not present

## 2023-04-29 DIAGNOSIS — R262 Difficulty in walking, not elsewhere classified: Secondary | ICD-10-CM | POA: Diagnosis not present

## 2023-04-29 DIAGNOSIS — S83231D Complex tear of medial meniscus, current injury, right knee, subsequent encounter: Secondary | ICD-10-CM | POA: Diagnosis not present

## 2023-04-29 DIAGNOSIS — M25661 Stiffness of right knee, not elsewhere classified: Secondary | ICD-10-CM | POA: Diagnosis not present

## 2023-04-29 DIAGNOSIS — M6281 Muscle weakness (generalized): Secondary | ICD-10-CM | POA: Diagnosis not present

## 2023-05-01 DIAGNOSIS — R262 Difficulty in walking, not elsewhere classified: Secondary | ICD-10-CM | POA: Diagnosis not present

## 2023-05-01 DIAGNOSIS — M6281 Muscle weakness (generalized): Secondary | ICD-10-CM | POA: Diagnosis not present

## 2023-05-01 DIAGNOSIS — S83231D Complex tear of medial meniscus, current injury, right knee, subsequent encounter: Secondary | ICD-10-CM | POA: Diagnosis not present

## 2023-05-01 DIAGNOSIS — M25661 Stiffness of right knee, not elsewhere classified: Secondary | ICD-10-CM | POA: Diagnosis not present

## 2023-05-06 DIAGNOSIS — M6281 Muscle weakness (generalized): Secondary | ICD-10-CM | POA: Diagnosis not present

## 2023-05-06 DIAGNOSIS — S83231D Complex tear of medial meniscus, current injury, right knee, subsequent encounter: Secondary | ICD-10-CM | POA: Diagnosis not present

## 2023-05-06 DIAGNOSIS — R262 Difficulty in walking, not elsewhere classified: Secondary | ICD-10-CM | POA: Diagnosis not present

## 2023-05-06 DIAGNOSIS — M25661 Stiffness of right knee, not elsewhere classified: Secondary | ICD-10-CM | POA: Diagnosis not present

## 2023-05-12 ENCOUNTER — Other Ambulatory Visit: Payer: Self-pay | Admitting: Family Medicine

## 2023-05-12 DIAGNOSIS — Z1231 Encounter for screening mammogram for malignant neoplasm of breast: Secondary | ICD-10-CM

## 2023-05-16 ENCOUNTER — Ambulatory Visit (INDEPENDENT_AMBULATORY_CARE_PROVIDER_SITE_OTHER): Payer: Medicare Other | Admitting: Family Medicine

## 2023-05-16 ENCOUNTER — Encounter: Payer: Self-pay | Admitting: Family Medicine

## 2023-05-16 VITALS — BP 158/66 | HR 65 | Temp 97.3°F | Ht 60.25 in | Wt 124.4 lb

## 2023-05-16 DIAGNOSIS — E876 Hypokalemia: Secondary | ICD-10-CM

## 2023-05-16 DIAGNOSIS — H9313 Tinnitus, bilateral: Secondary | ICD-10-CM | POA: Diagnosis not present

## 2023-05-16 DIAGNOSIS — H9193 Unspecified hearing loss, bilateral: Secondary | ICD-10-CM | POA: Diagnosis not present

## 2023-05-16 DIAGNOSIS — I10 Essential (primary) hypertension: Secondary | ICD-10-CM | POA: Diagnosis not present

## 2023-05-16 MED ORDER — SPIRONOLACTONE 25 MG PO TABS: 12.5000 mg | ORAL_TABLET | Freq: Every day | ORAL | 3 refills | Status: DC

## 2023-05-16 NOTE — Assessment & Plan Note (Signed)
Chronic issue. Has seen [facility] 2016.  Wears hearing aides. Notes aspirin, NSAIDs worsen tinnitus.

## 2023-05-16 NOTE — Assessment & Plan Note (Signed)
Regularly wearing hearing aides.

## 2023-05-16 NOTE — Assessment & Plan Note (Addendum)
Normal aldo/renin levels 2022 and again 2024.  She has been taking Kdur daily.  Start spironolactone 12.5mg  daily as per above. Drop Kdur to daily. Reassess K levels 4d after starting.

## 2023-05-16 NOTE — Progress Notes (Signed)
Ph: [phone] Fax: [phone]   Patient ID: Robin Peck, female    DOB: [birth year], 76 y.o.   MRN: [id number]  This visit was conducted in person.  BP (!) 158/66   Pulse 65   Temp (!) 97.3 F (36.3 C) (Temporal)   Ht 5' 0.25" (1.53 m)   Wt 124 lb 6 oz (56.4 kg)   SpO2 99%   BMI 24.09 kg/m   BP Readings from Last 3 Encounters:  05/16/23 (!) 158/66  04/14/23 (!) 170/68  03/26/23 131/72  160/60 on recheck  CC: HTN f/u visit  Subjective:   HPI: Robin Peck is a 77 y.o. female presenting on 05/16/2023 for Medical Management of Chronic Issues (Here for 1 mo HTN f/u.)   HTN - Compliant with current antihypertensive regimen of amlodipine 10mg  daily, losartan 100mg  daily. Avoid hydrochlorothiazide in h/o hyponatremia. Doe check blood pressures at home: this morning 120/64. This is an accurate BP cuff. All readings 120-140 systolic at home. No low blood pressure readings or symptoms of dizziness/syncope.  Denies vision changes, CP/tightness, SOB. Occ HA with elevated BP readings. She also notes new bilateral leg swelling, R>L.   She continues propranolol LA 60mg  daily - denies h/o migraines or tremors. She feels this was started for anxiety.   Had injury while walking grocery store - seen by Dr Thurston Hole [facility] - suffered meniscal injury last month as well as found arthritis to the knee so she underwent arthroscopic surgery. She also had course of PT. She continues aspirin 81mg  daily after surgery. She was also started on meloxicam 7.5mg  daily but Riley Nearing is upsetting on stomach. She also noted it worsened tinnitus.  Chronic tinnitus with hearing loss R>L that started remotely during trip to [place] while driving through the mountains. She saw [facility] and now wears hearing aides. She had reassuring MRI. She notes NSAIDs and aspirin worsen tinnitus. This affects sleeping, No vertigo. Planning trip to [place] at end of year.      Relevant past medical, surgical,  family and social history reviewed and updated as indicated. Interim medical history since our last visit reviewed. Allergies and medications reviewed and updated. Outpatient Medications Prior to Visit  Medication Sig Dispense Refill   ALPRAZolam (XANAX) 0.5 MG tablet Take 1 tablet (0.5 mg total) by mouth at bedtime as needed for sleep. 30 tablet 0   ascorbic acid (VITAMIN C) 500 MG tablet Take 500 mg by mouth daily in the afternoon.     aspirin 81 MG chewable tablet Chew 81 mg by mouth 2 (two) times daily.     atorvastatin (LIPITOR) 10 MG tablet Take 1 tablet (10 mg total) by mouth daily. 90 tablet 4   cetirizine (ZYRTEC) 10 MG tablet Take 1 tablet (10 mg total) by mouth daily as needed (seasonal allergies.). (Patient taking differently: Take 10 mg by mouth in the morning.)     Cholecalciferol (VITAMIN D3) 1.25 MG (50000 UT) TABS Take 1 tablet by mouth once a week. 12 tablet 4   famotidine (PEPCID) 20 MG tablet Take 1 tablet (20 mg total) by mouth at bedtime.     ibuprofen (ADVIL) 200 MG tablet Take 2 tablets (400 mg total) by mouth 2 (two) times daily as needed for moderate pain.     losartan (COZAAR) 100 MG tablet Take 1 tablet (100 mg total) by mouth daily. 90 tablet 4   OVER THE COUNTER MEDICATION Take 1 tablet by mouth daily in the afternoon. SUPER  BEETS     propranolol ER (INDERAL LA) 60 MG 24 hr capsule Take 1 capsule (60 mg total) by mouth daily. 90 capsule 4   TURMERIC PO Take 1 tablet by mouth daily in the afternoon. GUMMIES     Zinc 30 MG CAPS Take 1 capsule (30 mg total) by mouth daily. (Patient taking differently: Take 1 capsule by mouth daily in the afternoon.) 30 capsule    amLODipine (NORVASC) 10 MG tablet Take 1 tablet (10 mg total) by mouth daily. 90 tablet 4   KLOR-CON M20 20 MEQ tablet TAKE 1 TABLET BY MOUTH DAILY 90 tablet 3   meloxicam (MOBIC) 7.5 MG tablet Take 7.5 mg by mouth 2 (two) times daily.     amLODipine (NORVASC) 10 MG tablet Take 0.5 tablets (5 mg total) by mouth  daily.     potassium chloride SA (KLOR-CON M20) 20 MEQ tablet Take 0.5 tablets (10 mEq total) by mouth daily.     No facility-administered medications prior to visit.     Per HPI unless specifically indicated in ROS section below Review of Systems  Objective:  BP (!) 158/66   Pulse 65   Temp (!) 97.3 F (36.3 C) (Temporal)   Ht 5' 0.25" (1.53 m)   Wt 124 lb 6 oz (56.4 kg)   SpO2 99%   BMI 24.09 kg/m   Wt Readings from Last 3 Encounters:  05/16/23 124 lb 6 oz (56.4 kg)  04/14/23 129 lb (58.5 kg)  03/26/23 126 lb (57.2 kg)      Physical Exam Vitals and nursing note reviewed.  Constitutional:      Appearance: Normal appearance. She is not ill-appearing.  HENT:     Right Ear: Tympanic membrane, ear canal and external ear normal.     Left Ear: Tympanic membrane, ear canal and external ear normal.     Ears:     Comments: Wears hearing aides due to chronic hearing impairment.  Cardiovascular:     Rate and Rhythm: Normal rate and regular rhythm.     Pulses: Normal pulses.     Heart sounds: Normal heart sounds. No murmur heard. Pulmonary:     Effort: Pulmonary effort is normal. No respiratory distress.     Breath sounds: No wheezing, rhonchi or rales.     Comments: Crackles R>L lower lobes Musculoskeletal:        General: Swelling present.     Cervical back: Normal range of motion and neck supple.     Right lower leg: Edema (1+) present.     Left lower leg: Edema (1+) present.  Skin:    General: Skin is warm and dry.     Findings: No rash.  Neurological:     Mental Status: She is alert.  Psychiatric:        Mood and Affect: Mood normal.        Behavior: Behavior normal.        Assessment & Plan:   Problem List Items Addressed This Visit     Essential hypertension - Primary    Chronic, improved but now leg swelling noted in setting of increased amlodipine and recent NSAID (meloxicam) use.  Will drop amlodipine back to 5mg  daily and start spironolactone 12.5mg   daily, dropping potassium from to once daily.  RTC early next week for potassium check and again in 2 wks (lab visit only).  RTC 1 mo HTN f/u visit.       Relevant Medications   aspirin  81 MG chewable tablet   amLODipine (NORVASC) 10 MG tablet   spironolactone (ALDACTONE) 25 MG tablet   Other Relevant Orders   Basic Metabolic Panel   CBC with Differential/Platelet   Chronic hypokalemia    Normal aldo/renin levels 2022 and again 2024.  She has been taking Kdur daily.  Start spironolactone 12.5mg  daily as per above. Drop Kdur to daily. Reassess K levels 4d after starting.       Tinnitus aurium, bilateral    Chronic issue. Has seen [facility] 2016.  Wears hearing aides. Notes aspirin, NSAIDs worsen tinnitus.       Decreased hearing    Regularly wearing hearing aides.         Meds ordered this encounter  Medications   spironolactone (ALDACTONE) 25 MG tablet    Sig: Take 0.5 tablets (12.5 mg total) by mouth daily.    Dispense:  30 tablet    Refill:  3    Orders Placed This Encounter  Procedures   Basic Metabolic Panel    Standing Status:   Future    Standing Expiration Date:   05/15/2024   CBC with Differential/Platelet    Standing Status:   Future    Standing Expiration Date:   05/15/2024    Patient Instructions  BP is better but still elevated in office today.  Let's drop amlodipine back to 5mg  daily. Drop potassium replacement to 1/2 tablet ( ) daily.  Start spironolactone 25mg  1/2 tablet (12.5mg ) daily - sent to pharmacy.  Schedule lab visit for early next week, and again in 2 weeks to monitor potassium on new spironolactone medicine.  Handout provided Return in 3-4 weeks for follow up blood pressure  Follow up plan: Return in about 4 weeks (around 06/13/2023) for follow up visit.  Eustaquio Boyden, MD

## 2023-05-16 NOTE — Assessment & Plan Note (Addendum)
Chronic, improved but now leg swelling noted in setting of increased amlodipine and recent NSAID (meloxicam) use.  Will drop amlodipine back to 5mg  daily and start spironolactone 12.5mg  daily, dropping potassium from to once daily.  RTC early next week for potassium check and again in 2 wks (lab visit only).  RTC 1 mo HTN f/u visit.

## 2023-05-16 NOTE — Patient Instructions (Addendum)
BP is better but still elevated in office today.  Let's drop amlodipine back to 5mg  daily. Drop potassium replacement to 1/2 tablet ( ) daily.  Start spironolactone 25mg  1/2 tablet (12.5mg ) daily - sent to pharmacy.  Schedule lab visit for early next week, and again in 2 weeks to monitor potassium on new spironolactone medicine.  Handout provided Return in 3-4 weeks for follow up blood pressure

## 2023-05-20 ENCOUNTER — Other Ambulatory Visit (INDEPENDENT_AMBULATORY_CARE_PROVIDER_SITE_OTHER): Payer: Medicare Other

## 2023-05-20 DIAGNOSIS — I10 Essential (primary) hypertension: Secondary | ICD-10-CM

## 2023-05-20 LAB — CBC WITH DIFFERENTIAL/PLATELET
Basophils Absolute: 0.1 10*3/uL (ref 0.0–0.1)
Basophils Relative: 0.8 % (ref 0.0–3.0)
Eosinophils Absolute: 0.3 10*3/uL (ref 0.0–0.7)
Eosinophils Relative: 4.9 % (ref 0.0–5.0)
HCT: 38.8 % (ref 36.0–46.0)
Hemoglobin: 12.6 g/dL (ref 12.0–15.0)
Lymphocytes Relative: 33.2 % (ref 12.0–46.0)
Lymphs Abs: 2.3 10*3/uL (ref 0.7–4.0)
MCHC: 32.6 g/dL (ref 30.0–36.0)
MCV: 88 fl (ref 78.0–100.0)
Monocytes Absolute: 0.6 10*3/uL (ref 0.1–1.0)
Monocytes Relative: 8.5 % (ref 3.0–12.0)
Neutro Abs: 3.7 10*3/uL (ref 1.4–7.7)
Neutrophils Relative %: 52.6 % (ref 43.0–77.0)
Platelets: 340 10*3/uL (ref 150.0–400.0)
RBC: 4.41 Mil/uL (ref 3.87–5.11)
RDW: 12.7 % (ref 11.5–15.5)
WBC: 7 10*3/uL (ref 4.0–10.5)

## 2023-05-20 LAB — BASIC METABOLIC PANEL
BUN: 17 mg/dL (ref 6–23)
CO2: 27 meq/L (ref 19–32)
Calcium: 9.8 mg/dL (ref 8.4–10.5)
Chloride: 104 meq/L (ref 96–112)
Creatinine, Ser: 0.85 mg/dL (ref 0.40–1.20)
GFR: 66.14 mL/min (ref >60.00)
Glucose, Bld: 106 mg/dL — ABNORMAL HIGH (ref 70–99)
Potassium: 3.9 meq/L (ref 3.5–5.1)
Sodium: 137 meq/L (ref 135–145)

## 2023-05-23 ENCOUNTER — Other Ambulatory Visit: Payer: Self-pay | Admitting: Family Medicine

## 2023-05-23 DIAGNOSIS — I10 Essential (primary) hypertension: Secondary | ICD-10-CM

## 2023-05-29 ENCOUNTER — Other Ambulatory Visit (INDEPENDENT_AMBULATORY_CARE_PROVIDER_SITE_OTHER): Payer: Medicare Other

## 2023-05-29 DIAGNOSIS — I10 Essential (primary) hypertension: Secondary | ICD-10-CM | POA: Diagnosis not present

## 2023-05-29 LAB — BASIC METABOLIC PANEL
BUN: 16 mg/dL (ref 6–23)
CO2: 25 mEq/L (ref 19–32)
Calcium: 9.8 mg/dL (ref 8.4–10.5)
Chloride: 107 mEq/L (ref 96–112)
Creatinine, Ser: 0.86 mg/dL (ref 0.40–1.20)
GFR: 65.21 mL/min (ref >60.00)
Glucose, Bld: 100 mg/dL — ABNORMAL HIGH (ref 70–99)
Potassium: 4.4 mEq/L (ref 3.5–5.1)
Sodium: 140 mEq/L (ref 135–145)

## 2023-06-04 ENCOUNTER — Other Ambulatory Visit: Payer: Medicare Other

## 2023-06-16 ENCOUNTER — Encounter: Payer: Self-pay | Admitting: Family Medicine

## 2023-06-16 ENCOUNTER — Ambulatory Visit: Payer: Medicare Other | Admitting: Family Medicine

## 2023-06-16 VITALS — BP 138/64 | HR 70 | Temp 98.2°F | Ht 60.25 in | Wt 128.1 lb

## 2023-06-16 DIAGNOSIS — I10 Essential (primary) hypertension: Secondary | ICD-10-CM

## 2023-06-16 DIAGNOSIS — F5104 Psychophysiologic insomnia: Secondary | ICD-10-CM | POA: Diagnosis not present

## 2023-06-16 DIAGNOSIS — E876 Hypokalemia: Secondary | ICD-10-CM | POA: Diagnosis not present

## 2023-06-16 MED ORDER — LOSARTAN POTASSIUM 50 MG PO TABS
50.0000 mg | ORAL_TABLET | Freq: Every day | ORAL | 1 refills | Status: DC
Start: 2023-06-16 — End: 2023-12-04

## 2023-06-16 MED ORDER — SPIRONOLACTONE 25 MG PO TABS: 25.0000 mg | ORAL_TABLET | Freq: Every day | ORAL | 6 refills | Status: DC

## 2023-06-16 MED ORDER — AMLODIPINE BESYLATE 5 MG PO TABS: 5.0000 mg | ORAL_TABLET | Freq: Every day | ORAL | 2 refills | Status: DC

## 2023-06-16 NOTE — Patient Instructions (Addendum)
Goal blood pressure: 120/70s.  New blood pressure regimen:  Amlodipine 5mg  daily in am Losartan 100mg  1/2 tablet (50mg ) daily at night Spironolactone (Aldactone) whole tablet 25mg  daily in am Propranolol LA 60mg  daily in am Schedule lab visit in 1 week to recheck potassium and kidneys Keep me updated with blood pressure readings.  Let me know if any questions.  Return in 6 weeks for follow up visit

## 2023-06-16 NOTE — Assessment & Plan Note (Signed)
Ongoing chronic insomnia due to tinnitus.

## 2023-06-16 NOTE — Progress Notes (Signed)
Ph: [phone] Fax: [phone]   Patient ID: Robin Peck, female    DOB: [birth year], 77 y.o.   MRN: [id number]  This visit was conducted in person.  BP 138/64   Pulse 70   Temp 98.2 F (36.8 C) (Oral)   Ht 5' 0.25" (1.53 m)   Wt 128 lb 2 oz (58.1 kg)   SpO2 99%   BMI 24.82 kg/m   BP Readings from Last 3 Encounters:  06/16/23 138/64  05/16/23 (!) 158/66  04/14/23 (!) 170/68   CC: 1 mo HTN f/u visit  Subjective:   HPI: Robin Peck is a 77 y.o. female presenting on 06/16/2023 for Medical Management of Chronic Issues (Here for 1 mo HTN f/u.)   HTN - Compliant with current antihypertensive regimen of amlodipine 5mg  daily (higher dose caused leg swelling), losartan 100mg  daily, last visit started spironolactone 12.5mg  daily, potassium dropped to daily. Avoid hydrochlorothiazide in h/o hyponatremia. Does check blood pressures at home: 120-130s/60-70s, few lows to 100s systolic. Home cuff has been compared with ours for accuracy. No low blood pressure readings or symptoms of dizziness/syncope.  Denies HA, vision changes, CP/tightness, SOB, leg swelling.    Chronic hypokalemia - normal aldo/renin levels 2022 and again 2024. She was previously on Kdur daily. More recently dropped to daily.   She also continues propranolol LA 60mg  daily for ?anxiety.   Stopped turmeric.   Upcoming trip to [place] at end of the year.      Relevant past medical, surgical, family and social history reviewed and updated as indicated. Interim medical history since our last visit reviewed. Allergies and medications reviewed and updated. Outpatient Medications Prior to Visit  Medication Sig Dispense Refill   ALPRAZolam (XANAX) 0.5 MG tablet Take 1 tablet (0.5 mg total) by mouth at bedtime as needed for sleep. 30 tablet 0   ascorbic acid (VITAMIN C) 500 MG tablet Take 500 mg by mouth daily in the afternoon.     aspirin 81 MG chewable tablet Chew 81 mg by mouth 2 (two)  times daily.     atorvastatin (LIPITOR) 10 MG tablet Take 1 tablet (10 mg total) by mouth daily. 90 tablet 4   cetirizine (ZYRTEC) 10 MG tablet Take 1 tablet (10 mg total) by mouth daily as needed (seasonal allergies.). (Patient taking differently: Take 10 mg by mouth in the morning.)     Cholecalciferol (VITAMIN D3) 1.25 MG (50000 UT) TABS Take 1 tablet by mouth once a week. 12 tablet 4   famotidine (PEPCID) 20 MG tablet Take 1 tablet (20 mg total) by mouth at bedtime.     ibuprofen (ADVIL) 200 MG tablet Take 2 tablets (400 mg total) by mouth 2 (two) times daily as needed for moderate pain.     OVER THE COUNTER MEDICATION Take 1 tablet by mouth daily in the afternoon. SUPER BEETS     propranolol ER (INDERAL LA) 60 MG 24 hr capsule Take 1 capsule (60 mg total) by mouth daily. 90 capsule 4   Zinc 30 MG CAPS Take 1 capsule (30 mg total) by mouth daily. (Patient taking differently: Take 1 capsule by mouth daily in the afternoon.) 30 capsule    amLODipine (NORVASC) 10 MG tablet Take 0.5 tablets (5 mg total) by mouth daily.     losartan (COZAAR) 100 MG tablet Take 1 tablet (100 mg total) by mouth daily. 90 tablet 4   potassium chloride SA (KLOR-CON M20) 20 MEQ tablet Take 0.5  tablets (10 mEq total) by mouth daily.     spironolactone (ALDACTONE) 25 MG tablet Take 0.5 tablets (12.5 mg total) by mouth daily. 30 tablet 3   TURMERIC PO Take 1 tablet by mouth daily in the afternoon. GUMMIES     losartan (COZAAR) 100 MG tablet Take 0.5 tablets (50 mg total) by mouth daily.     potassium chloride 20 MEQ TBCR Take 10 mEq by mouth 2 (two) times daily. 30 tablet 0   pregabalin (LYRICA) 50 MG capsule Take 1 capsule (50 mg total) by mouth daily. 90 capsule 1   No facility-administered medications prior to visit.     Per HPI unless specifically indicated in ROS section below Review of Systems  Objective:  BP 138/64   Pulse 70   Temp 98.2 F (36.8 C) (Oral)   Ht 5' 0.25" (1.53 m)   Wt 128 lb 2 oz (58.1 kg)    SpO2 99%   BMI 24.82 kg/m   Wt Readings from Last 3 Encounters:  06/16/23 128 lb 2 oz (58.1 kg)  05/16/23 124 lb 6 oz (56.4 kg)  04/14/23 129 lb (58.5 kg)      Physical Exam Vitals and nursing note reviewed.  Constitutional:      Appearance: Normal appearance. She is not ill-appearing.  HENT:     Head: Normocephalic and atraumatic.     Mouth/Throat:     Mouth: Mucous membranes are moist.     Pharynx: Oropharynx is clear. No oropharyngeal exudate or posterior oropharyngeal erythema.  Eyes:     Extraocular Movements: Extraocular movements intact.     Pupils: Pupils are equal, round, and reactive to light.  Cardiovascular:     Rate and Rhythm: Normal rate and regular rhythm.     Pulses: Normal pulses.     Heart sounds: Normal heart sounds. No murmur heard. Pulmonary:     Effort: Pulmonary effort is normal. No respiratory distress.     Breath sounds: Normal breath sounds. No wheezing, rhonchi or rales.  Musculoskeletal:     Right lower leg: No edema.     Left lower leg: No edema.  Skin:    General: Skin is warm and dry.     Findings: No rash.  Neurological:     Mental Status: She is alert.  Psychiatric:        Mood and Affect: Mood normal.        Behavior: Behavior normal.       Results for orders placed or performed in visit on 05/29/23  Basic metabolic panel  Result Value Ref Range   Sodium 140 135 - 145 mEq/L   Potassium 4.4 3.5 - 5.1 mEq/L   Chloride 107 96 - 112 mEq/L   CO2 25 19 - 32 mEq/L   Glucose, Bld 100 (H) 70 - 99 mg/dL   BUN 16 6 - 23 mg/dL   Creatinine, Ser 4.54 0.40 - 1.20 mg/dL   GFR 09.81 >19.14 mL/min   Calcium 9.8 8.4 - 10.5 mg/dL    Assessment & Plan:   Problem List Items Addressed This Visit     Essential hypertension - Primary    Significant improvement since starting spironolactone.  Further change regimen as per below - including stopping potassium supplementation.  Amlodipine 5mg  daily in am Losartan 50mg  daily at  night Spironolactone (Aldactone) whole tablet 25mg  daily in am Propranolol LA 60mg  daily in am She will return in 1 week for repeat labs and in 6 wks for f/u  OV.       Relevant Medications   amLODipine (NORVASC) 5 MG tablet   spironolactone (ALDACTONE) 25 MG tablet   losartan (COZAAR) 50 MG tablet   Other Relevant Orders   Basic metabolic panel   Chronic hypokalemia    Tolerating spironolactone 12.5mg  - will increase to 25mg  daily and stop potassium supplementation  Repeat BMP in 1 wk.       Chronic insomnia    Ongoing chronic insomnia due to tinnitus.         Meds ordered this encounter  Medications   amLODipine (NORVASC) 5 MG tablet    Sig: Take 1 tablet (5 mg total) by mouth daily.    Dispense:  90 tablet    Refill:  2   spironolactone (ALDACTONE) 25 MG tablet    Sig: Take 1 tablet (25 mg total) by mouth daily.    Dispense:  30 tablet    Refill:  6   losartan (COZAAR) 50 MG tablet    Sig: Take 1 tablet (50 mg total) by mouth daily.    Dispense:  90 tablet    Refill:  1    Note new dose    Orders Placed This Encounter  Procedures   Basic metabolic panel    Standing Status:   Future    Standing Expiration Date:   06/15/2024    Patient Instructions  Goal blood pressure: 120/70s.  New blood pressure regimen:  Amlodipine 5mg  daily in am Losartan 100mg  1/2 tablet (50mg ) daily at night Spironolactone (Aldactone) whole tablet 25mg  daily in am Propranolol LA 60mg  daily in am Schedule lab visit in 1 week to recheck potassium and kidneys Keep me updated with blood pressure readings.  Let me know if any questions.  Return in 6 weeks for follow up visit   Follow up plan: Return in about 6 weeks (around 07/28/2023), or if symptoms worsen or fail to improve, for follow up visit.  Eustaquio Boyden, MD

## 2023-06-16 NOTE — Assessment & Plan Note (Signed)
Significant improvement since starting spironolactone.  Further change regimen as per below - including stopping potassium supplementation.  Amlodipine 5mg  daily in am Losartan 50mg  daily at night Spironolactone (Aldactone) whole tablet 25mg  daily in am Propranolol LA 60mg  daily in am She will return in 1 week for repeat labs and in 6 wks for f/u OV.

## 2023-06-16 NOTE — Assessment & Plan Note (Signed)
Tolerating spironolactone 12.5mg  - will increase to 25mg  daily and stop potassium supplementation  Repeat BMP in 1 wk.

## 2023-06-24 ENCOUNTER — Other Ambulatory Visit (INDEPENDENT_AMBULATORY_CARE_PROVIDER_SITE_OTHER): Payer: Medicare Other

## 2023-06-24 DIAGNOSIS — I10 Essential (primary) hypertension: Secondary | ICD-10-CM | POA: Diagnosis not present

## 2023-06-24 LAB — BASIC METABOLIC PANEL
BUN: 14 mg/dL (ref 6–23)
CO2: 27 meq/L (ref 19–32)
Calcium: 10 mg/dL (ref 8.4–10.5)
Chloride: 106 meq/L (ref 96–112)
Creatinine, Ser: 0.84 mg/dL (ref 0.40–1.20)
GFR: 67.04 mL/min (ref >60.00)
Glucose, Bld: 98 mg/dL (ref 70–99)
Potassium: 4 meq/L (ref 3.5–5.1)
Sodium: 142 meq/L (ref 135–145)

## 2023-07-22 ENCOUNTER — Ambulatory Visit
Admission: RE | Admit: 2023-07-22 | Discharge: 2023-07-22 | Disposition: A | Discharge status: Home / Self Care | Payer: Medicare Other | Source: Ambulatory Visit | Attending: Family Medicine | Admitting: Family Medicine

## 2023-07-22 DIAGNOSIS — M810 Age-related osteoporosis without current pathological fracture: Secondary | ICD-10-CM | POA: Insufficient documentation

## 2023-07-22 DIAGNOSIS — Z1231 Encounter for screening mammogram for malignant neoplasm of breast: Secondary | ICD-10-CM | POA: Insufficient documentation

## 2023-07-22 DIAGNOSIS — Z780 Asymptomatic menopausal state: Secondary | ICD-10-CM | POA: Diagnosis not present

## 2023-07-28 ENCOUNTER — Ambulatory Visit (INDEPENDENT_AMBULATORY_CARE_PROVIDER_SITE_OTHER): Payer: Medicare Other | Admitting: Family Medicine

## 2023-07-28 ENCOUNTER — Encounter: Payer: Self-pay | Admitting: Family Medicine

## 2023-07-28 VITALS — BP 118/64 | HR 60 | Temp 97.8°F | Ht 60.25 in | Wt 128.1 lb

## 2023-07-28 DIAGNOSIS — M810 Age-related osteoporosis without current pathological fracture: Secondary | ICD-10-CM | POA: Diagnosis not present

## 2023-07-28 DIAGNOSIS — H9313 Tinnitus, bilateral: Secondary | ICD-10-CM | POA: Diagnosis not present

## 2023-07-28 DIAGNOSIS — H9193 Unspecified hearing loss, bilateral: Secondary | ICD-10-CM | POA: Diagnosis not present

## 2023-07-28 DIAGNOSIS — I10 Essential (primary) hypertension: Secondary | ICD-10-CM

## 2023-07-28 DIAGNOSIS — R519 Headache, unspecified: Secondary | ICD-10-CM

## 2023-07-28 MED ORDER — FAMOTIDINE 20 MG PO TABS: 20.0000 mg | ORAL_TABLET | Freq: Every day | ORAL | Status: AC | PRN

## 2023-07-28 NOTE — Assessment & Plan Note (Signed)
Continue hearing aids

## 2023-07-28 NOTE — Assessment & Plan Note (Addendum)
Chronic, stable on current regimen. Possible component of white coat hypertension as her office readings tend to run higher than at home. Continue current regimen. To let me know if develops hypotensive symptoms.

## 2023-07-28 NOTE — Assessment & Plan Note (Signed)
Ongoing issue - started after air-flight to [place].  Describes pounding sensation without pain, thought related to pulsatile tinnitus, s/p reassuring CTA head and neck 2020.  I'm not clear that she's had MRI.  She has seen ENT for this - will request records.

## 2023-07-28 NOTE — Patient Instructions (Addendum)
Change pepcid to as needed for heartburn.  Bone density scan shows worsening osteoporosis at the left hip.  We should consider starting bone strengthening medicines - but before you start medicines, check with your dentist about Fosamax vs Prolia.  Hearing screen today.  Blood pressures are doing well - continue current medicine.  Return in 3 months for follow up visit

## 2023-07-28 NOTE — Progress Notes (Signed)
Ph: [phone] Fax: [phone]   Patient ID: Robin Peck, female    DOB: 06-17-1946, 77 y.o.   MRN: [id number]  This visit was conducted in person.  BP 118/64 (BP Location: Right Arm, Cuff Size: Normal)   Pulse 60   Temp 97.8 F (36.6 C) (Oral)   Ht 5' 0.25" (1.53 m)   Wt 128 lb 2 oz (58.1 kg)   SpO2 98%   BMI 24.82 kg/m   BP Readings from Last 3 Encounters:  07/28/23 118/64  06/16/23 138/64  05/16/23 (!) 158/66   Hearing Screening   500Hz  1000Hz  2000Hz  4000Hz   Right ear 0 0 0 0  Left ear 0 0 0 0    CC: 6 wk f/u visit  Subjective:   HPI: Robin Peck is a 77 y.o. female presenting on 07/28/2023 for Medical Management of Chronic Issues (Here for 6 wk f/u.)   HTN - Compliant with current antihypertensive regimen of amlodipine 5mg  daily, losartan 50mg  daily, spironolactone 25mg  daily, propranolol SR (Inderal LA) 60mg  daily. Does check blood pressures at home and brings log - 100-120s/50-60s in am, 109-130s/50-60s in pm. No low blood pressure readings or symptoms of dizziness/syncope. Denies HA, vision changes, CP/tightness, SOB, leg swelling.   This morning BP 97/50.   Osteoporosis -  Recent DEXA 07/2023 - T -3.3 spine, -2.6 LFN, -2.8 L forearm Notes some left lateral hip pain if she's sedentary, pain improves with exercise (treadmill).  She continues weekly vitamin D3 50k units as well as dietary calcium intake.  No significant GERD - she limits spicy foods and tomatoes. She takes pepcid 20mg  nightly.  She does note she has upcoming dental work - will need to check with dentist. Currently on amoxicillin.   She is managing arthritis pain with Advil 400mg  BID PRN.   Notes ongoing head pounding to frontal head.  This may have started after flight to [place] 4 yrs ago, at the time associated with R hearing loss and tinnitus.  She had reassuring head CT 01/2021 as well as reassuring CTA head/neck 09/2018 done for pulsatile tinnitus s/p  ENT evaluation.   CTA head/neck IMPRESSION: 1. Atherosclerotic calcifications within the cavernous internal carotid arteries bilaterally without the significant stenosis or change. 2. No significant proximal stenosis, aneurysm, or branch vessel occlusion. 3. Normal CT appearance of the brain without and with contrast. 4. Multinodular goiter without a dominant lesion. 5. No acute or focal lesion in the right neck to explain her pain otherwise.  Notes certain medicines worsen symptoms - ie current amoxicillin antibiotic course.      Relevant past medical, surgical, family and social history reviewed and updated as indicated. Interim medical history since our last visit reviewed. Allergies and medications reviewed and updated. Outpatient Medications Prior to Visit  Medication Sig Dispense Refill   ALPRAZolam (XANAX) 0.5 MG tablet Take 1 tablet (0.5 mg total) by mouth at bedtime as needed for sleep. 30 tablet 0   amLODipine (NORVASC) 5 MG tablet Take 1 tablet (5 mg total) by mouth daily. 90 tablet 2   ascorbic acid (VITAMIN C) 500 MG tablet Take 500 mg by mouth daily in the afternoon.     aspirin 81 MG chewable tablet Chew 81 mg by mouth 2 (two) times daily.     atorvastatin (LIPITOR) 10 MG tablet Take 1 tablet (10 mg total) by mouth daily. 90 tablet 4   cetirizine (ZYRTEC) 10 MG tablet Take 1 tablet (10 mg total) by mouth daily as  needed (seasonal allergies.). (Patient taking differently: Take 10 mg by mouth in the morning.)     Cholecalciferol (VITAMIN D3) 1.25 MG (50000 UT) TABS Take 1 tablet by mouth once a week. 12 tablet 4   ibuprofen (ADVIL) 200 MG tablet Take 2 tablets (400 mg total) by mouth 2 (two) times daily as needed for moderate pain.     losartan (COZAAR) 50 MG tablet Take 1 tablet (50 mg total) by mouth daily. 90 tablet 1   OVER THE COUNTER MEDICATION Take 1 tablet by mouth daily in the afternoon. SUPER BEETS     propranolol ER (INDERAL LA) 60 MG 24 hr capsule Take 1 capsule (60 mg total) by  mouth daily. 90 capsule 4   spironolactone (ALDACTONE) 25 MG tablet Take 1 tablet (25 mg total) by mouth daily. 30 tablet 6   Zinc 30 MG CAPS Take 1 capsule (30 mg total) by mouth daily. (Patient taking differently: Take 1 capsule by mouth daily in the afternoon.) 30 capsule    famotidine (PEPCID) 20 MG tablet Take 1 tablet (20 mg total) by mouth at bedtime.     No facility-administered medications prior to visit.     Per HPI unless specifically indicated in ROS section below Review of Systems  Objective:  BP 118/64 (BP Location: Right Arm, Cuff Size: Normal)   Pulse 60   Temp 97.8 F (36.6 C) (Oral)   Ht 5' 0.25" (1.53 m)   Wt 128 lb 2 oz (58.1 kg)   SpO2 98%   BMI 24.82 kg/m   Wt Readings from Last 3 Encounters:  07/28/23 128 lb 2 oz (58.1 kg)  06/16/23 128 lb 2 oz (58.1 kg)  05/16/23 124 lb 6 oz (56.4 kg)      Physical Exam Vitals and nursing note reviewed.  Constitutional:      Appearance: Normal appearance. She is not ill-appearing.  HENT:     Mouth/Throat:     Mouth: Mucous membranes are moist.     Pharynx: Oropharynx is clear. No oropharyngeal exudate or posterior oropharyngeal erythema.  Eyes:     Extraocular Movements: Extraocular movements intact.     Conjunctiva/sclera: Conjunctivae normal.     Pupils: Pupils are equal, round, and reactive to light.     Comments: s/p bilat cataract extraction  Cardiovascular:     Rate and Rhythm: Normal rate and regular rhythm.     Pulses: Normal pulses.     Heart sounds: Normal heart sounds. No murmur heard. Pulmonary:     Effort: Pulmonary effort is normal. No respiratory distress.     Breath sounds: Normal breath sounds. No wheezing, rhonchi or rales.  Musculoskeletal:     Right lower leg: No edema.     Left lower leg: No edema.  Skin:    General: Skin is warm and dry.     Findings: No rash.  Neurological:     Mental Status: She is alert.     Comments:  Normal rinne and weber tests  Psychiatric:        Mood and  Affect: Mood normal.        Behavior: Behavior normal.       Results for orders placed or performed in visit on 06/24/23  Basic metabolic panel  Result Value Ref Range   Sodium 142 135 - 145 mEq/L   Potassium 4.0 3.5 - 5.1 mEq/L   Chloride 106 96 - 112 mEq/L   CO2 27 19 - 32 mEq/L   Glucose,  Bld 98 70 - 99 mg/dL   BUN 14 6 - 23 mg/dL   Creatinine, Ser 7.82 0.40 - 1.20 mg/dL   GFR 95.62 >13.08 mL/min   Calcium 10.0 8.4 - 10.5 mg/dL    Assessment & Plan:   Problem List Items Addressed This Visit     Essential hypertension - Primary    Chronic, stable on current regimen. Possible component of white coat hypertension as her office readings tend to run higher than at home. Continue current regimen. To let me know if develops hypotensive symptoms.       Tinnitus aurium, bilateral    Chronic issue, has seen ENT for this, wears hearing aides.       Pounding in head    Ongoing issue - started after air-flight to [place].  Describes pounding sensation without pain, thought related to pulsatile tinnitus, s/p reassuring CTA head and neck 2020.  I'm not clear that she's had MRI.  She has seen ENT for this - will request records.      Osteoporosis    Reviewed latest DEXA scan showing persistent osteoporosis, worse at spine.  Continue dietary calcium, vit D  supplement, and regular weight bearing exercise.  We discussed medications for bone strengthening including oral bisphosphonate or injection Prolia, as well as risks of atypical hip fractures and jaw issues.  Given she has upcoming dental work planned over the next several months, will defer starting these medications at this time.  I did ask her to get dentists recommendation on these medications.      Bilateral hearing loss    Continue hearing aids        Meds ordered this encounter  Medications   famotidine (PEPCID) 20 MG tablet    Sig: Take 1 tablet (20 mg total) by mouth daily as needed for heartburn or indigestion.     No orders of the defined types were placed in this encounter.   Patient Instructions  Change pepcid to as needed for heartburn.  Bone density scan shows worsening osteoporosis at the left hip.  We should consider starting bone strengthening medicines - but before you start medicines, check with your dentist about Fosamax vs Prolia.  Hearing screen today.  Blood pressures are doing well - continue current medicine.  Return in 3 months for follow up visit   Follow up plan: Return in about 3 months (around 10/28/2023).  Eustaquio Boyden, MD

## 2023-07-28 NOTE — Assessment & Plan Note (Signed)
Reviewed latest DEXA scan showing persistent osteoporosis, worse at spine.  Continue dietary calcium, vit D  supplement, and regular weight bearing exercise.  We discussed medications for bone strengthening including oral bisphosphonate or injection Prolia, as well as risks of atypical hip fractures and jaw issues.  Given she has upcoming dental work planned over the next several months, will defer starting these medications at this time.  I did ask her to get dentists recommendation on these medications.

## 2023-07-28 NOTE — Assessment & Plan Note (Addendum)
Chronic issue, has seen ENT for this, wears hearing aides.

## 2023-09-07 ENCOUNTER — Other Ambulatory Visit: Payer: Self-pay | Admitting: Family Medicine

## 2023-09-07 DIAGNOSIS — F5104 Psychophysiologic insomnia: Secondary | ICD-10-CM

## 2023-09-08 NOTE — Telephone Encounter (Signed)
 Refill request for ALPRAZolam 0.5 MG TABLET   LOV - 07/28/23 Next OV - 10/15/23 Last refill - 04/14/23 #30/0

## 2023-09-09 DIAGNOSIS — Z23 Encounter for immunization: Secondary | ICD-10-CM | POA: Diagnosis not present

## 2023-09-09 NOTE — Telephone Encounter (Signed)
 ERx

## 2023-10-15 ENCOUNTER — Encounter: Payer: Self-pay | Admitting: Family Medicine

## 2023-10-15 ENCOUNTER — Ambulatory Visit (INDEPENDENT_AMBULATORY_CARE_PROVIDER_SITE_OTHER): Payer: Medicare Other | Admitting: Family Medicine

## 2023-10-15 VITALS — BP 134/62 | HR 59 | Temp 97.7°F | Ht 60.25 in | Wt 129.2 lb

## 2023-10-15 DIAGNOSIS — F5104 Psychophysiologic insomnia: Secondary | ICD-10-CM | POA: Diagnosis not present

## 2023-10-15 DIAGNOSIS — I10 Essential (primary) hypertension: Secondary | ICD-10-CM | POA: Diagnosis not present

## 2023-10-15 DIAGNOSIS — M810 Age-related osteoporosis without current pathological fracture: Secondary | ICD-10-CM | POA: Diagnosis not present

## 2023-10-15 DIAGNOSIS — H9313 Tinnitus, bilateral: Secondary | ICD-10-CM

## 2023-10-15 MED ORDER — SPIRONOLACTONE 25 MG PO TABS: 25.0000 mg | ORAL_TABLET | Freq: Every day | ORAL | 1 refills | Status: DC

## 2023-10-15 NOTE — Progress Notes (Signed)
Ph: [phone] Fax: [phone]   Patient ID: Robin Peck, female    DOB: [birth year], 78 y.o.   MRN: [id number]  This visit was conducted in person.  BP 134/62   Pulse (!) 59   Temp 97.7 F (36.5 C) (Oral)   Ht 5' 0.25" (1.53 m)   Wt 129 lb 4 oz (58.6 kg)   SpO2 99%   BMI 25.03 kg/m    CC: 3 mo HTN f/u visit  Subjective:   HPI: Robin Peck is a 78 y.o. female presenting on 10/15/2023 for Medical Management of Chronic Issues (Here for 6 mo f/u. Also, requests 90-day rx for spironolactone. )   She has been feeling well.  She did not go to [place] last year. Decided to complete dental work first. 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  Takes OTC NSAID advil PRN joint pains.   HTN - Compliant with current antihypertensive regimen of amlodipine 5mg  daily, losartan 50mg  daily, propranolol (Inderal LA) 60mg  daily and spironolactone 25mg  daily.  Does check blood pressures at home: this am 129/65. Last week down to 107/49 - felt sluggish but no dizziness/syncope.  Denies vision changes, CP/tightness, SOB, leg swelling.   HA - resolved.  Ongoing chronic tinnitus.   Osteoporosis - DEXA 07/2023 - T -3.3 spine, -2.6 LFN, -2.8 L forearm She checked with dentist re prolia vs fosamax - rec waiting until dental work completed first.  She continues weekly vit D 50k units. Discussed dietary calcium.      Relevant past medical, surgical, family and social history reviewed and updated as indicated. Interim medical history since our last visit reviewed. Allergies and medications reviewed and updated. Outpatient Medications Prior to Visit  Medication Sig Dispense Refill   ALPRAZolam (XANAX) 0.5 MG tablet TAKE 1 TABLET BY MOUTH EVERY NIGHT AT BEDTIME AS NEEDED FOR SLEEP 30 tablet 0   amLODipine (NORVASC) 5 MG tablet Take 1 tablet (5 mg total) by mouth daily. 90 tablet 2   ascorbic acid (VITAMIN C) 500 MG tablet Take 500 mg by mouth  daily in the afternoon.     aspirin 81 MG chewable tablet Chew 81 mg by mouth 2 (two) times daily.     atorvastatin (LIPITOR) 10 MG tablet Take 1 tablet (10 mg total) by mouth daily. 90 tablet 4   cetirizine (ZYRTEC) 10 MG tablet Take 1 tablet (10 mg total) by mouth daily as needed (seasonal allergies.). (Patient taking differently: Take 10 mg by mouth in the morning.)     Cholecalciferol (VITAMIN D3) 1.25 MG (50000 UT) TABS Take 1 tablet by mouth once a week. 12 tablet 4   famotidine (PEPCID) 20 MG tablet Take 1 tablet (20 mg total) by mouth daily as needed for heartburn or indigestion.     ibuprofen (ADVIL) 200 MG tablet Take 2 tablets (400 mg total) by mouth 2 (two) times daily as needed for moderate pain.     losartan (COZAAR) 50 MG tablet Take 1 tablet (50 mg total) by mouth daily. 90 tablet 1   OVER THE COUNTER MEDICATION Take 1 tablet by mouth daily in the afternoon. SUPER BEETS     propranolol ER (INDERAL LA) 60 MG 24 hr capsule Take 1 capsule (60 mg total) by mouth daily. 90 capsule 4   Zinc 30 MG CAPS Take 1 capsule (30 mg total) by mouth daily. (Patient taking differently: Take 1 capsule by mouth daily in the afternoon.) 30 capsule  spironolactone (ALDACTONE) 25 MG tablet Take 1 tablet (25 mg total) by mouth daily. 30 tablet 6   No facility-administered medications prior to visit.     Per HPI unless specifically indicated in ROS section below Review of Systems  Objective:  BP 134/62   Pulse (!) 59   Temp 97.7 F (36.5 C) (Oral)   Ht 5' 0.25" (1.53 m)   Wt 129 lb 4 oz (58.6 kg)   SpO2 99%   BMI 25.03 kg/m   Wt Readings from Last 3 Encounters:  10/15/23 129 lb 4 oz (58.6 kg)  07/28/23 128 lb 2 oz (58.1 kg)  06/16/23 128 lb 2 oz (58.1 kg)      Physical Exam Vitals and nursing note reviewed.  Constitutional:      Appearance: Normal appearance. She is not ill-appearing.  HENT:     Head: Normocephalic and atraumatic.     Mouth/Throat:     Mouth: Mucous membranes are  moist.     Pharynx: Oropharynx is clear. No oropharyngeal exudate or posterior oropharyngeal erythema.  Eyes:     Extraocular Movements: Extraocular movements intact.     Conjunctiva/sclera: Conjunctivae normal.     Pupils: Pupils are equal, round, and reactive to light.  Cardiovascular:     Rate and Rhythm: Normal rate and regular rhythm.     Pulses: Normal pulses.     Heart sounds: Normal heart sounds. No murmur heard. Pulmonary:     Effort: Pulmonary effort is normal. No respiratory distress.     Breath sounds: Normal breath sounds. No wheezing, rhonchi or rales.  Musculoskeletal:     Right lower leg: No edema.     Left lower leg: No edema.  Skin:    General: Skin is warm and dry.     Findings: No rash.  Neurological:     Mental Status: She is alert.  Psychiatric:        Mood and Affect: Mood normal.        Behavior: Behavior normal.       Results for orders placed or performed in visit on 06/24/23  Basic metabolic panel   Collection Time: 06/24/23  7:46 AM  Result Value Ref Range   Sodium 142 135 - 145 mEq/L   Potassium 4.0 3.5 - 5.1 mEq/L   Chloride 106 96 - 112 mEq/L   CO2 27 19 - 32 mEq/L   Glucose, Bld 98 70 - 99 mg/dL   BUN 14 6 - 23 mg/dL   Creatinine, Ser 3.08 0.40 - 1.20 mg/dL   GFR 65.78 >46.96 mL/min   Calcium 10.0 8.4 - 10.5 mg/dL    Assessment & Plan:   Problem List Items Addressed This Visit     Essential hypertension - Primary   Chronic, stable on current regimen - continue.       Relevant Medications   spironolactone (ALDACTONE) 25 MG tablet   Tinnitus aurium, bilateral   Ongoing, chronic, has seen ENT. Wears hearing aides.      Chronic insomnia   Provided with sleep hygiene checklist.       Osteoporosis   Dentist has recommended she complete dental implant prior to starting osteoporosis medication (planned summer 2025). Continue regular weight bearing exercise and calcium in diet. Info provided on dietary calcium. Continue vit D 50k  units weekly.         Meds ordered this encounter  Medications   spironolactone (ALDACTONE) 25 MG tablet    Sig: Take 1 tablet (25  mg total) by mouth daily.    Dispense:  90 tablet    Refill:  1    No orders of the defined types were placed in this encounter.   Patient Instructions  Continue blood pressure medicines.  Let me know if you develop recurrent low blood pressures or symptoms of low blood pressure.  Return in 6 months for physical  For osteoporosis, recommend 1200mg  of calcium/day.  Try to get most or all of your calcium from your food.  To figure out dietary calcium: 300 mg/day from all non dairy foods plus 300 mg per cup of milk, other dairy, or fortified juice.  Non dairy foods that contain calcium: Kale, oranges, sardines, oatmeal, soy milk/soybeans, salmon, white beans, dried figs, turnip greens, almonds, broccoli, tofu.   Bedtime routine checklist: 1. Avoid naps during the day 2. Avoid stimulants such as caffeine and nicotine. Avoid bedtime alcohol (it can speed onset of sleep but the body's metabolism can cause awakenings). 3. All forms of exercise help ensure sound sleep - limit vigorous exercise to morning or late afternoon 4. Avoid food too close to bedtime including chocolate (which contains caffeine) 5. Soak up natural light 6. Establish regular bedtime routine. 7. Associate bed with sleep - avoid TV, computer or phone, reading while in bed. 8. Ensure pleasant, relaxing sleep environment - quiet, dark, cool room.   Follow up plan: Return in about 6 months (around 04/13/2024) for annual exam, prior fasting for blood work.  Eustaquio Boyden, MD

## 2023-10-15 NOTE — Assessment & Plan Note (Signed)
Provided with sleep hygiene checklist.

## 2023-10-15 NOTE — Assessment & Plan Note (Signed)
Ongoing, chronic, has seen ENT. Wears hearing aides.

## 2023-10-15 NOTE — Assessment & Plan Note (Signed)
Dentist has recommended she complete dental implant prior to starting osteoporosis medication (planned summer 2025). Continue regular weight bearing exercise and calcium in diet. Info provided on dietary calcium. Continue vit D 50k units weekly.

## 2023-10-15 NOTE — Assessment & Plan Note (Signed)
Chronic, stable on current regimen - continue.

## 2023-10-15 NOTE — Patient Instructions (Addendum)
Continue blood pressure medicines.  Let me know if you develop recurrent low blood pressures or symptoms of low blood pressure.  Return in 6 months for physical  For osteoporosis, recommend 1200mg  of calcium/day.  Try to get most or all of your calcium from your food.  To figure out dietary calcium: 300 mg/day from all non dairy foods plus 300 mg per cup of milk, other dairy, or fortified juice.  Non dairy foods that contain calcium: Kale, oranges, sardines, oatmeal, soy milk/soybeans, salmon, white beans, dried figs, turnip greens, almonds, broccoli, tofu.   Bedtime routine checklist: 1. Avoid naps during the day 2. Avoid stimulants such as caffeine and nicotine. Avoid bedtime alcohol (it can speed onset of sleep but the body's metabolism can cause awakenings). 3. All forms of exercise help ensure sound sleep - limit vigorous exercise to morning or late afternoon 4. Avoid food too close to bedtime including chocolate (which contains caffeine) 5. Soak up natural light 6. Establish regular bedtime routine. 7. Associate bed with sleep - avoid TV, computer or phone, reading while in bed. 8. Ensure pleasant, relaxing sleep environment - quiet, dark, cool room.

## 2023-12-03 ENCOUNTER — Other Ambulatory Visit: Payer: Self-pay | Admitting: Family Medicine

## 2023-12-03 DIAGNOSIS — I10 Essential (primary) hypertension: Secondary | ICD-10-CM

## 2023-12-03 DIAGNOSIS — F5104 Psychophysiologic insomnia: Secondary | ICD-10-CM

## 2023-12-04 NOTE — Telephone Encounter (Signed)
 ERx

## 2024-03-08 ENCOUNTER — Other Ambulatory Visit: Payer: Self-pay | Admitting: Family Medicine

## 2024-03-08 DIAGNOSIS — I10 Essential (primary) hypertension: Secondary | ICD-10-CM

## 2024-03-29 ENCOUNTER — Ambulatory Visit: Payer: Medicare Other

## 2024-03-29 VITALS — BP 134/62 | Ht 60.0 in | Wt 125.0 lb

## 2024-03-29 DIAGNOSIS — Z2821 Immunization not carried out because of patient refusal: Secondary | ICD-10-CM

## 2024-03-29 DIAGNOSIS — Z0000 Encounter for general adult medical examination without abnormal findings: Secondary | ICD-10-CM

## 2024-03-29 NOTE — Patient Instructions (Signed)
 Robin Peck , Thank you for taking time out of your busy schedule to complete your Annual Wellness Visit with me. I enjoyed our conversation and look forward to speaking with you again next year. I, as well as your care team,  appreciate your ongoing commitment to your health goals. Please review the following plan we discussed and let me know if I can assist you in the future. Your Game plan/ To Do List    Referrals: If you haven't heard from the office you've been referred to, please reach out to them at the phone provided.  none Follow up Visits: Next Medicare AWV with our clinical staff: 03/30/2025   Have you seen your provider in the last 6 months (3 months if uncontrolled diabetes)? No Next Office Visit with your provider: 04/14/2024  Clinician Recommendations:  Aim for 30 minutes of exercise or brisk walking, 6-8 glasses of water, and 5 servings of fruits and vegetables each day.       This is a list of the screening recommended for you and due dates:  Health Maintenance  Topic Date Due   COVID-19 Vaccine (1 - 2024-25 season) Never done   Flu Shot  04/02/2024   Mammogram  07/21/2024   Medicare Annual Wellness Visit  03/29/2025   DTaP/Tdap/Td vaccine (2 - Tdap) 01/22/2026   Pneumococcal Vaccine for age over 27  Completed   DEXA scan (bone density measurement)  Completed   Hepatitis C Screening  Completed   Zoster (Shingles) Vaccine  Completed   Hepatitis B Vaccine  Aged Out   HPV Vaccine  Aged Out   Meningitis B Vaccine  Aged Out   Colon Cancer Screening  Discontinued   Cologuard (Stool DNA test)  Discontinued    Advanced directives: (In Chart) A copy of your advanced directives are scanned into your chart should your provider ever need it. Advance Care Planning is important because it:  [x]  Makes sure you receive the medical care that is consistent with your values, goals, and preferences  [x]  It provides guidance to your family and loved ones and reduces their decisional  burden about whether or not they are making the right decisions based on your wishes.  Follow the link provided in your after visit summary or read over the paperwork we have mailed to you to help you started getting your Advance Directives in place. If you need assistance in completing these, please reach out to us  so that we can help you!  See attachments for Preventive Care and Fall Prevention Tips.

## 2024-03-29 NOTE — Progress Notes (Signed)
 Because this visit was a virtual/telehealth visit,  certain criteria was not obtained, such a blood pressure, CBG if applicable, and timed get up and go. Any medications not marked as taking were not mentioned during the medication reconciliation part of the visit. Any vitals not documented were not able to be obtained due to this being a telehealth visit or patient was unable to self-report a recent blood pressure reading due to a lack of equipment at home via telehealth. Vitals that have been documented are verbally provided by the patient.   This visit was performed by a medical professional under my direct supervision. I was immediately available for consultation/collaboration. I have reviewed and agree with the Annual Wellness Visit documentation.  Subjective:   Robin Peck is a 78 y.o. who presents for a Medicare Wellness preventive visit.  As a reminder, Annual Wellness Visits don't include a physical exam, and some assessments may be limited, especially if this visit is performed virtually. We may recommend an in-person follow-up visit with your provider if needed.  Visit Complete: Virtual I connected with  Robin Peck on 03/29/24 by a audio enabled telemedicine application and verified that I am speaking with the correct person using two identifiers.  Patient Location: Home  Provider Location: Home Office  I discussed the limitations of evaluation and management by telemedicine. The patient expressed understanding and agreed to proceed.  Vital Signs: Because this visit was a virtual/telehealth visit, some criteria may be missing or patient reported. Any vitals not documented were not able to be obtained and vitals that have been documented are patient reported.  VideoDeclined- This patient declined Librarian, academic. Therefore the visit was completed with audio only.  Persons Participating in Visit: Patient.  AWV Questionnaire: No: Patient  Medicare AWV questionnaire was not completed prior to this visit.  Cardiac Risk Factors include: advanced age (>77men, >61 women);hypertension;dyslipidemia     Objective:    Today's Vitals   03/29/24 1351  BP: 134/62  Weight: 125 lb (56.7 kg)  Height: 5' (1.524 m)   Body mass index is 24.41 kg/m.     03/29/2024    1:50 PM 03/26/2023    9:59 AM 07/18/2022    8:47 AM 03/22/2022    3:09 PM 03/15/2021    8:31 AM 06/12/2020   10:02 AM 09/21/2019   11:49 PM  Advanced Directives  Does Patient Have a Medical Advance Directive? Yes Yes Yes Yes Yes No;Yes No  Type of Estate agent of [place];Living will Healthcare Power of [place];Living will Healthcare Power of [place];Living will Healthcare Power of [place];Living will Healthcare Power of [place];Living will    Does patient want to make changes to medical advance directive? No - Patient declined   No - Patient declined     Copy of Healthcare Power of Attorney in Chart? Yes - validated most recent copy scanned in chart (See row information) No - copy requested No - copy requested No - copy requested No - copy requested    Would patient like information on creating a medical advance directive?       No - Patient declined    Current Medications (verified) Outpatient Encounter Medications as of 03/29/2024  Medication Sig   ALPRAZolam  (XANAX ) 0.5 MG tablet TAKE 1 TABLET BY MOUTH EVERY NIGHT AT BEDTIME AS NEEDED FOR SLEEP   amLODipine  (NORVASC ) 5 MG tablet TAKE 1 TABLET BY MOUTH DAILY   ascorbic acid  (VITAMIN C) 500 MG tablet Take 500 mg  by mouth daily in the afternoon.   aspirin  81 MG chewable tablet Chew 81 mg by mouth 2 (two) times daily.   atorvastatin  (LIPITOR ) 10 MG tablet Take 1 tablet (10 mg total) by mouth daily.   cetirizine  (ZYRTEC ) 10 MG tablet Take 1 tablet (10 mg total) by mouth daily as needed (seasonal allergies.). (Patient taking differently: Take 10 mg by mouth in the morning.)   Cholecalciferol  (VITAMIN D3) 1.25 MG (50000 UT) TABS Take 1 tablet by mouth once a week.   famotidine  (PEPCID ) 20 MG tablet Take 1 tablet (20 mg total) by mouth daily as needed for heartburn or indigestion.   ibuprofen (ADVIL) 200 MG tablet Take 2 tablets (400 mg total) by mouth 2 (two) times daily as needed for moderate pain.   losartan  (COZAAR ) 50 MG tablet TAKE 1 TABLET BY MOUTH DAILY   OVER THE COUNTER MEDICATION Take 1 tablet by mouth daily in the afternoon. SUPER BEETS   propranolol  ER (INDERAL  LA) 60 MG 24 hr capsule Take 1 capsule (60 mg total) by mouth daily.   spironolactone  (ALDACTONE ) 25 MG tablet Take 1 tablet (25 mg total) by mouth daily.   Zinc  30 MG CAPS Take 1 capsule (30 mg total) by mouth daily. (Patient taking differently: Take 1 capsule by mouth daily in the afternoon.)   No facility-administered encounter medications on file as of 03/29/2024.    Allergies (verified) Valacyclovir  hcl, Gabapentin , Hydrochlorothiazide , and Lisinopril   History: Past Medical History:  Diagnosis Date   Allergic rhinitis    Allergy    seasonal   Anxiety    Arthritis    COVID-19 virus infection 02/02/2021   Heart murmur    Hyperlipidemia    Hypertension    Past Surgical History:  Procedure Laterality Date   ABDOMINAL HYSTERECTOMY     CATARACT EXTRACTION, BILATERAL Bilateral 2023   summer   COLONOSCOPY  07/2022   benign polyp, rpt 3 yrs ([facility])   COLONOSCOPY WITH PROPOFOL  N/A 06/12/2020   3.5cm lesion removed piecemeal, smaller polyp removed, int hem ([facility])   COLONOSCOPY WITH PROPOFOL  N/A 07/18/2022   polyp - benign, hemorrhoids, rpt 3 yrs given large adenoma hx ([facility], Aloha Raddle., MD)   ENDOSCOPIC MUCOSAL RESECTION N/A 06/12/2020   Procedure: ENDOSCOPIC MUCOSAL RESECTION;  Surgeon: Wilhelmenia Aloha Raddle., MD;  Location[facility] ENDOSCOPY;  Service: Gastroenterology;  Laterality: N/A;   HEMOSTASIS CLIP PLACEMENT  06/12/2020   Procedure: HEMOSTASIS CLIP PLACEMENT;  Surgeon:  Wilhelmenia Aloha Raddle., MD;  Location[facility] ENDOSCOPY;  Service: Gastroenterology;;   PARTIAL HYSTERECTOMY  2002   hysterectomy   POLYPECTOMY  06/12/2020   Procedure: POLYPECTOMY;  Surgeon: Wilhelmenia Aloha Raddle., MD;  Location: [facility] ENDOSCOPY;  Service: Gastroenterology;;   POLYPECTOMY  07/18/2022   Procedure: POLYPECTOMY;  Surgeon: Wilhelmenia Aloha Raddle., MD;  Location: [facility] ENDOSCOPY;  Service: Gastroenterology;;   SUBMUCOSAL LIFTING INJECTION  06/12/2020   Procedure: SUBMUCOSAL LIFTING INJECTION;  Surgeon: Wilhelmenia Aloha Raddle., MD;  Location: [facility] ENDOSCOPY;  Service: Gastroenterology;;   Family History  Problem Relation Age of Onset   Hypertension Mother    Stroke Mother    Hypertension Father    Hypertension Sister    Heart attack Brother    Hypertension Brother    Heart attack Maternal Grandfather    Colon cancer Neg Hx    Stomach cancer Neg Hx    Esophageal cancer Neg Hx    Pancreatic cancer Neg Hx    Liver cancer Neg Hx    Inflammatory bowel  disease Neg Hx    Rectal cancer Neg Hx    Colon polyps Neg Hx    Social History   Socioeconomic History   Marital status: Married    Spouse name: Not on file   Number of children: 1   Years of education: Not on file   Highest education level: Not on file  Occupational History   Not on file  Tobacco Use   Smoking status: Never   Smokeless tobacco: Never  Vaping Use   Vaping status: Never Used  Substance and Sexual Activity   Alcohol use: Never   Drug use: No   Sexual activity: Not on file  Other Topics Concern   Not on file  Social History Narrative   Lives with husband Robin Peck. No children.    Social Drivers of Corporate investment banker Strain: Low Risk  (03/29/2024)   Overall Financial Resource Strain (CARDIA)    Difficulty of Paying Living Expenses: Not hard at all  Food Insecurity: No Food Insecurity (03/29/2024)   Hunger Vital Sign    Worried About Running Out of Food in the Last Year: Never true    Ran Out of  Food in the Last Year: Never true  Transportation Needs: No Transportation Needs (03/29/2024)   PRAPARE - Administrator, Civil Service (Medical): No    Lack of Transportation (Non-Medical): No  Physical Activity: Sufficiently Active (03/29/2024)   Exercise Vital Sign    Days of Exercise per Week: 5 days    Minutes of Exercise per Session: 30 min  Stress: No Stress Concern Present (03/29/2024)   [employer] of Occupational Health - Occupational Stress Questionnaire    Feeling of Stress: Not at all  Social Connections: Moderately Integrated (03/29/2024)   Social Connection and Isolation Panel    Frequency of Communication with Friends and Family: More than three times a week    Frequency of Social Gatherings with Friends and Family: More than three times a week    Attends Religious Services: More than 4 times per year    Active Member of [employer] or Organizations: No    Attends Banker Meetings: Never    Marital Status: Married    Tobacco Counseling Counseling given: Not Answered    Clinical Intake:  Pre-visit preparation completed: Yes  Pain : No/denies pain     BMI - recorded: 24.41 Nutritional Status: BMI of 19-24  Normal Nutritional Risks: None Diabetes: No  Lab Results  Component Value Date   HGBA1C 6.1 12/15/2019     How often do you need to have someone help you when you read instructions, pamphlets, or other written materials from your doctor or pharmacy?: 1 - Never  Interpreter Needed?: No  Information entered by :: Nishawn Rotan,cma   Activities of Daily Living     03/29/2024    1:54 PM  In your present state of health, do you have any difficulty performing the following activities:  Hearing? 0  Vision? 0  Difficulty concentrating or making decisions? 0  Walking or climbing stairs? 0  Dressing or bathing? 0  Doing errands, shopping? 0  Preparing Food and eating ? N  Using the Toilet? N  In the past six months, have you  accidently leaked urine? N  Do you have problems with loss of bowel control? N  Managing your Medications? N  Managing your Finances? N  Housekeeping or managing your Housekeeping? N    Patient Care Team: Rilla Baller, MD  as PCP - General (Family Medicine) Fate Morna SAILOR, [facility] (Inactive) as Pharmacist (Pharmacist)  I have updated your Care Teams any recent Medical Services you may have received from other providers in the past year.     Assessment:   This is a routine wellness examination for Robin Peck.  Hearing/Vision screen Hearing Screening - Comments:: Patient has hearing aids  Vision Screening - Comments:: Patient wears glasses   Goals Addressed             This Visit's Progress    Patient Stated       Would like to visit [place]       Depression Screen     03/29/2024    1:55 PM 10/15/2023   10:57 AM 07/28/2023    9:30 AM 06/16/2023   10:53 AM 05/16/2023    1:03 PM 04/14/2023    9:00 AM 03/26/2023    9:57 AM  PHQ 2/9 Scores  PHQ - 2 Score 0 0 0 0 0 0 0  PHQ- 9 Score 2 4 3 3 1 4      Fall Risk     03/29/2024    1:53 PM 10/15/2023   10:57 AM 07/28/2023    9:29 AM 04/14/2023    9:00 AM 03/26/2023   10:02 AM  Fall Risk   Falls in the past year? 0 0 0 0 0  Number falls in past yr: 0    0  Injury with Fall? 0    0  Risk for fall due to : No Fall Risks    No Fall Risks  Follow up Falls evaluation completed    Falls prevention discussed;Falls evaluation completed    MEDICARE RISK AT HOME:  Medicare Risk at Home Any stairs in or around the home?: No If so, are there any without handrails?: No Home free of loose throw rugs in walkways, pet beds, electrical cords, etc?: Yes Adequate lighting in your home to reduce risk of falls?: Yes Life alert?: No Use of a cane, walker or w/c?: No Grab bars in the bathroom?: Yes Shower chair or bench in shower?: Yes Elevated toilet seat or a handicapped toilet?: Yes  TIMED UP AND GO:  Was the test performed?   No  Cognitive Function: 6CIT completed    03/15/2021    8:45 AM  MMSE - Mini Mental State Exam  Orientation to time 5  Orientation to Place 5  Registration 3  Attention/ Calculation 5  Recall 3  Language- repeat 1        03/26/2023   10:04 AM 03/22/2022    3:10 PM  6CIT Screen  What Year? 0 points 0 points  What month? 0 points 0 points  What time? 0 points 0 points  Count back from 20 0 points 0 points  Months in reverse 0 points 0 points  Repeat phrase 0 points 2 points  Total Score 0 points 2 points    Immunizations Immunization History  Administered Date(s) Administered   Fluad Quad(high Dose 65+) 07/13/2022   Fluad Trivalent(High Dose 65+) 09/09/2023   Influenza, High Dose Seasonal PF 08/30/2020, 07/18/2021   Pneumococcal Conjugate-13 02/14/2016   Pneumococcal Polysaccharide-23 04/09/2022   Td 01/23/2016   Zoster Recombinant(Shingrix) 10/13/2019, 01/15/2020    Screening Tests Health Maintenance  Topic Date Due   COVID-19 Vaccine (1 - 2024-25 season) Never done   INFLUENZA VACCINE  04/02/2024   MAMMOGRAM  07/21/2024   Medicare Annual Wellness (AWV)  03/29/2025   DTaP/Tdap/Td (2 -  Tdap) 01/22/2026   Pneumococcal Vaccine: 50+ Years  Completed   DEXA SCAN  Completed   Hepatitis C Screening  Completed   Zoster Vaccines- Shingrix  Completed   Hepatitis B Vaccines  Aged Out   HPV VACCINES  Aged Out   Meningococcal B Vaccine  Aged Out   Colonoscopy  Discontinued   Fecal DNA (Cologuard)  Discontinued    Health Maintenance  Health Maintenance Due  Topic Date Due   COVID-19 Vaccine (1 - 2024-25 season) Never done   Health Maintenance Items Addressed:   Additional Screening:  Vision Screening: Recommended annual ophthalmology exams for early detection of glaucoma and other disorders of the eye. Would you like a referral to an eye doctor? No    Dental Screening: Recommended annual dental exams for proper oral hygiene  Community Resource Referral /  Chronic Care Management: CRR required this visit?  No   CCM required this visit?  No   Plan:    I have personally reviewed and noted the following in the patient's chart:   Medical and social history Use of alcohol, tobacco or illicit drugs  Current medications and supplements including opioid prescriptions. Patient is not currently taking opioid prescriptions. Functional ability and status Nutritional status Physical activity Advanced directives List of other physicians Hospitalizations, surgeries, and ER visits in previous 12 months Vitals Screenings to include cognitive, depression, and falls Referrals and appointments  In addition, I have reviewed and discussed with patient certain preventive protocols, quality metrics, and best practice recommendations. A written personalized care plan for preventive services as well as general preventive health recommendations were provided to patient.   Lyle MARLA Right, [place]   03/29/2024   After Visit Summary: (MyChart) Due to this being a telephonic visit, the after visit summary with patients personalized plan was offered to patient via MyChart   Notes: Nothing significant to report at this time.

## 2024-04-04 ENCOUNTER — Other Ambulatory Visit: Payer: Self-pay | Admitting: Family Medicine

## 2024-04-04 DIAGNOSIS — E7800 Pure hypercholesterolemia, unspecified: Secondary | ICD-10-CM

## 2024-04-04 DIAGNOSIS — E871 Hypo-osmolality and hyponatremia: Secondary | ICD-10-CM

## 2024-04-04 DIAGNOSIS — E559 Vitamin D deficiency, unspecified: Secondary | ICD-10-CM

## 2024-04-04 DIAGNOSIS — R7989 Other specified abnormal findings of blood chemistry: Secondary | ICD-10-CM

## 2024-04-07 ENCOUNTER — Other Ambulatory Visit (INDEPENDENT_AMBULATORY_CARE_PROVIDER_SITE_OTHER): Payer: Medicare Other

## 2024-04-07 DIAGNOSIS — E7800 Pure hypercholesterolemia, unspecified: Secondary | ICD-10-CM | POA: Diagnosis not present

## 2024-04-07 DIAGNOSIS — R7989 Other specified abnormal findings of blood chemistry: Secondary | ICD-10-CM | POA: Diagnosis not present

## 2024-04-07 DIAGNOSIS — E559 Vitamin D deficiency, unspecified: Secondary | ICD-10-CM | POA: Diagnosis not present

## 2024-04-07 DIAGNOSIS — E871 Hypo-osmolality and hyponatremia: Secondary | ICD-10-CM | POA: Diagnosis not present

## 2024-04-07 LAB — LIPID PANEL
Cholesterol: 165 mg/dL (ref 0–200)
HDL: 39.1 mg/dL (ref >39.00)
LDL Cholesterol: 98 mg/dL (ref 0–99)
NonHDL: 126.37
Total CHOL/HDL Ratio: 4
Triglycerides: 141 mg/dL (ref 0.0–149.0)
VLDL: 28.2 mg/dL (ref 0.0–40.0)

## 2024-04-07 LAB — T4, FREE: Free T4: 1.12 ng/dL (ref 0.60–1.60)

## 2024-04-07 LAB — COMPREHENSIVE METABOLIC PANEL WITH GFR
ALT: 11 U/L (ref 0–35)
AST: 16 U/L (ref 0–37)
Albumin: 4.3 g/dL (ref 3.5–5.2)
Alkaline Phosphatase: 75 U/L (ref 39–117)
BUN: 27 mg/dL — ABNORMAL HIGH (ref 6–23)
CO2: 27 meq/L (ref 19–32)
Calcium: 10.3 mg/dL (ref 8.4–10.5)
Chloride: 104 meq/L (ref 96–112)
Creatinine, Ser: 1.18 mg/dL (ref 0.40–1.20)
GFR: 44.34 mL/min — ABNORMAL LOW (ref >60.00)
Glucose, Bld: 91 mg/dL (ref 70–99)
Potassium: 4.9 meq/L (ref 3.5–5.1)
Sodium: 138 meq/L (ref 135–145)
Total Bilirubin: 0.5 mg/dL (ref 0.2–1.2)
Total Protein: 7.6 g/dL (ref 6.0–8.3)

## 2024-04-07 LAB — VITAMIN D 25 HYDROXY (VIT D DEFICIENCY, FRACTURES): VITD: 89.4 ng/mL (ref 30.00–100.00)

## 2024-04-07 LAB — TSH: TSH: 2.07 u[IU]/mL (ref 0.35–5.50)

## 2024-04-11 ENCOUNTER — Ambulatory Visit: Payer: Self-pay | Admitting: Family Medicine

## 2024-04-14 ENCOUNTER — Ambulatory Visit: Payer: Medicare Other | Admitting: Family Medicine

## 2024-04-14 ENCOUNTER — Encounter: Payer: Self-pay | Admitting: Family Medicine

## 2024-04-14 VITALS — BP 110/60 | HR 63 | Temp 98.3°F | Ht 61.0 in | Wt 127.0 lb

## 2024-04-14 DIAGNOSIS — Z7189 Other specified counseling: Secondary | ICD-10-CM

## 2024-04-14 DIAGNOSIS — R7989 Other specified abnormal findings of blood chemistry: Secondary | ICD-10-CM | POA: Diagnosis not present

## 2024-04-14 DIAGNOSIS — M545 Low back pain, unspecified: Secondary | ICD-10-CM | POA: Diagnosis not present

## 2024-04-14 DIAGNOSIS — F5104 Psychophysiologic insomnia: Secondary | ICD-10-CM | POA: Diagnosis not present

## 2024-04-14 DIAGNOSIS — M810 Age-related osteoporosis without current pathological fracture: Secondary | ICD-10-CM | POA: Diagnosis not present

## 2024-04-14 DIAGNOSIS — H9313 Tinnitus, bilateral: Secondary | ICD-10-CM

## 2024-04-14 DIAGNOSIS — R8290 Unspecified abnormal findings in urine: Secondary | ICD-10-CM | POA: Diagnosis not present

## 2024-04-14 DIAGNOSIS — E876 Hypokalemia: Secondary | ICD-10-CM | POA: Diagnosis not present

## 2024-04-14 DIAGNOSIS — I10 Essential (primary) hypertension: Secondary | ICD-10-CM

## 2024-04-14 DIAGNOSIS — E7800 Pure hypercholesterolemia, unspecified: Secondary | ICD-10-CM | POA: Diagnosis not present

## 2024-04-14 DIAGNOSIS — R011 Cardiac murmur, unspecified: Secondary | ICD-10-CM | POA: Diagnosis not present

## 2024-04-14 DIAGNOSIS — E559 Vitamin D deficiency, unspecified: Secondary | ICD-10-CM

## 2024-04-14 DIAGNOSIS — H9193 Unspecified hearing loss, bilateral: Secondary | ICD-10-CM

## 2024-04-14 DIAGNOSIS — N289 Disorder of kidney and ureter, unspecified: Secondary | ICD-10-CM

## 2024-04-14 LAB — POC URINALSYSI DIPSTICK (AUTOMATED)
Bilirubin, UA: NEGATIVE
Glucose, UA: NEGATIVE
Ketones, UA: NEGATIVE
Nitrite, UA: NEGATIVE
Protein, UA: NEGATIVE
Spec Grav, UA: 1.01 (ref 1.010–1.025)
Urobilinogen, UA: 0.2 U/dL
pH, UA: 6 (ref 5.0–8.0)

## 2024-04-14 MED ORDER — VITAMIN D (ERGOCALCIFEROL) 1.25 MG (50000 UNIT) PO CAPS: 50000.0000 [IU] | ORAL_CAPSULE | ORAL | 3 refills | Status: DC

## 2024-04-14 MED ORDER — LOSARTAN POTASSIUM 50 MG PO TABS: 50.0000 mg | ORAL_TABLET | Freq: Every day | ORAL | 3 refills | Status: AC

## 2024-04-14 MED ORDER — ATORVASTATIN CALCIUM 10 MG PO TABS: 10.0000 mg | ORAL_TABLET | Freq: Every day | ORAL | 3 refills | Status: AC

## 2024-04-14 MED ORDER — PROPRANOLOL HCL ER 60 MG PO CP24: 60.0000 mg | ORAL_CAPSULE | Freq: Every day | ORAL | 3 refills | Status: AC

## 2024-04-14 MED ORDER — ALPRAZOLAM 0.5 MG PO TABS: 0.5000 mg | ORAL_TABLET | Freq: Every evening | ORAL | 1 refills | Status: AC | PRN

## 2024-04-14 MED ORDER — SPIRONOLACTONE 25 MG PO TABS: 12.5000 mg | ORAL_TABLET | Freq: Every day | ORAL | 3 refills | Status: AC

## 2024-04-14 MED ORDER — AMLODIPINE BESYLATE 5 MG PO TABS: 5.0000 mg | ORAL_TABLET | Freq: Every day | ORAL | 3 refills | Status: AC

## 2024-04-14 NOTE — Assessment & Plan Note (Signed)
 Mild, will continue to monitor.

## 2024-04-14 NOTE — Assessment & Plan Note (Signed)
 Well repleted with weekly replacement

## 2024-04-14 NOTE — Assessment & Plan Note (Signed)
 Story/exam most consistent with lumbar strain.  Rec gentle stretching, LBP exercises provided, continue PRN tylenol , update if new or worsening symptoms develop.   Check UA in setting of low back pain and new renal insuff - abnormal with LE, UCx sent.

## 2024-04-14 NOTE — Assessment & Plan Note (Signed)
 Chronic, BP great control but now with worsening kidney function - will drop spironolactone  to 12.5mg  daily and reassess as per below.

## 2024-04-14 NOTE — Assessment & Plan Note (Addendum)
 Continue PRN xanax .  Melatonin , trazodone  didn't help previously.  Predominantly sleep initiation insomnia.  Tinnitus contributes to insomnia.  She will retry melatonin

## 2024-04-14 NOTE — Assessment & Plan Note (Signed)
 TFTs remain normal.

## 2024-04-14 NOTE — Assessment & Plan Note (Signed)
 Previously discussed - scanned last year

## 2024-04-14 NOTE — Assessment & Plan Note (Signed)
 New worsening noted in setting of spironolactone  use and recent low BP readings - will drop sprionolactone to 12.5mg  daily and recheck Cr 1 month. Update UA today - abnormal suspicious for UTI so UCx sent - see below.

## 2024-04-14 NOTE — Assessment & Plan Note (Signed)
 Regularly uses hearing aides.

## 2024-04-14 NOTE — Patient Instructions (Addendum)
 Urinalysis today  For lower back  try exercises provided today, may continue aspercream and gentle stretching, let us   know if not improving with this  Let me know when you complete dental work to discuss osteoporosis medicines.  Use mineral sunscreens not chemical  Drop spironolactone  to 1/2 tablet daily (12.5mg ) and recheck blood work in 1 month. Return in 3 months for blood pressure follow up visit  Good to see you today

## 2024-04-14 NOTE — Assessment & Plan Note (Signed)
 Better on daily spironolactone . See above re med changes.

## 2024-04-14 NOTE — Assessment & Plan Note (Signed)
 Reviewed latest DEXA scores with patient, she is regular with dietary calcium  intake, weekly vitamin D  replacement, and frequent weight bearing exercise. She currently continues to undergo dental work pending implants - will let me know when this is complete to consider osteoporosis medication.

## 2024-04-14 NOTE — Assessment & Plan Note (Addendum)
 Chronic, well controlled on low dose atorvastatin  - continue.  The 10-year ASCVD risk score (Arnett DK, et al., 2019) is: 20.9%   Values used to calculate the score:     Age: 78 years     Clincally relevant sex: Female     Is Non-Hispanic African American: No     Diabetic: No     Tobacco smoker: No     Systolic Blood Pressure: 110 mmHg     Is BP treated: Yes     HDL Cholesterol: 39.1 mg/dL     Total Cholesterol: 165 mg/dL

## 2024-04-14 NOTE — Assessment & Plan Note (Signed)
 Ongoing, chronic issue. Wearing hearing aides. This contributes to insomnia.

## 2024-04-14 NOTE — Progress Notes (Signed)
 Ph: [phone] Fax: [phone]   Patient ID: Robin Peck, female    DOB: Jun 18, 1946, 78 y.o.   MRN: [id number]  This visit was conducted in person.  BP 110/60   Pulse 63   Temp 98.3 F (36.8 C) (Oral)   Ht 5' 1 (1.549 m)   Wt 127 lb (57.6 kg)   SpO2 97%   BMI 24.00 kg/m   BP Readings from Last 3 Encounters:  04/14/24 110/60  03/29/24 134/62  10/15/23 134/62   CC: CPE Subjective:   HPI: MURLINE Peck is a 78 y.o. female presenting on 04/14/2024 for Annual Exam   Saw health advisor 03/2024 for medicare wellness visit. Note reviewed.   No results found.  Flowsheet Row Office Visit from 04/14/2024 in [facility] [place]  PHQ-2 Total Score 0       04/14/2024   12:52 PM 03/29/2024    1:53 PM 10/15/2023   10:57 AM 07/28/2023    9:29 AM 04/14/2023    9:00 AM  Fall Risk   Falls in the past year? 0 0 0 0 0  Number falls in past yr: 0 0     Injury with Fall?  0     Risk for fall due to : No Fall Risks No Fall Risks     Follow up  Falls evaluation completed     She stopped aspirin   3 wks ago developed lower back pain without inciting trauma/injury or falls. Worse when bending down. Treating with advil with some benefit. No UTI symptoms. Did have tick bite 2 months ago - attached less than a few hours. No pain with walking, more noted when she lays down to sleep. Wonders if she needs to change her mattress.   Ongoing dental work through [employer]. Had trouble with getting sufficient bone graft - planned temporary teeth prior to dental implants.   Osteoporosis - she checked with dentist re prolia vs fosamax - rec waiting until dental work completed first. She continues weekly vit D 50k units. She gets good daily dietary calcium . Walking regularly on treadmill.   HTN - continues amlodipine  5mg  QAM daily, losartan  50mg  at bedtime, spironolactone  25mg  daily. Also on propranolol  LA 60mg  daily. Home BPs running well controlled 110-125/50s.  Nadir BP 99/55. Occ dizziness/lightheadedness. Worsened kidney function noted.   Residual burning occasionally stabbing discomfort to left chest with radiation to axilla - at site of previous shingles, presumed PHN. More trouble with hot weather.  Pregabalin  stopped 01/2021.   Ongoing difficulty with insomnia - manages with PRN xanax . She has tried trazodone , melatonin, Relaxium without any long lasting benefit.  R>L chronic tinnitus for years. Insomnia worsens tinnitus. Hearing aides help during the day. Uses cotton balls to sleep at night.    Brothers x2 passed away from MI, latest 3 yrs ago.   Preventative: Colonoscopy 03/2020 - 3.5 cm polyp - rec return for removal  Colonoscopy 06/2020 - full piecemeal removal - SSP and TA - rec rpt 6-9 months (Mansouraty)  Colonoscopy 07/2022 - benign polyp, hemorrhoids, rpt 3 yrs given large adenoma hx (Mansouraty, Aloha Raddle., MD)  Mammogram 07/2023 - Birads1 @ [place]  Well woman exam >10 yrs ago - has not seen GYN recently. Denies pelvic pain, vaginal bleeding.  DEXA 04/2020: T score -2.7 spine, -2.6 L hip (osteoporosis) - declined meds previously DEXA 07/2023 - T -3.3 spine, -2.6 LFN, -2.8 L forearm - see above Lung cancer screening - not  eligible Flu shot - 08/2020 COVID vaccine - didn't receive  Prevnar-13 2017 - didn't feel well after this. Pneumovax23 04/2022 Td 2017  Shingrix - 10/2019, 01/2020 RSV - to consider through pharmacy Advanced directive discussion - brought, scanned 04/2023. Husband is HCPOA - no HCPOA form filled out. ok with feeding tube. No prolonged life support if terminal condition.  Seat belt use discussed Sunscreen use discussed. No changing moles on skin.  Sleep - averaging 6-7 hours/night  Non smoker Alcohol - none Dentist - seeing regularly - planned dental implants  Eye exam - yearly - s/p cataract extraction summer 2023 Bowel - no constipation  Bladder - some stress incontinence, not bothersome   Lives with  husband Christopher.  Activity: exercises on treadmill 3x/wk  Diet: good water, fruits/vegetables daily      Relevant past medical, surgical, family and social history reviewed and updated as indicated. Interim medical history since our last visit reviewed. Allergies and medications reviewed and updated. Outpatient Medications Prior to Visit  Medication Sig Dispense Refill   ascorbic acid  (VITAMIN C) 500 MG tablet Take 500 mg by mouth daily in the afternoon.     cetirizine  (ZYRTEC ) 10 MG tablet Take 1 tablet (10 mg total) by mouth daily as needed (seasonal allergies.). (Patient taking differently: Take 10 mg by mouth in the morning.)     famotidine  (PEPCID ) 20 MG tablet Take 1 tablet (20 mg total) by mouth daily as needed for heartburn or indigestion.     ibuprofen (ADVIL) 200 MG tablet Take 2 tablets (400 mg total) by mouth 2 (two) times daily as needed for moderate pain.     Zinc  30 MG CAPS Take 1 capsule (30 mg total) by mouth daily. (Patient taking differently: Take 1 capsule by mouth daily in the afternoon.) 30 capsule    ALPRAZolam  (XANAX ) 0.5 MG tablet TAKE 1 TABLET BY MOUTH EVERY NIGHT AT BEDTIME AS NEEDED FOR SLEEP 30 tablet 1   amLODipine  (NORVASC ) 5 MG tablet TAKE 1 TABLET BY MOUTH DAILY 90 tablet 0   atorvastatin  (LIPITOR ) 10 MG tablet Take 1 tablet (10 mg total) by mouth daily. 90 tablet 4   Cholecalciferol (VITAMIN D3) 1.25 MG (50000 UT) TABS Take 1 tablet by mouth once a week. 12 tablet 4   losartan  (COZAAR ) 50 MG tablet TAKE 1 TABLET BY MOUTH DAILY 90 tablet 1   OVER THE COUNTER MEDICATION Take 1 tablet by mouth daily in the afternoon. SUPER BEETS     propranolol  ER (INDERAL  LA) 60 MG 24 hr capsule Take 1 capsule (60 mg total) by mouth daily. 90 capsule 4   spironolactone  (ALDACTONE ) 25 MG tablet Take 1 tablet (25 mg total) by mouth daily. 90 tablet 1   aspirin  81 MG chewable tablet Chew 81 mg by mouth 2 (two) times daily.     No facility-administered medications prior to visit.      Per HPI unless specifically indicated in ROS section below Review of Systems  Objective:  BP 110/60   Pulse 63   Temp 98.3 F (36.8 C) (Oral)   Ht 5' 1 (1.549 m)   Wt 127 lb (57.6 kg)   SpO2 97%   BMI 24.00 kg/m   Wt Readings from Last 3 Encounters:  04/14/24 127 lb (57.6 kg)  03/29/24 125 lb (56.7 kg)  10/15/23 129 lb 4 oz (58.6 kg)      Physical Exam Vitals and nursing note reviewed.  Constitutional:      Appearance: Normal appearance.  She is not ill-appearing.  HENT:     Head: Normocephalic and atraumatic.     Right Ear: Tympanic membrane, ear canal and external ear normal. There is no impacted cerumen.     Left Ear: Tympanic membrane, ear canal and external ear normal. There is no impacted cerumen.  Eyes:     General:        Right eye: No discharge.        Left eye: No discharge.     Extraocular Movements: Extraocular movements intact.     Conjunctiva/sclera: Conjunctivae normal.     Pupils: Pupils are equal, round, and reactive to light.  Neck:     Thyroid : No thyroid  mass or thyromegaly.  Cardiovascular:     Rate and Rhythm: Normal rate and regular rhythm.     Pulses: Normal pulses.     Heart sounds: Murmur (2/6 systolic murmur) heard.  Pulmonary:     Effort: Pulmonary effort is normal. No respiratory distress.     Breath sounds: No wheezing, rhonchi or rales.     Comments: LLL crackles Abdominal:     General: Bowel sounds are normal. There is no distension.     Palpations: Abdomen is soft. There is no mass.     Tenderness: There is no abdominal tenderness. There is no guarding or rebound.     Hernia: No hernia is present.  Musculoskeletal:     Cervical back: Normal range of motion and neck supple. No rigidity.     Right lower leg: No edema.     Left lower leg: No edema.     Comments:  No pain midline spine + discomfort with L>R mid to lower lumbar paraspinous mm tenderness Neg SLR bilaterally. No pain with int/ext rotation at hip. No significant  pain at SIJ, GTB or sciatic notch bilaterally.   Lymphadenopathy:     Cervical: No cervical adenopathy.  Skin:    General: Skin is warm and dry.     Findings: No rash.  Neurological:     General: No focal deficit present.     Mental Status: She is alert. Mental status is at baseline.     Comments:  5/5 strength BLE Sensation intact BLE  Psychiatric:        Mood and Affect: Mood normal.        Behavior: Behavior normal.       Results for orders placed or performed in visit on 04/14/24  POCT Urinalysis Dipstick (Automated)   Collection Time: 04/14/24  1:01 PM  Result Value Ref Range   Color, UA yellow    Clarity, UA clear    Glucose, UA Negative Negative   Bilirubin, UA negative    Ketones, UA negative    Spec Grav, UA 1.010 1.010 - 1.025   Blood, UA 1+    pH, UA 6.0 5.0 - 8.0   Protein, UA Negative Negative   Urobilinogen, UA 0.2 0.2 or 1.0 E.U./dL   Nitrite, UA negative    Leukocytes, UA Moderate (2+) (A) Negative    Assessment & Plan:   Problem List Items Addressed This Visit     Advanced care planning/counseling discussion - Primary (Chronic)   Previously discussed - scanned last year      HLD (hyperlipidemia)   Chronic, well controlled on low dose atorvastatin  - continue.  The 10-year ASCVD risk score (Arnett DK, et al., 2019) is: 20.9%   Values used to calculate the score:     Age: 23 years  Clincally relevant sex: Female     Is Non-Hispanic African American: No     Diabetic: No     Tobacco smoker: No     Systolic Blood Pressure: 110 mmHg     Is BP treated: Yes     HDL Cholesterol: 39.1 mg/dL     Total Cholesterol: 165 mg/dL       Relevant Medications   amLODipine  (NORVASC ) 5 MG tablet   atorvastatin  (LIPITOR ) 10 MG tablet   losartan  (COZAAR ) 50 MG tablet   propranolol  ER (INDERAL  LA) 60 MG 24 hr capsule   spironolactone  (ALDACTONE ) 25 MG tablet   Essential hypertension   Chronic, BP great control but now with worsening kidney function - will  drop spironolactone  to 12.5mg  daily and reassess as per below.       Relevant Medications   amLODipine  (NORVASC ) 5 MG tablet   atorvastatin  (LIPITOR ) 10 MG tablet   losartan  (COZAAR ) 50 MG tablet   propranolol  ER (INDERAL  LA) 60 MG 24 hr capsule   spironolactone  (ALDACTONE ) 25 MG tablet   Chronic hypokalemia   Better on daily spironolactone . See above re med changes.       Tinnitus aurium, bilateral   Ongoing, chronic issue. Wearing hearing aides. This contributes to insomnia.       Chronic insomnia   Continue PRN xanax .  Melatonin , trazodone  didn't help previously.  Predominantly sleep initiation insomnia.  Tinnitus contributes to insomnia.  She will retry melatonin       Relevant Medications   ALPRAZolam  (XANAX ) 0.5 MG tablet   Osteoporosis   Reviewed latest DEXA scores with patient, she is regular with dietary calcium  intake, weekly vitamin D  replacement, and frequent weight bearing exercise. She currently continues to undergo dental work pending implants - will let me know when this is complete to consider osteoporosis medication.       Relevant Medications   Vitamin D , Ergocalciferol , (DRISDOL ) 1.25 MG (50000 UNIT) CAPS capsule   Abnormal TSH   TFTs remain normal.       Bilateral hearing loss   Regularly uses hearing aides.       Vitamin D  deficiency   Well repleted with weekly replacement      Systolic murmur   Mild, will continue to monitor.       Renal insufficiency   New worsening noted in setting of spironolactone  use and recent low BP readings - will drop sprionolactone to 12.5mg  daily and recheck Cr 1 month. Update UA today - abnormal suspicious for UTI so UCx sent - see below.       Relevant Orders   Renal function panel   POCT Urinalysis Dipstick (Automated) (Completed)   Acute midline low back pain without sciatica   Story/exam most consistent with lumbar strain.  Rec gentle stretching, LBP exercises provided, continue PRN tylenol , update if  new or worsening symptoms develop.   Check UA in setting of low back pain and new renal insuff - abnormal with LE, UCx sent.      Relevant Orders   Urine Culture   Other Visit Diagnoses       Abnormal urinalysis       Relevant Orders   Urine Culture        Meds ordered this encounter  Medications   amLODipine  (NORVASC ) 5 MG tablet    Sig: Take 1 tablet (5 mg total) by mouth daily.    Dispense:  90 tablet    Refill:  3   atorvastatin  (  LIPITOR ) 10 MG tablet    Sig: Take 1 tablet (10 mg total) by mouth daily.    Dispense:  90 tablet    Refill:  3   losartan  (COZAAR ) 50 MG tablet    Sig: Take 1 tablet (50 mg total) by mouth daily.    Dispense:  90 tablet    Refill:  3   propranolol  ER (INDERAL  LA) 60 MG 24 hr capsule    Sig: Take 1 capsule (60 mg total) by mouth daily.    Dispense:  90 capsule    Refill:  3   spironolactone  (ALDACTONE ) 25 MG tablet    Sig: Take 0.5 tablets (12.5 mg total) by mouth daily.    Dispense:  45 tablet    Refill:  3    Note new dose   Vitamin D , Ergocalciferol , (DRISDOL ) 1.25 MG (50000 UNIT) CAPS capsule    Sig: Take 1 capsule (50,000 Units total) by mouth every 7 (seven) days.    Dispense:  12 capsule    Refill:  3   ALPRAZolam  (XANAX ) 0.5 MG tablet    Sig: Take 1 tablet (0.5 mg total) by mouth at bedtime as needed. for sleep    Dispense:  30 tablet    Refill:  1    Orders Placed This Encounter  Procedures   Urine Culture   Renal function panel    Standing Status:   Future    Expiration Date:   04/14/2025   POCT Urinalysis Dipstick (Automated)    Patient Instructions  Urinalysis today  For lower back  try exercises provided today, may continue aspercream and gentle stretching, let us   know if not improving with this  Let me know when you complete dental work to discuss osteoporosis medicines.  Use mineral sunscreens not chemical  Drop spironolactone  to 1/2 tablet daily (12.5mg ) and recheck blood work in 1 month. Return in 3 months  for blood pressure follow up visit  Good to see you today   Follow up plan: Return in about 3 months (around 07/15/2024), or if symptoms worsen or fail to improve, for follow up visit.  Anton Blas, MD

## 2024-04-17 ENCOUNTER — Other Ambulatory Visit: Payer: Self-pay | Admitting: Family Medicine

## 2024-04-17 DIAGNOSIS — E7800 Pure hypercholesterolemia, unspecified: Secondary | ICD-10-CM

## 2024-04-17 LAB — URINE CULTURE
MICRO NUMBER:: 16826083
SPECIMEN QUALITY:: ADEQUATE

## 2024-04-19 ENCOUNTER — Ambulatory Visit: Payer: Self-pay | Admitting: Family Medicine

## 2024-04-19 MED ORDER — NITROFURANTOIN MONOHYD MACRO 100 MG PO CAPS: 100.0000 mg | ORAL_CAPSULE | Freq: Two times a day (BID) | ORAL | 0 refills | Status: AC

## 2024-05-05 ENCOUNTER — Other Ambulatory Visit: Payer: Self-pay | Admitting: Family Medicine

## 2024-05-05 NOTE — Telephone Encounter (Signed)
 Too soon. Rx sent 04/14/24, #45/3 refills to [facility].  Request denied.

## 2024-05-17 ENCOUNTER — Other Ambulatory Visit (INDEPENDENT_AMBULATORY_CARE_PROVIDER_SITE_OTHER)

## 2024-05-17 DIAGNOSIS — N289 Disorder of kidney and ureter, unspecified: Secondary | ICD-10-CM

## 2024-05-17 LAB — RENAL FUNCTION PANEL
Albumin: 4.2 g/dL (ref 3.5–5.2)
BUN: 21 mg/dL (ref 6–23)
CO2: 27 meq/L (ref 19–32)
Calcium: 10.2 mg/dL (ref 8.4–10.5)
Chloride: 106 meq/L (ref 96–112)
Creatinine, Ser: 0.98 mg/dL (ref 0.40–1.20)
GFR: 55.37 mL/min — ABNORMAL LOW (ref >60.00)
Glucose, Bld: 94 mg/dL (ref 70–99)
Phosphorus: 4 mg/dL (ref 2.3–4.6)
Potassium: 4.3 meq/L (ref 3.5–5.1)
Sodium: 139 meq/L (ref 135–145)

## 2024-06-23 ENCOUNTER — Other Ambulatory Visit: Payer: Self-pay | Admitting: Family Medicine

## 2024-06-23 DIAGNOSIS — E559 Vitamin D deficiency, unspecified: Secondary | ICD-10-CM

## 2024-06-24 NOTE — Telephone Encounter (Signed)
 Has finished her script ok to refill or does she need to start otc daily?

## 2024-06-25 NOTE — Telephone Encounter (Signed)
 Called patient reviewed all information and repeated back to me. Will call if any questions.  ? ?

## 2024-06-25 NOTE — Telephone Encounter (Signed)
 I'd like to drop dose to every 2 weeks.  Plz notify pt. Rx with new sig sent in.

## 2024-07-15 DIAGNOSIS — Z23 Encounter for immunization: Secondary | ICD-10-CM | POA: Diagnosis not present

## 2024-07-16 ENCOUNTER — Ambulatory Visit (INDEPENDENT_AMBULATORY_CARE_PROVIDER_SITE_OTHER): Admitting: Family Medicine

## 2024-07-16 ENCOUNTER — Encounter: Payer: Self-pay | Admitting: Family Medicine

## 2024-07-16 VITALS — BP 130/62 | HR 62 | Temp 97.9°F | Ht 61.0 in | Wt 126.4 lb

## 2024-07-16 DIAGNOSIS — F5104 Psychophysiologic insomnia: Secondary | ICD-10-CM

## 2024-07-16 DIAGNOSIS — I10 Essential (primary) hypertension: Secondary | ICD-10-CM

## 2024-07-16 DIAGNOSIS — M810 Age-related osteoporosis without current pathological fracture: Secondary | ICD-10-CM | POA: Diagnosis not present

## 2024-07-16 DIAGNOSIS — E559 Vitamin D deficiency, unspecified: Secondary | ICD-10-CM

## 2024-07-16 NOTE — Addendum Note (Signed)
 Addended by: RILLA BALLER on: 07/16/2024 10:26 AM   Modules accepted: Level of Service

## 2024-07-16 NOTE — Assessment & Plan Note (Addendum)
 Discussed options - declines additional treatment at this time.  Sleep hygiene checklist provided today.

## 2024-07-16 NOTE — Assessment & Plan Note (Signed)
 Currently on 50k Rx q2 weeks - update levels next physical labwork  in 2026.

## 2024-07-16 NOTE — Patient Instructions (Addendum)
 Bedtime routine checklist: 1. Avoid naps during the day 2. Avoid stimulants such as caffeine  and nicotine. Avoid bedtime alcohol (it can speed onset of sleep but the body's metabolism can cause awakenings). 3. All forms of exercise help ensure sound sleep - limit vigorous exercise to morning or late afternoon 4. Avoid food too close to bedtime including chocolate (which contains caffeine ) 5. Soak up natural light 6. Establish regular bedtime routine. 7. Associate bed with sleep - avoid TV, computer or phone, reading while in bed. 8. Ensure pleasant, relaxing sleep environment - quiet, dark, cool room.   Schedule eye exam as you're due.  Continue current medicines. Good to see you today Return as needed or for next wellness visit in August.

## 2024-07-16 NOTE — Assessment & Plan Note (Signed)
Chronic, well controlled on current regimen - continue.  

## 2024-07-16 NOTE — Assessment & Plan Note (Addendum)
 Continues dental work - will defer osteoporosis medication at this time.

## 2024-07-16 NOTE — Progress Notes (Signed)
 Ph: [phone] Fax: [phone]   Patient ID: Robin Peck, female    DOB: Mar 04, 1946, 78 y.o.   MRN: [id number]  This visit was conducted in person.  BP 130/62   Pulse 62   Temp 97.9 F (36.6 C) (Oral)   Ht 5' 1 (1.549 m)   Wt 126 lb 6 oz (57.3 kg)   SpO2 97%   BMI 23.88 kg/m   BP Readings from Last 3 Encounters:  07/16/24 130/62  04/14/24 110/60  03/29/24 134/62   CC: 3 mo f/u visit  Subjective:   HPI: Robin Peck is a 78 y.o. female presenting on 07/16/2024 for Medical Management of Chronic Issues (Pt here for 3 mth HTN and kidney f/u/)   Osteoporosis - she checked with dentist re prolia vs fosamax - rec waiting until dental work completed first. Dental work continues - next appt is Monday. She continues q2 week vit D 50k international units . She gets good daily dietary calcium . Walking regularly on treadmill.  DEXA 07/2023 - T -3.3 spine, -2.6 LFN, -2.8 L forearm  HTN - Compliant with current antihypertensive regimen of amlodipine  5mg  daily, losartan  50mg  daily, spironolactone  12.5mg  daily and propranolol  60mg  daily. Does check blood pressures at home: 110s-130s/50s. No low blood pressure readings or symptoms of dizziness/syncope.  Denies HA, vision changes, CP/tightness, SOB, leg swelling.   Ongoing difficulty with insomnia (both initiation and maintenance issues) - manages with PRN xanax . She has tried trazodone , melatonin, Relaxium without any long lasting benefit. She tried herbal teas including valerian root without benefit. She's started using chamomile tea but this hasn't helped. She doesn't watch TV.   Notes intermittent hot flashes that started after COVID infection 2020.  Overdue for eye exam.      Relevant past medical, surgical, family and social history reviewed and updated as indicated. Interim medical history since our last visit reviewed. Allergies and medications reviewed and updated. Outpatient Medications Prior to Visit  Medication Sig  Dispense Refill   ALPRAZolam  (XANAX ) 0.5 MG tablet Take 1 tablet (0.5 mg total) by mouth at bedtime as needed. for sleep 30 tablet 1   amLODipine  (NORVASC ) 5 MG tablet Take 1 tablet (5 mg total) by mouth daily. 90 tablet 3   ascorbic acid  (VITAMIN C) 500 MG tablet Take 500 mg by mouth daily in the afternoon.     atorvastatin  (LIPITOR ) 10 MG tablet Take 1 tablet (10 mg total) by mouth daily. 90 tablet 3   cetirizine  (ZYRTEC ) 10 MG tablet Take 1 tablet (10 mg total) by mouth daily as needed (seasonal allergies.). (Patient taking differently: Take 10 mg by mouth in the morning.)     Cholecalciferol (VITAMIN D3) 1.25 MG (50000 UT) CAPS Take 1 capsule (1.25 mg total) by mouth every 14 (fourteen) days. 6 capsule 3   famotidine  (PEPCID ) 20 MG tablet Take 1 tablet (20 mg total) by mouth daily as needed for heartburn or indigestion.     ibuprofen (ADVIL) 200 MG tablet Take 2 tablets (400 mg total) by mouth 2 (two) times daily as needed for moderate pain.     losartan  (COZAAR ) 50 MG tablet Take 1 tablet (50 mg total) by mouth daily. 90 tablet 3   propranolol  ER (INDERAL  LA) 60 MG 24 hr capsule Take 1 capsule (60 mg total) by mouth daily. 90 capsule 3   spironolactone  (ALDACTONE ) 25 MG tablet Take 0.5 tablets (12.5 mg total) by mouth daily. 45 tablet 3   Zinc  30 MG CAPS  Take 1 capsule (30 mg total) by mouth daily. (Patient taking differently: Take 1 capsule by mouth daily in the afternoon.) 30 capsule    No facility-administered medications prior to visit.     Per HPI unless specifically indicated in ROS section below Review of Systems  Objective:  BP 130/62   Pulse 62   Temp 97.9 F (36.6 C) (Oral)   Ht 5' 1 (1.549 m)   Wt 126 lb 6 oz (57.3 kg)   SpO2 97%   BMI 23.88 kg/m   Wt Readings from Last 3 Encounters:  07/16/24 126 lb 6 oz (57.3 kg)  04/14/24 127 lb (57.6 kg)  03/29/24 125 lb (56.7 kg)      Physical Exam Vitals and nursing note reviewed.  Constitutional:      Appearance: Normal  appearance. She is not ill-appearing.  HENT:     Head: Normocephalic and atraumatic.     Mouth/Throat:     Mouth: Mucous membranes are moist.     Pharynx: Oropharynx is clear. No oropharyngeal exudate or posterior oropharyngeal erythema.  Eyes:     Extraocular Movements: Extraocular movements intact.     Pupils: Pupils are equal, round, and reactive to light.  Cardiovascular:     Rate and Rhythm: Normal rate and regular rhythm.     Pulses: Normal pulses.     Heart sounds: Normal heart sounds. No murmur heard. Pulmonary:     Effort: Pulmonary effort is normal. No respiratory distress.     Breath sounds: Normal breath sounds. No wheezing, rhonchi or rales.  Musculoskeletal:     Right lower leg: No edema.     Left lower leg: No edema.  Skin:    General: Skin is warm and dry.     Findings: No rash.  Neurological:     Mental Status: She is alert.  Psychiatric:        Mood and Affect: Mood normal.        Behavior: Behavior normal.       Results for orders placed or performed in visit on 05/17/24  Renal function panel   Collection Time: 05/17/24  9:08 AM  Result Value Ref Range   Sodium 139 135 - 145 mEq/L   Potassium 4.3 3.5 - 5.1 mEq/L   Chloride 106 96 - 112 mEq/L   CO2 27 19 - 32 mEq/L   Albumin 4.2 3.5 - 5.2 g/dL   BUN 21 6 - 23 mg/dL   Creatinine, Ser 9.01 0.40 - 1.20 mg/dL   Glucose, Bld 94 70 - 99 mg/dL   Phosphorus 4.0 2.3 - 4.6 mg/dL   GFR 44.62 (L) >39.99 mL/min   Calcium  10.2 8.4 - 10.5 mg/dL    Assessment & Plan:   Problem List Items Addressed This Visit     Essential hypertension   Chronic, well controlled on current regimen- continue.       Chronic insomnia - Primary   Discussed options - declines additional treatment at this time.  Sleep hygiene checklist provided today.       Osteoporosis   Continues dental work - will defer osteoporosis medication at this time.       Vitamin D  deficiency   Currently on 50k Rx q2 weeks - update levels next  physical labwork  in 2026.        No orders of the defined types were placed in this encounter.   No orders of the defined types were placed in this encounter.   Patient  Instructions  Bedtime routine checklist: 1. Avoid naps during the day 2. Avoid stimulants such as caffeine  and nicotine. Avoid bedtime alcohol (it can speed onset of sleep but the body's metabolism can cause awakenings). 3. All forms of exercise help ensure sound sleep - limit vigorous exercise to morning or late afternoon 4. Avoid food too close to bedtime including chocolate (which contains caffeine ) 5. Soak up natural light 6. Establish regular bedtime routine. 7. Associate bed with sleep - avoid TV, computer or phone, reading while in bed. 8. Ensure pleasant, relaxing sleep environment - quiet, dark, cool room.   Schedule eye exam as you're due.  Continue current medicines. Good to see you today Return as needed or for next wellness visit in August.   Follow up plan: Return if symptoms worsen or fail to improve.  Anton Blas, MD

## 2025-03-30 ENCOUNTER — Ambulatory Visit

## 2025-04-07 ENCOUNTER — Other Ambulatory Visit

## 2025-04-15 ENCOUNTER — Encounter: Admitting: Family Medicine
